# Patient Record
Sex: Male | Born: 1940 | Race: White | Hispanic: No | Marital: Married | Smoking: Former smoker
Health system: Southern US, Community
[De-identification: ages and names within clinical notes are randomized; demographics above are authoritative.]

## PROBLEM LIST (undated history)

## (undated) DIAGNOSIS — J449 Chronic obstructive pulmonary disease, unspecified: Secondary | ICD-10-CM

## (undated) DIAGNOSIS — G4700 Insomnia, unspecified: Secondary | ICD-10-CM

## (undated) DIAGNOSIS — Z720 Tobacco use: Secondary | ICD-10-CM

## (undated) DIAGNOSIS — L570 Actinic keratosis: Secondary | ICD-10-CM

## (undated) DIAGNOSIS — I10 Essential (primary) hypertension: Secondary | ICD-10-CM

## (undated) DIAGNOSIS — J45909 Unspecified asthma, uncomplicated: Secondary | ICD-10-CM

## (undated) DIAGNOSIS — J302 Other seasonal allergic rhinitis: Secondary | ICD-10-CM

## (undated) HISTORY — PX: SHOULDER SURGERY: SHX246

## (undated) HISTORY — PX: LUMBAR LAMINECTOMY: SHX95

## (undated) HISTORY — PX: INGUINAL HERNIA REPAIR: SUR1180

## (undated) HISTORY — DX: Insomnia, unspecified: G47.00

## (undated) HISTORY — DX: Tobacco use: Z72.0

## (undated) HISTORY — DX: Actinic keratosis: L57.0

## (undated) HISTORY — DX: Other seasonal allergic rhinitis: J30.2

## (undated) HISTORY — DX: Essential (primary) hypertension: I10

## (undated) HISTORY — DX: Chronic obstructive pulmonary disease, unspecified: J44.9

## (undated) HISTORY — PX: BREAST BIOPSY: SHX20

---

## 2008-04-30 ENCOUNTER — Inpatient Hospital Stay (HOSPITAL_COMMUNITY): Admission: RE | Admit: 2008-04-30 | Discharge: 2008-05-04 | Payer: Self-pay | Admitting: Neurosurgery

## 2010-07-31 LAB — CBC
MCHC: 33.4 g/dL (ref 30.0–36.0)
Platelets: 219 10*3/uL (ref 150–400)
RBC: 5.2 MIL/uL (ref 4.22–5.81)
RDW: 13 % (ref 11.5–15.5)

## 2010-07-31 LAB — BASIC METABOLIC PANEL
CO2: 28 mEq/L (ref 19–32)
Calcium: 10.2 mg/dL (ref 8.4–10.5)
Creatinine, Ser: 0.82 mg/dL (ref 0.4–1.5)
GFR calc Af Amer: >60 mL/min (ref >60)
Glucose, Bld: 92 mg/dL (ref 70–99)

## 2010-07-31 LAB — TYPE AND SCREEN
ABO/RH(D): B NEG
Antibody Screen: NEGATIVE

## 2010-08-29 NOTE — Discharge Summary (Signed)
[name], Dominic Willis              ACCOUNT NO.:  [id number]   MEDICAL RECORD NO.:  [id number]          PATIENT TYPE:  INP   LOCATION:  3007                         FACILITY:  [facility]   PHYSICIAN:  Hilda Lias, M.D.   DATE OF BIRTH:  04/10/1941   DATE OF ADMISSION:  04/30/2008  DATE OF DISCHARGE:  05/04/2008                               DISCHARGE SUMMARY   ADMISSION DIAGNOSIS:  Lumbar stenosis with neurogenic claudication.   FINAL DIAGNOSIS:  Lumbar stenosis with neurogenic claudication.   CLINICAL HISTORY:  The patient was admitted because of back pain with  radiation to both legs.  The patient get worse every time he walks.  He  gets better with he sits.  X-ray shows stenosis from L2 through L4-5.  Surgery was advised.   LABORATORY DATA:  Normal.   COURSE IN THE HOSPITAL:  The patient was taken to surgery and  decompressive laminectomy was done with posterolateral arthrodesis.  Today, he is doing much better, he is afebrile, he is having minimal  discomfort, and he is ready to go home.   CONDITION ON DISCHARGE:  Improvement.   MEDICATIONS:  Percocet, diazepam.   DIET:  Regular.   ACTIVITY:  Not to drive for a week.   Follow up to see me in 2 weeks or as needed.           ______________________________  Hilda Lias, M.D.     EB/MEDQ  D:  05/04/2008  T:  05/04/2008  Job:  [id number]

## 2010-08-29 NOTE — Op Note (Signed)
NAMESHERVIN, Willis              ACCOUNT NO.:  [id number]   MEDICAL RECORD NO.:  [id number]          PATIENT TYPE:  INP   LOCATION:  2899                         FACILITY:  [facility]   PHYSICIAN:  Hilda Lias, M.D.   DATE OF BIRTH:  08-May-1940   DATE OF PROCEDURE:  04/30/2008  DATE OF DISCHARGE:                               OPERATIVE REPORT   PREOPERATIVE DIAGNOSIS:  Lumbar stenosis with neurogenic claudication L2  through L4-5.   POSTOPERATIVE DIAGNOSIS:  Lumbar stenosis with neurogenic claudication  L2 through L4-5.   PROCEDURE:  Bilateral 2, 3, and 4 laminectomies, foraminotomies 2-3, 3-  4, 4-5.  Posterolateral arthrodesis with autograft.  Microscope.   SURGEON:  Hilda Lias, MD   CLINICAL HISTORY:  The patient is a 70 year old gentleman complaining of  back pain radiating to both legs.  X-rays showed degenerative disk  disease with stenosis at the level of 2-3, 3-4, 4-5.  Surgery was  advised.  The patient knew the risks of infection, CSF leak, worsening  pain, paralysis, need of further surgery.   PROCEDURE:  The patient was taken to the OR and after intubation, he was  positioned in prone manner.  The skin was cleaned with DuraPrep.  Midline incision from L2 to L4-5 was made.  A muscle was retracted all  the way laterally until we were able to see the takeoff of the  transverse process of 2, 3, 4 and 5.  Then with the Leksell, we removed  the spinous process of the 2, 3, and 4.  The patient had probably a  stenosis, and we had to use the drill to remove the rest of the lamina.  Bilateral laminectomy was performed.  The patient had quite a bit of  thickening of the yellow ligament, which was already calcified at this  level, the worst was at the level of L4-5.  Using the 2-3 mm Kerrison  punch as well as the drill, we were able to decompress the thecal sac at  those 3 levels.  Then using the 2 and 3 and 4 mm Kerrison punch, we did  a foraminotomy to decompress the  L2, L3, L4, and L5 nerve root.  There  was an area of adhesion mostly at the level of 4-5 to the left.  Although there was no evidence of any CSF leak, there was quite a bit of  filling of the dura matter in that area and two single stitches of  Prolene was done.  Valsalva maneuver was negative.  Having done this  with the drill, we removed the periosteum of the lateral transverse  process of 234 as well as the lateral facet.  Then the bone which was  safe from the decompression was used for arthrodesis.  Then, all of the  area was irrigated and the wound was closed with Vicryl and Steri-  Strips.           ______________________________  Hilda Lias, M.D.     EB/MEDQ  D:  04/30/2008  T:  05/01/2008  Job:  [id number]

## 2015-03-23 ENCOUNTER — Encounter: Payer: Self-pay | Admitting: *Deleted

## 2015-03-24 ENCOUNTER — Ambulatory Visit (INDEPENDENT_AMBULATORY_CARE_PROVIDER_SITE_OTHER): Payer: Medicare Other | Admitting: Cardiology

## 2015-03-24 ENCOUNTER — Encounter: Payer: Self-pay | Admitting: *Deleted

## 2015-03-24 ENCOUNTER — Encounter: Payer: Self-pay | Admitting: Cardiology

## 2015-03-24 VITALS — BP 138/74 | HR 83 | Ht 68.5 in | Wt 174.0 lb

## 2015-03-24 DIAGNOSIS — R0602 Shortness of breath: Secondary | ICD-10-CM

## 2015-03-24 DIAGNOSIS — R0789 Other chest pain: Secondary | ICD-10-CM | POA: Diagnosis not present

## 2015-03-24 DIAGNOSIS — Z136 Encounter for screening for cardiovascular disorders: Secondary | ICD-10-CM | POA: Diagnosis not present

## 2015-03-24 NOTE — Patient Instructions (Signed)
Your physician recommends that you schedule a follow-up appointment in: 6 WEEKS WITH DR BRANCH  Your physician recommends that you continue on your current medications as directed. Please refer to the Current Medication list given to you today.  Your physician has requested that you have an echocardiogram. Echocardiography is a painless test that uses sound waves to create images of your heart. It provides your doctor with information about the size and shape of your heart and how well your heart's chambers and valves are working. This procedure takes approximately one hour. There are no restrictions for this procedure.  Thank you for choosing [facility]!!   

## 2015-03-24 NOTE — Progress Notes (Signed)
Patient ID: Ponciano Lomboy, male   DOB: [birth year], 74 y.o.   MRN: [id number]     Clinical Summary Mr. Crisanto is a 74 y.o.male seen today as a new patient for the following medical problems  1. DOE/Chest pain - on inhalers, prednisone,and abx recently. Symptoms not much improved - DOE with walking up one flight of stairs. DOE with using his weedeater at 15 minutes. Limited walking at inclines as well, has to stop and rest.  - can get chest pain at times. Pressure midchest that is variable in severity, Resolves with rest. Symptoms ongoing for several years. Some increase in frequency.  - denies any LE edema, no orthopnea - reports remote history of cath 20 years ago in [place] CAD risk factors: age, HTN, tobacco  2. COPD - followed by Dr Woody Seller     Past Medical History  Diagnosis Date  . COPD (chronic obstructive pulmonary disease) ([place])   . Hypertension   . Seasonal allergies   . Actinic keratosis   . Insomnia   . Tobacco abuse      Allergies  Allergen Reactions  . Symbicort [Budesonide-Formoterol Fumarate] Other (See Comments)    headache     Current Outpatient Prescriptions  Medication Sig Dispense Refill  . albuterol (PROVENTIL) (2.5 MG/3ML) 0.083% nebulizer solution Take 2.5 mg by nebulization every 6 (six) hours as needed for wheezing or shortness of breath.    Marland Kitchen albuterol (VENTOLIN HFA) 108 (90 BASE) MCG/ACT inhaler Inhale 2 puffs into the lungs every 6 (six) hours as needed for wheezing or shortness of breath.    . ALPRAZolam (XANAX) 1 MG tablet Take 1 mg by mouth at bedtime.    . fluticasone-salmeterol (ADVAIR HFA) 115-21 MCG/ACT inhaler Inhale 2 puffs into the lungs 2 (two) times daily.    Marland Kitchen loratadine-pseudoephedrine (CLARITIN-D 12-HOUR) 5-120 MG tablet Take 1 tablet by mouth 2 (two) times daily.    Marland Kitchen losartan (COZAAR) 50 MG tablet Take 50 mg by mouth daily.    Marland Kitchen tiotropium (SPIRIVA) 18 MCG inhalation capsule Place 18 mcg into inhaler and inhale daily.    Marland Kitchen  zolpidem (AMBIEN) 5 MG tablet Take 5 mg by mouth at bedtime as needed for sleep.     No current facility-administered medications for this visit.     Past Surgical History  Procedure Laterality Date  . Shoulder surgery Left   . Breast biopsy Left   . Lumbar laminectomy      Dr. Joya Salm  . Inguinal hernia repair       Allergies  Allergen Reactions  . Symbicort [Budesonide-Formoterol Fumarate] Other (See Comments)    headache      Family History  Problem Relation Age of Onset  . Heart disease Father   . Heart disease Mother      Social History Mr. Castro reports that he has been smoking Cigarettes.  He has been smoking about 1.00 pack per day. He does not have any smokeless tobacco history on file. Mr. Merchant has no alcohol history on file.   Review of Systems CONSTITUTIONAL: No weight loss, fever, chills, weakness or fatigue.  HEENT: Eyes: No visual loss, blurred vision, double vision or yellow sclerae.No hearing loss, sneezing, congestion, runny nose or sore throat.  SKIN: No rash or itching.  CARDIOVASCULAR: per hpi RESPIRATORY: No  cough or sputum.  GASTROINTESTINAL: No anorexia, nausea, vomiting or diarrhea. No abdominal pain or blood.  GENITOURINARY: No burning on urination, no polyuria NEUROLOGICAL: No headache, dizziness, syncope, paralysis, ataxia, numbness  or tingling in the extremities. No change in bowel or bladder control.  MUSCULOSKELETAL: No muscle, back pain, joint pain or stiffness.  LYMPHATICS: No enlarged nodes. No history of splenectomy.  PSYCHIATRIC: No history of depression or anxiety.  ENDOCRINOLOGIC: No reports of sweating, cold or heat intolerance. No polyuria or polydipsia.  Marland Kitchen   Physical Examination Filed Vitals:   03/24/15 0918  BP: 138/74  Pulse: 83   Filed Vitals:   03/24/15 0918  Height: 5' 8.5" (1.74 m)  Weight: 174 lb (78.926 kg)    Gen: resting comfortably, no acute distress HEENT: no scleral icterus, pupils equal round  and reactive, no palptable cervical adenopathy,  CV: RRR, no m/r/g, no jvd Resp: Clear to auscultation bilaterally GI: abdomen is soft, non-tender, non-distended, normal bowel sounds, no hepatosplenomegaly MSK: extremities are warm, no edema.  Skin: warm, no rash Neuro:  no focal deficits Psych: appropriate affect     Assessment and Plan  1. DOE/Chest pain - will obtain echo, pending results likely obtain exercise cardiolite stress.    F/u pending test results   Arnoldo Lenis, M.D.

## 2015-04-07 ENCOUNTER — Other Ambulatory Visit: Payer: Self-pay

## 2015-04-07 ENCOUNTER — Ambulatory Visit (INDEPENDENT_AMBULATORY_CARE_PROVIDER_SITE_OTHER): Payer: Medicare Other

## 2015-04-07 DIAGNOSIS — R0602 Shortness of breath: Secondary | ICD-10-CM | POA: Diagnosis not present

## 2015-04-13 ENCOUNTER — Encounter: Payer: Self-pay | Admitting: *Deleted

## 2015-04-13 ENCOUNTER — Telehealth: Payer: Self-pay | Admitting: *Deleted

## 2015-04-13 DIAGNOSIS — R079 Chest pain, unspecified: Secondary | ICD-10-CM

## 2015-04-13 NOTE — Telephone Encounter (Signed)
Pt aware and agreeable to stress test. Orders placed and forwarded to schedulers.

## 2015-04-13 NOTE — Telephone Encounter (Signed)
-----   Message from Arnoldo Lenis, MD sent at 04/08/2015 11:29 AM EST ----- Echo overall looks good, nothing to explain his symptoms. Please order exercise nuclear stress, does not need to hold any meds  Zandra Abts MD

## 2015-04-19 DIAGNOSIS — J441 Chronic obstructive pulmonary disease with (acute) exacerbation: Secondary | ICD-10-CM | POA: Diagnosis not present

## 2015-04-19 DIAGNOSIS — R918 Other nonspecific abnormal finding of lung field: Secondary | ICD-10-CM | POA: Diagnosis not present

## 2015-04-19 DIAGNOSIS — J069 Acute upper respiratory infection, unspecified: Secondary | ICD-10-CM | POA: Diagnosis not present

## 2015-04-19 DIAGNOSIS — R05 Cough: Secondary | ICD-10-CM | POA: Diagnosis not present

## 2015-04-21 ENCOUNTER — Encounter (HOSPITAL_COMMUNITY): Payer: Medicare Other

## 2015-04-21 ENCOUNTER — Ambulatory Visit: Payer: Medicare Other | Admitting: Cardiology

## 2015-04-28 DIAGNOSIS — J449 Chronic obstructive pulmonary disease, unspecified: Secondary | ICD-10-CM | POA: Diagnosis not present

## 2015-05-12 DIAGNOSIS — R0602 Shortness of breath: Secondary | ICD-10-CM | POA: Diagnosis not present

## 2015-05-17 ENCOUNTER — Ambulatory Visit: Payer: Medicare Other | Admitting: Cardiology

## 2015-05-18 DIAGNOSIS — G4700 Insomnia, unspecified: Secondary | ICD-10-CM | POA: Diagnosis not present

## 2015-05-18 DIAGNOSIS — J449 Chronic obstructive pulmonary disease, unspecified: Secondary | ICD-10-CM | POA: Diagnosis not present

## 2015-06-10 ENCOUNTER — Encounter (HOSPITAL_COMMUNITY)
Admission: RE | Admit: 2015-06-10 | Discharge: 2015-06-10 | Disposition: A | Discharge status: Home / Self Care | Payer: Medicare Other | Source: Ambulatory Visit | Attending: Cardiology | Admitting: Cardiology

## 2015-06-10 ENCOUNTER — Encounter (HOSPITAL_COMMUNITY): Payer: Self-pay

## 2015-06-10 ENCOUNTER — Inpatient Hospital Stay (HOSPITAL_COMMUNITY): Admission: RE | Admit: 2015-06-10 | Payer: Medicare Other | Source: Ambulatory Visit

## 2015-06-10 DIAGNOSIS — R079 Chest pain, unspecified: Secondary | ICD-10-CM | POA: Diagnosis not present

## 2015-06-10 DIAGNOSIS — R931 Abnormal findings on diagnostic imaging of heart and coronary circulation: Secondary | ICD-10-CM | POA: Insufficient documentation

## 2015-06-10 HISTORY — DX: Unspecified asthma, uncomplicated: J45.909

## 2015-06-10 LAB — NM MYOCAR MULTI W/SPECT W/WALL MOTION / EF
CHL CUP NUCLEAR SDS: 0
CSEPPHR: 89 {beats}/min
LHR: 0.27
LV dias vol: 85 mL
LVSYSVOL: 38 mL
Rest HR: 77 {beats}/min
SRS: 0
SSS: 0
TID: 1.13

## 2015-06-10 MED ORDER — SODIUM CHLORIDE 0.9% FLUSH
INTRAVENOUS | Status: AC
  Administered 2015-06-10: 10 mL via INTRAVENOUS
  Filled 2015-06-10: qty 10

## 2015-06-10 MED ORDER — TECHNETIUM TC 99M SESTAMIBI - CARDIOLITE
10.0000 | Freq: Once | INTRAVENOUS | Status: AC | PRN
  Administered 2015-06-10: 9.4 via INTRAVENOUS

## 2015-06-10 MED ORDER — TECHNETIUM TC 99M SESTAMIBI GENERIC - CARDIOLITE
30.0000 | Freq: Once | INTRAVENOUS | Status: AC | PRN
  Administered 2015-06-10: 29 via INTRAVENOUS

## 2015-06-10 MED ORDER — REGADENOSON 0.4 MG/5ML IV SOLN
INTRAVENOUS | Status: AC
  Administered 2015-06-10: 0.4 mg via INTRAVENOUS
  Filled 2015-06-10: qty 5

## 2015-06-13 ENCOUNTER — Telehealth: Payer: Self-pay | Admitting: *Deleted

## 2015-06-13 NOTE — Telephone Encounter (Signed)
-----   Message from Arnoldo Lenis, MD sent at 06/13/2015  9:46 AM EST ----- Stress test looks good, no evidence of significant heart troubles as the cause of his symptoms. Would like to see him back in 1 month to discuss findings in detail and f/u symptoms  Zandra Abts MD

## 2015-06-13 NOTE — Telephone Encounter (Signed)
Pt aware, confirmed 3/3 appt, routed to pcp

## 2015-06-17 ENCOUNTER — Encounter: Payer: Self-pay | Admitting: Cardiology

## 2015-06-17 ENCOUNTER — Ambulatory Visit (INDEPENDENT_AMBULATORY_CARE_PROVIDER_SITE_OTHER): Payer: Medicare Other | Admitting: Cardiology

## 2015-06-17 VITALS — BP 163/91 | HR 79 | Ht 68.5 in | Wt 183.0 lb

## 2015-06-17 DIAGNOSIS — R0600 Dyspnea, unspecified: Secondary | ICD-10-CM | POA: Diagnosis not present

## 2015-06-17 NOTE — Progress Notes (Signed)
Patient ID: Dominic Willis, male   DOB: 09/11/1940, 75 y.o.   MRN: [id number]     Clinical Summary Dominic Willis is a 75 y.o.male seen today for follow up of the following medical problems.   1. DOE/Chest pain - on inhalers, prednisone,and abx recently. Symptoms not much improved - DOE with walking up one flight of stairs. DOE with using his weedeater at 15 minutes. Limited walking at inclines as well, has to stop and rest.  - can get chest pain at times. Pressure midchest that is variable in severity, Resolves with rest. Symptoms ongoing for several years. Some increase in frequency.    - since last visit completed a nuclear stress test that showed no clear ischemia, echo with LVEF [id number], grade I diastolic dysfunction.  - breathing somewhat better since he stopped smoking. Cough improved since last visit.   2. COPD - followed by Dominic Willis Past Medical History  Diagnosis Date  . COPD (chronic obstructive pulmonary disease) ([place])   . Hypertension   . Seasonal allergies   . Actinic keratosis   . Insomnia   . Tobacco abuse   . Asthma      Allergies  Allergen Reactions  . Symbicort [Budesonide-Formoterol Fumarate] Other (See Comments)    headache     Current Outpatient Prescriptions  Medication Sig Dispense Refill  . albuterol (PROVENTIL) (2.5 MG/3ML) 0.083% nebulizer solution Take 2.5 mg by nebulization every 6 (six) hours as needed for wheezing or shortness of breath.    Marland Kitchen albuterol (VENTOLIN HFA) 108 (90 BASE) MCG/ACT inhaler Inhale 2 puffs into the lungs every 6 (six) hours as needed for wheezing or shortness of breath.    . ALPRAZolam (XANAX) 1 MG tablet Take 1 mg by mouth at bedtime.    Marland Kitchen aspirin EC 81 MG tablet Take 81 mg by mouth daily.    . citalopram (CELEXA) 10 MG tablet Take 1 tablet by mouth daily.    Marland Kitchen doxycycline (VIBRA-TABS) 100 MG tablet Take 1 tablet by mouth daily.  5  . fluticasone-salmeterol (ADVAIR HFA) 115-21 MCG/ACT inhaler Inhale 2 puffs into the lungs  2 (two) times daily.    Marland Kitchen loratadine-pseudoephedrine (CLARITIN-D 12-HOUR) 5-120 MG tablet Take 1 tablet by mouth 2 (two) times daily as needed.     Marland Kitchen losartan (COZAAR) 50 MG tablet Take 50 mg by mouth daily.    Marland Kitchen tiotropium (SPIRIVA) 18 MCG inhalation capsule Place 18 mcg into inhaler and inhale daily.    Marland Kitchen zolpidem (AMBIEN) 5 MG tablet Take 5 mg by mouth at bedtime.      No current facility-administered medications for this visit.     Past Surgical History  Procedure Laterality Date  . Shoulder surgery Left   . Breast biopsy Left   . Lumbar laminectomy      Dominic Willis  . Inguinal hernia repair       Allergies  Allergen Reactions  . Symbicort [Budesonide-Formoterol Fumarate] Other (See Comments)    headache      Family History  Problem Relation Age of Onset  . Heart disease Father   . Heart disease Mother      Social History Dominic Willis reports that he has been smoking Cigarettes.  He started smoking about 57 years ago. He has been smoking about 1.00 pack per day. He has never used smokeless tobacco. Dominic Willis has no alcohol history on file.   Review of Systems CONSTITUTIONAL: No weight loss, fever, chills, weakness or fatigue.  HEENT: Eyes: No  visual loss, blurred vision, double vision or yellow sclerae.No hearing loss, sneezing, congestion, runny nose or sore throat.  SKIN: No rash or itching.  CARDIOVASCULAR: per HPI RESPIRATORY: per HPI GASTROINTESTINAL: No anorexia, nausea, vomiting or diarrhea. No abdominal pain or blood.  GENITOURINARY: No burning on urination, no polyuria NEUROLOGICAL: No headache, dizziness, syncope, paralysis, ataxia, numbness or tingling in the extremities. No change in bowel or bladder control.  MUSCULOSKELETAL: No muscle, back pain, joint pain or stiffness.  LYMPHATICS: No enlarged nodes. No history of splenectomy.  PSYCHIATRIC: No history of depression or anxiety.  ENDOCRINOLOGIC: No reports of sweating, cold or heat intolerance. No  polyuria or polydipsia.  Marland Kitchen   Physical Examination Filed Vitals:   06/17/15 1332  BP: 163/91  Pulse: 79   Filed Vitals:   06/17/15 1332  Height: 5' 8.5" (1.74 m)  Weight: 183 lb (83.008 kg)    Gen: resting comfortably, no acute distress HEENT: no scleral icterus, pupils equal round and reactive, no palptable cervical adenopathy,  CV: RRR, no m/r/g, no jvd Resp: Clear to auscultation bilaterally GI: abdomen is soft, non-tender, non-distended, normal bowel sounds, no hepatosplenomegaly MSK: extremities are warm, no edema.  Skin: warm, no rash Neuro:  no focal deficits Psych: appropriate affect   Diagnostic Studies 03/2015 echo Study Conclusions  - Procedure narrative: Transthoracic echocardiography for left ventricular function evaluation, for right ventricular function evaluation, and for assessment of valvular function. Image quality was adequate. The study was technically difficult, as a result of poor sound wave transmission. - Left ventricle: The cavity size was normal. There was mild concentric hypertrophy. Systolic function was normal. The estimated ejection fraction was in the range of 60% to 65%. Images were inadequate for LV wall motion assessment. However, no gross regional variation on available images including apical 4-chamber views. Doppler parameters are consistent with abnormal left ventricular relaxation (grade 1 diastolic dysfunction).   05/2015 MPI  Diffuse resting ST segment abnormalities.  Fixed inferior wall defect extending from apex to base with some inferoseptal involvement which is likely due to soft tissue attenuation artifact. No ischemic territories.  This is a low risk study.  Nuclear stress EF: 55%.   Assessment and Plan   1. DOE - appears to be primarily pulmonary related, cardiac testing including stress test and echo fairly unremarkable - contiue COPD management per pcp  F/u 1 year   Dominic Willis, M.D.

## 2015-06-17 NOTE — Patient Instructions (Signed)
Continue all current medications. Your physician wants you to follow up in:  1 year.  You will receive a reminder letter in the mail one-two months in advance.  If you don't receive a letter, please call our office to schedule the follow up appointment   

## 2015-06-20 DIAGNOSIS — J449 Chronic obstructive pulmonary disease, unspecified: Secondary | ICD-10-CM | POA: Diagnosis not present

## 2015-06-20 DIAGNOSIS — I10 Essential (primary) hypertension: Secondary | ICD-10-CM | POA: Diagnosis not present

## 2015-06-21 ENCOUNTER — Encounter: Payer: Self-pay | Admitting: *Deleted

## 2015-07-18 DIAGNOSIS — L570 Actinic keratosis: Secondary | ICD-10-CM | POA: Diagnosis not present

## 2015-07-18 DIAGNOSIS — L710 Perioral dermatitis: Secondary | ICD-10-CM | POA: Diagnosis not present

## 2015-07-27 DIAGNOSIS — I10 Essential (primary) hypertension: Secondary | ICD-10-CM | POA: Diagnosis not present

## 2015-07-27 DIAGNOSIS — J449 Chronic obstructive pulmonary disease, unspecified: Secondary | ICD-10-CM | POA: Diagnosis not present

## 2015-08-29 DIAGNOSIS — I10 Essential (primary) hypertension: Secondary | ICD-10-CM | POA: Diagnosis not present

## 2015-08-29 DIAGNOSIS — J449 Chronic obstructive pulmonary disease, unspecified: Secondary | ICD-10-CM | POA: Diagnosis not present

## 2015-09-01 DIAGNOSIS — J441 Chronic obstructive pulmonary disease with (acute) exacerbation: Secondary | ICD-10-CM | POA: Diagnosis not present

## 2015-09-01 DIAGNOSIS — Z87891 Personal history of nicotine dependence: Secondary | ICD-10-CM | POA: Diagnosis not present

## 2015-09-01 DIAGNOSIS — Z6828 Body mass index (BMI) 28.0-28.9, adult: Secondary | ICD-10-CM | POA: Diagnosis not present

## 2015-09-01 DIAGNOSIS — R600 Localized edema: Secondary | ICD-10-CM | POA: Diagnosis not present

## 2015-09-01 DIAGNOSIS — G4700 Insomnia, unspecified: Secondary | ICD-10-CM | POA: Diagnosis not present

## 2015-09-01 DIAGNOSIS — I10 Essential (primary) hypertension: Secondary | ICD-10-CM | POA: Diagnosis not present

## 2015-09-06 DIAGNOSIS — H52209 Unspecified astigmatism, unspecified eye: Secondary | ICD-10-CM | POA: Diagnosis not present

## 2015-09-06 DIAGNOSIS — H353131 Nonexudative age-related macular degeneration, bilateral, early dry stage: Secondary | ICD-10-CM | POA: Diagnosis not present

## 2015-09-06 DIAGNOSIS — H2513 Age-related nuclear cataract, bilateral: Secondary | ICD-10-CM | POA: Diagnosis not present

## 2015-09-06 DIAGNOSIS — H40033 Anatomical narrow angle, bilateral: Secondary | ICD-10-CM | POA: Diagnosis not present

## 2015-10-13 DIAGNOSIS — J449 Chronic obstructive pulmonary disease, unspecified: Secondary | ICD-10-CM | POA: Diagnosis not present

## 2015-10-13 DIAGNOSIS — I10 Essential (primary) hypertension: Secondary | ICD-10-CM | POA: Diagnosis not present

## 2015-10-19 DIAGNOSIS — Z299 Encounter for prophylactic measures, unspecified: Secondary | ICD-10-CM | POA: Diagnosis not present

## 2015-10-19 DIAGNOSIS — J069 Acute upper respiratory infection, unspecified: Secondary | ICD-10-CM | POA: Diagnosis not present

## 2015-10-19 DIAGNOSIS — J449 Chronic obstructive pulmonary disease, unspecified: Secondary | ICD-10-CM | POA: Diagnosis not present

## 2015-10-19 DIAGNOSIS — Z87891 Personal history of nicotine dependence: Secondary | ICD-10-CM | POA: Diagnosis not present

## 2015-10-28 DIAGNOSIS — J449 Chronic obstructive pulmonary disease, unspecified: Secondary | ICD-10-CM | POA: Diagnosis not present

## 2015-10-28 DIAGNOSIS — I10 Essential (primary) hypertension: Secondary | ICD-10-CM | POA: Diagnosis not present

## 2015-11-08 DIAGNOSIS — G4700 Insomnia, unspecified: Secondary | ICD-10-CM | POA: Diagnosis not present

## 2015-11-08 DIAGNOSIS — I10 Essential (primary) hypertension: Secondary | ICD-10-CM | POA: Diagnosis not present

## 2015-11-08 DIAGNOSIS — J441 Chronic obstructive pulmonary disease with (acute) exacerbation: Secondary | ICD-10-CM | POA: Diagnosis not present

## 2015-11-28 DIAGNOSIS — J440 Chronic obstructive pulmonary disease with acute lower respiratory infection: Secondary | ICD-10-CM | POA: Diagnosis not present

## 2015-11-28 DIAGNOSIS — I10 Essential (primary) hypertension: Secondary | ICD-10-CM | POA: Diagnosis not present

## 2015-11-28 DIAGNOSIS — J441 Chronic obstructive pulmonary disease with (acute) exacerbation: Secondary | ICD-10-CM | POA: Diagnosis not present

## 2015-11-28 DIAGNOSIS — R600 Localized edema: Secondary | ICD-10-CM | POA: Diagnosis not present

## 2015-11-28 DIAGNOSIS — R0602 Shortness of breath: Secondary | ICD-10-CM | POA: Diagnosis not present

## 2015-11-28 DIAGNOSIS — K219 Gastro-esophageal reflux disease without esophagitis: Secondary | ICD-10-CM | POA: Diagnosis not present

## 2015-11-28 DIAGNOSIS — J209 Acute bronchitis, unspecified: Secondary | ICD-10-CM | POA: Diagnosis not present

## 2015-11-28 DIAGNOSIS — Z79899 Other long term (current) drug therapy: Secondary | ICD-10-CM | POA: Diagnosis not present

## 2015-11-29 DIAGNOSIS — J441 Chronic obstructive pulmonary disease with (acute) exacerbation: Secondary | ICD-10-CM | POA: Diagnosis not present

## 2015-11-29 DIAGNOSIS — I10 Essential (primary) hypertension: Secondary | ICD-10-CM | POA: Diagnosis not present

## 2015-11-29 DIAGNOSIS — K219 Gastro-esophageal reflux disease without esophagitis: Secondary | ICD-10-CM | POA: Diagnosis not present

## 2015-11-29 DIAGNOSIS — J440 Chronic obstructive pulmonary disease with acute lower respiratory infection: Secondary | ICD-10-CM | POA: Diagnosis not present

## 2015-11-29 DIAGNOSIS — J209 Acute bronchitis, unspecified: Secondary | ICD-10-CM | POA: Diagnosis not present

## 2015-11-29 DIAGNOSIS — M7121 Synovial cyst of popliteal space [Baker], right knee: Secondary | ICD-10-CM | POA: Diagnosis not present

## 2015-11-29 DIAGNOSIS — R0602 Shortness of breath: Secondary | ICD-10-CM | POA: Diagnosis not present

## 2015-11-30 DIAGNOSIS — R0602 Shortness of breath: Secondary | ICD-10-CM | POA: Diagnosis not present

## 2015-11-30 DIAGNOSIS — J209 Acute bronchitis, unspecified: Secondary | ICD-10-CM | POA: Diagnosis not present

## 2015-11-30 DIAGNOSIS — J441 Chronic obstructive pulmonary disease with (acute) exacerbation: Secondary | ICD-10-CM | POA: Diagnosis not present

## 2015-11-30 DIAGNOSIS — I10 Essential (primary) hypertension: Secondary | ICD-10-CM | POA: Diagnosis not present

## 2015-11-30 DIAGNOSIS — K219 Gastro-esophageal reflux disease without esophagitis: Secondary | ICD-10-CM | POA: Diagnosis not present

## 2015-11-30 DIAGNOSIS — J440 Chronic obstructive pulmonary disease with acute lower respiratory infection: Secondary | ICD-10-CM | POA: Diagnosis not present

## 2015-12-08 DIAGNOSIS — J441 Chronic obstructive pulmonary disease with (acute) exacerbation: Secondary | ICD-10-CM | POA: Diagnosis not present

## 2015-12-08 DIAGNOSIS — Z09 Encounter for follow-up examination after completed treatment for conditions other than malignant neoplasm: Secondary | ICD-10-CM | POA: Diagnosis not present

## 2015-12-26 ENCOUNTER — Ambulatory Visit (INDEPENDENT_AMBULATORY_CARE_PROVIDER_SITE_OTHER): Payer: Medicare Other | Admitting: Pulmonary Disease

## 2015-12-26 ENCOUNTER — Encounter (INDEPENDENT_AMBULATORY_CARE_PROVIDER_SITE_OTHER): Payer: Self-pay

## 2015-12-26 ENCOUNTER — Encounter: Payer: Self-pay | Admitting: Pulmonary Disease

## 2015-12-26 VITALS — BP 118/70 | HR 91 | Ht 68.5 in | Wt 180.4 lb

## 2015-12-26 DIAGNOSIS — J441 Chronic obstructive pulmonary disease with (acute) exacerbation: Secondary | ICD-10-CM | POA: Insufficient documentation

## 2015-12-26 DIAGNOSIS — R0602 Shortness of breath: Secondary | ICD-10-CM | POA: Diagnosis not present

## 2015-12-26 DIAGNOSIS — F17200 Nicotine dependence, unspecified, uncomplicated: Secondary | ICD-10-CM

## 2015-12-26 DIAGNOSIS — Z87891 Personal history of nicotine dependence: Secondary | ICD-10-CM | POA: Insufficient documentation

## 2015-12-26 MED ORDER — BUDESONIDE 0.25 MG/2ML IN SUSP: 0.2500 mg | Freq: Two times a day (BID) | RESPIRATORY_TRACT | 2 refills | Status: DC

## 2015-12-26 MED ORDER — PREDNISONE 10 MG PO TABS: 20.0000 mg | ORAL_TABLET | Freq: Every day | ORAL | 0 refills | Status: DC

## 2015-12-26 MED ORDER — BUDESONIDE 0.25 MG/2ML IN SUSP: 0.2500 mg | Freq: Two times a day (BID) | RESPIRATORY_TRACT | 3 refills | Status: DC

## 2015-12-26 NOTE — Patient Instructions (Signed)
Stop smoking Take the prednisone 5 days Start taking Brovana and Pulmicort When you have Brovana and Pulmicort, take them twice a day no matter how you feel Keep taking Spiriva Use albuterol as needed for shortness of breath Stop taking Advair when you have received Brovana and Pulmicort We will see you back in one week to make sure things are headed in the right direction

## 2015-12-26 NOTE — Assessment & Plan Note (Addendum)
Today's lung function testing shows severe airflow obstruction consistent with COPD. He is having another exacerbation today.  I reviewed the images from his CT chest from last month which showed no evidence of a pulmonary embolism but moderate to severe centrilobular emphysema. I counseled him today that his dyspnea is due primarily to his advanced COPD as well his his deconditioning. We also talked about the significance of his ongoing tobacco use which is contributing to the severity of his disease.  Plan: Prednisone 5 days Stop Advair Start Brovana and Pulmicort Use Spiriva Use albuterol as needed Quit smoking F/U 7 days, will need referral

## 2015-12-26 NOTE — Progress Notes (Signed)
Subjective:    Patient ID: Dominic Willis, male    DOB: 04-Nov-1940, 75 y.o.   MRN: [id number]  HPI Chief Complaint  Patient presents with  . Advice Only    Referred by Dr. Woody Seller for copd.    Dominic Willis is here to see me for shortness of breath.  He was hospitalized last month for a COPD exacerbation and was sent home on prednisone, Advair, and Spiriva > see details below.  He was prescribed oxygen to use in the night after he left the hospital.  He has been using this one during the daytime often due to dyspnea.  He notes that he can only walk about 125 feet, and he has to stop twice while walking this distance.  Just talking is making him dyspneic.  He has been told that he has COPD and emphysema for years and has been on Spiriva and Advair for years in addition to albuterol prn.  He has been using albuterol 3-4 times a day for the last month, but in the year prior to that it was bout two times a day.   He notes that he will develop substernal chest pain and will then use albuterol which makes him cough up clear to yellow mucus which makes him breathe better.  This brief respit form dyspnea will only last for a few minutes, then he has problems with dyspnea again.  Albuterol definitely helps when he takes it.    In the last year he has not been able to exercise much due to severe dyspnea. His wife notes that his dyspnea has been worse since June 2017.  There were no changes in his living environment but they have dealt with the loss of their son a year ago and a dog recently.  In January he saw Dr. Harl Bowie with cardiology where his LVEF was noted to be 55% with no clear ischemic changes on a nuclear stress test and an echocardiogram was normal as well.    Past Medical History:  Diagnosis Date  . Actinic keratosis   . Asthma   . COPD (chronic obstructive pulmonary disease) ([place])   . Hypertension   . Insomnia   . Seasonal allergies   . Tobacco abuse      Family History  Problem  Relation Age of Onset  . Heart disease Father   . Heart disease Mother      Social History   Social History  . Marital status: Married    Spouse name: N/A  . Number of children: N/A  . Years of education: N/A   Occupational History  . Not on file.   Social History Main Topics  . Smoking status: Current Every Day Smoker    Packs/day: 1.00    Years: 57.00    Types: Cigarettes    Start date: 03/27/1958  . Smokeless tobacco: Never Used     Comment: currently smoking 0.5ppd  . Alcohol use Not on file  . Drug use: Unknown  . Sexual activity: Not on file   Other Topics Concern  . Not on file   Social History Narrative  . No narrative on file     Allergies  Allergen Reactions  . Symbicort [Budesonide-Formoterol Fumarate] Other (See Comments)    headache     Outpatient Medications Prior to Visit  Medication Sig Dispense Refill  . albuterol (PROVENTIL) (2.5 MG/3ML) 0.083% nebulizer solution Take 2.5 mg by nebulization every 6 (six) hours as needed for wheezing or shortness of  breath.    . ALPRAZolam (XANAX) 1 MG tablet Take 1 mg by mouth at bedtime.    Marland Kitchen aspirin EC 81 MG tablet Take 81 mg by mouth daily.    Marland Kitchen losartan (COZAAR) 50 MG tablet Take 50 mg by mouth daily.    Marland Kitchen tiotropium (SPIRIVA) 18 MCG inhalation capsule Place 18 mcg into inhaler and inhale daily.    . fluticasone-salmeterol (ADVAIR HFA) 115-21 MCG/ACT inhaler Inhale 2 puffs into the lungs 2 (two) times daily.    Marland Kitchen albuterol (VENTOLIN HFA) 108 (90 BASE) MCG/ACT inhaler Inhale 2 puffs into the lungs every 6 (six) hours as needed for wheezing or shortness of breath.    . citalopram (CELEXA) 10 MG tablet Take 1 tablet by mouth daily.    Marland Kitchen loratadine-pseudoephedrine (CLARITIN-D 12-HOUR) 5-120 MG tablet Take 1 tablet by mouth 2 (two) times daily as needed.      No facility-administered medications prior to visit.       Review of Systems  Constitutional: Negative for fever and unexpected weight change.  HENT:  Positive for congestion. Negative for dental problem, ear pain, nosebleeds, postnasal drip, rhinorrhea, sinus pressure, sneezing, sore throat and trouble swallowing.   Eyes: Negative for redness and itching.  Respiratory: Positive for cough, chest tightness, shortness of breath and wheezing.   Cardiovascular: Negative for palpitations and leg swelling.  Gastrointestinal: Negative for nausea and vomiting.  Genitourinary: Negative for dysuria.  Musculoskeletal: Negative for joint swelling.  Skin: Negative for rash.  Neurological: Negative for headaches.  Hematological: Does not bruise/bleed easily.  Psychiatric/Behavioral: Negative for dysphoric mood. The patient is not nervous/anxious.        Objective:   Physical Exam Vitals:   12/26/15 1356  BP: 118/70  Pulse: 91  SpO2: 99%  Weight: 180 lb 6.4 oz (81.8 kg)  Height: 5' 8.5" (1.74 m)  RA  Gen: chronically ill appearing, no acute distress HENT: NCAT, OP clear, neck supple without masses Eyes: PERRL, EOMi Lymph: no cervical lymphadenopathy PULM: Wheezing bilaterally, poor air movement CV: RRR, no mgr, no JVD GI: BS+, soft, nontender, no hsm Derm: no rash or skin breakdown MSK: normal bulk and tone Neuro: A&Ox4, CN II-XII intact, strength 5/5 in all 4 extremities Psyche: normal mood and affect    September 2017 simple spirometry clear airflow obstruction, FEV1 0.92 L (32% protected)  August 2017 CT chest images personally reviewed showing no pulmonary embolism but significant upper lobe centrilobular emphysema  Records sent for this consultation from [facility] show a proBNP level within normal limits in August 2017, d-dimer slightly elevated August 2017, H&P for a hospital admission in August 2017 for COPD exacerbation which note continued tobacco use and an allergy to Symbicort This record also indicates that a pulmonary function test was performed in August 2013 showing an FEV1 of 55% predicted, these records also  indicate that a chest x-ray was performed on August 2017 which showed COPD and chronic bronchitis, Advair and Spiriva prescribed at discharge along with pantoprazole and a prednisone taper    Assessment & Plan:  COPD exacerbation ([place]) Today's lung function testing shows severe airflow obstruction consistent with COPD. He is having another exacerbation today.  I reviewed the images from his CT chest from last month which showed no evidence of a pulmonary embolism but moderate to severe centrilobular emphysema. I counseled him today that his dyspnea is due primarily to his advanced COPD as well his his deconditioning. We also talked about the significance of his ongoing  tobacco use which is contributing to the severity of his disease.  Plan: Prednisone 5 days Stop Advair Start Brovana and Pulmicort Use Spiriva Use albuterol as needed Quit smoking F/U 7 days, will need referral  Tobacco use disorder Counseled at length to quit today, would qualify for lung cancer screening, discuss next week    Current Outpatient Prescriptions:  .  albuterol (PROVENTIL) (2.5 MG/3ML) 0.083% nebulizer solution, Take 2.5 mg by nebulization every 6 (six) hours as needed for wheezing or shortness of breath., Disp: , Rfl:  .  ALPRAZolam (XANAX) 1 MG tablet, Take 1 mg by mouth at bedtime., Disp: , Rfl:  .  aspirin EC 81 MG tablet, Take 81 mg by mouth daily., Disp: , Rfl:  .  doxycycline (VIBRAMYCIN) 100 MG capsule, Take 100 mg by mouth daily., Disp: , Rfl:  .  loratadine-pseudoephedrine (CLARITIN-D 12-HOUR) 5-120 MG tablet, Take 1 tablet by mouth 2 (two) times daily., Disp: , Rfl:  .  losartan (COZAAR) 50 MG tablet, Take 50 mg by mouth daily., Disp: , Rfl:  .  pantoprazole (PROTONIX) 40 MG tablet, Take 40 mg by mouth daily., Disp: , Rfl:  .  tiotropium (SPIRIVA) 18 MCG inhalation capsule, Place 18 mcg into inhaler and inhale daily., Disp: , Rfl:  .  predniSONE (DELTASONE) 10 MG tablet, Take 2 tablets (20 mg  total) by mouth daily with breakfast., Disp: 10 tablet, Rfl: 0

## 2015-12-26 NOTE — Assessment & Plan Note (Signed)
Counseled at length to quit today, would qualify for lung cancer screening, discuss next week

## 2015-12-26 NOTE — Addendum Note (Signed)
Addended by: Osa Craver on: 12/26/2015 03:02 PM   Modules accepted: Orders

## 2016-01-02 ENCOUNTER — Ambulatory Visit (INDEPENDENT_AMBULATORY_CARE_PROVIDER_SITE_OTHER)
Admission: RE | Admit: 2016-01-02 | Discharge: 2016-01-02 | Disposition: A | Discharge status: Home / Self Care | Payer: Medicare Other | Source: Ambulatory Visit | Attending: Adult Health | Admitting: Adult Health

## 2016-01-02 ENCOUNTER — Telehealth: Payer: Self-pay | Admitting: Adult Health

## 2016-01-02 ENCOUNTER — Encounter: Payer: Self-pay | Admitting: Adult Health

## 2016-01-02 ENCOUNTER — Other Ambulatory Visit (INDEPENDENT_AMBULATORY_CARE_PROVIDER_SITE_OTHER): Payer: Medicare Other

## 2016-01-02 ENCOUNTER — Ambulatory Visit (INDEPENDENT_AMBULATORY_CARE_PROVIDER_SITE_OTHER): Payer: Medicare Other | Admitting: Adult Health

## 2016-01-02 VITALS — BP 132/82 | HR 83 | Temp 97.7°F | Ht 68.0 in | Wt 181.0 lb

## 2016-01-02 DIAGNOSIS — J449 Chronic obstructive pulmonary disease, unspecified: Secondary | ICD-10-CM

## 2016-01-02 DIAGNOSIS — J441 Chronic obstructive pulmonary disease with (acute) exacerbation: Secondary | ICD-10-CM

## 2016-01-02 DIAGNOSIS — R0600 Dyspnea, unspecified: Secondary | ICD-10-CM | POA: Diagnosis not present

## 2016-01-02 DIAGNOSIS — R05 Cough: Secondary | ICD-10-CM | POA: Diagnosis not present

## 2016-01-02 DIAGNOSIS — R0602 Shortness of breath: Secondary | ICD-10-CM | POA: Diagnosis not present

## 2016-01-02 LAB — CBC WITH DIFFERENTIAL/PLATELET
BASOS ABS: 0 10*3/uL (ref 0.0–0.1)
Basophils Relative: 0.4 % (ref 0.0–3.0)
EOS ABS: 0.4 10*3/uL (ref 0.0–0.7)
Eosinophils Relative: 3.6 % (ref 0.0–5.0)
HEMATOCRIT: 41.2 % (ref 39.0–52.0)
HEMOGLOBIN: 14.3 g/dL (ref 13.0–17.0)
LYMPHS PCT: 20.8 % (ref 12.0–46.0)
Lymphs Abs: 2 10*3/uL (ref 0.7–4.0)
MCHC: 34.6 g/dL (ref 30.0–36.0)
MCV: 92.1 fl (ref 78.0–100.0)
MONOS PCT: 7.5 % (ref 3.0–12.0)
Monocytes Absolute: 0.7 10*3/uL (ref 0.1–1.0)
NEUTROS PCT: 67.7 % (ref 43.0–77.0)
Neutro Abs: 6.7 10*3/uL (ref 1.4–7.7)
PLATELETS: 309 10*3/uL (ref 150.0–400.0)
RBC: 4.48 Mil/uL (ref 4.22–5.81)
RDW: 14.5 % (ref 11.5–15.5)
WBC: 9.8 10*3/uL (ref 4.0–10.5)

## 2016-01-02 LAB — BASIC METABOLIC PANEL
BUN: 15 mg/dL (ref 6–23)
CALCIUM: 9 mg/dL (ref 8.4–10.5)
CO2: 31 mEq/L (ref 19–32)
Chloride: 98 mEq/L (ref 96–112)
Creatinine, Ser: 1 mg/dL (ref 0.40–1.50)
GFR: 77.26 mL/min (ref >60.00)
GLUCOSE: 107 mg/dL — AB (ref 70–99)
POTASSIUM: 3.8 meq/L (ref 3.5–5.1)
SODIUM: 136 meq/L (ref 135–145)

## 2016-01-02 LAB — BRAIN NATRIURETIC PEPTIDE: PRO B NATRI PEPTIDE: 50 pg/mL (ref 0.0–100.0)

## 2016-01-02 MED ORDER — ARFORMOTEROL TARTRATE 15 MCG/2ML IN NEBU: 15.0000 ug | INHALATION_SOLUTION | Freq: Two times a day (BID) | RESPIRATORY_TRACT | 5 refills | Status: DC

## 2016-01-02 MED ORDER — PREDNISONE 10 MG PO TABS: ORAL_TABLET | ORAL | 0 refills | Status: DC

## 2016-01-02 NOTE — Progress Notes (Signed)
Subjective:    Patient ID: Dominic Willis, male    DOB: [birth year], 75 y.o.   MRN: [id number]  HPI 75 year old smoker seen for pulmonary consult 12/26/15 for COPD   TEST  September 2017 simple spirometry severe airflow obstruction, FEV1 0.92 L (32% predicted)  August 2017 CT chest images personally reviewed showing no pulmonary embolism but significant upper lobe centrilobular emphysema Venous doppler neg for DVT   Records sent for this consultation from [facility] show a proBNP level within normal limits in August 2017, d-dimer slightly elevated August 2017, H&P for a hospital admission in August 2017 for COPD exacerbation which note continued tobacco use and an allergy to Symbicort This record also indicates that a pulmonary function test was performed in August 2013 showing an FEV1 of 55% predicted  01/02/2016 follow-up COPD Patient was returns for a follow-up visit. Patient was seen last week for COPD exacerbation. He was changed to Brovana and Pulmicort from [place]. Continued on Spiriva. And given a 5 day course of prednisone. Patient returns today reporting that he has only had minimal improvement in symptoms. Did feel better when he was on prednisone. Unfortunately, did only get Pulmicort nebulizer. And did not get a start on Brovana. Unfortunately continues to smoke. We discussed smoking cessation. Patient denies any chest pain, orthopnea, PND, or increased leg swelling   Past Medical History:  Diagnosis Date  . Actinic keratosis   . Asthma   . COPD (chronic obstructive pulmonary disease) ([place])   . Hypertension   . Insomnia   . Seasonal allergies   . Tobacco abuse    Current Outpatient Prescriptions on File Prior to Visit  Medication Sig Dispense Refill  . albuterol (PROVENTIL) (2.5 MG/3ML) 0.083% nebulizer solution Take 2.5 mg by nebulization every 6 (six) hours as needed for wheezing or shortness of breath.    . ALPRAZolam (XANAX) 1 MG tablet Take 1 mg by mouth  at bedtime.    Marland Kitchen aspirin EC 81 MG tablet Take 81 mg by mouth daily.    . budesonide (PULMICORT) 0.25 MG/2ML nebulizer solution Take 2 mLs (0.25 mg total) by nebulization 2 (two) times daily. 60 mL 2  . doxycycline (VIBRAMYCIN) 100 MG capsule Take 100 mg by mouth daily.    Marland Kitchen loratadine-pseudoephedrine (CLARITIN-D 12-HOUR) 5-120 MG tablet Take 1 tablet by mouth 2 (two) times daily.    Marland Kitchen losartan (COZAAR) 50 MG tablet Take 50 mg by mouth daily.    . pantoprazole (PROTONIX) 40 MG tablet Take 40 mg by mouth daily.    Marland Kitchen tiotropium (SPIRIVA) 18 MCG inhalation capsule Place 18 mcg into inhaler and inhale daily.     No current facility-administered medications on file prior to visit.      Review of Systems Constitutional:   No  weight loss, night sweats,  Fevers, chills,  +fatigue, or  lassitude.  HEENT:   No headaches,  Difficulty swallowing,  Tooth/dental problems, or  Sore throat,                No sneezing, itching, ear ache, nasal congestion, post nasal drip,   CV:  No chest pain,  Orthopnea, PND, swelling in lower extremities, anasarca, dizziness, palpitations, syncope.   GI  No heartburn, indigestion, abdominal pain, nausea, vomiting, diarrhea, change in bowel habits, loss of appetite, bloody stools.   Resp:   No chest wall deformity  Skin: no rash or lesions.  GU: no dysuria, change in color of urine, no urgency or frequency.  No  flank pain, no hematuria   MS:  No joint pain or swelling.  No decreased range of motion.  No back pain.  Psych:  No change in mood or affect. No depression or anxiety.  No memory loss.         Objective:   Physical Exam Vitals:   01/02/16 1224  BP: 132/82  Pulse: 83  Temp: 97.7 F (36.5 C)  TempSrc: Oral  SpO2: 97%  Weight: 181 lb (82.1 kg)  Height: 5\' 8"  (1.727 m)   GEN: A/Ox3; pleasant , NAD, elderly    HEENT:  [place]/AT,  EACs-clear, TMs-wnl, NOSE-clear, THROAT-clear, no lesions, no postnasal drip or exudate noted.   NECK:  Supple w/  fair ROM; no JVD; normal carotid impulses w/o bruits; no thyromegaly or nodules palpated; no lymphadenopathy.    RESP  decreased breath sounds in the bases  no accessory muscle use, no dullness to percussion  CARD:  RRR, no m/r/g  , no peripheral edema, pulses intact, no cyanosis or clubbing.  GI:   Soft & nt; nml bowel sounds; no organomegaly or masses detected.   Musco: Warm bil, no deformities or joint swelling noted.   Neuro: alert, no focal deficits noted.    Skin: Warm, no lesions or rashes  Marquasha Brutus NP-C  [facility]  01/02/2016        Assessment & Plan:

## 2016-01-02 NOTE — Assessment & Plan Note (Addendum)
Slow to resolve flare  Check cxr today  Check labs with bnp,cbc and bmet   Plan  Patient Instructions  Begin Brovana Neb Twice daily   Continue on Budesonide Neb Twice daily   Prednisone taper over next week.  USe spiriva daily  Chest xray and labs today  Please contact office for sooner follow up if symptoms do not improve or worsen or seek emergency care  follow up Dr. Lake Bells in 6 weeks and .As needed

## 2016-01-02 NOTE — Telephone Encounter (Signed)
Prednisone taper was not sent to pharmacy after visit today. I apologized for the inconvenience and this has been called into [facility] in [place]. Per TP call in Pred taper 8-day (4x2, 3x2, 2x2, 1x2 stop #20) Nothing further needed.  Instructions   Begin Brovana Neb Twice daily   Continue on Budesonide Neb Twice daily   Prednisone taper over next week.  USe spiriva daily  Chest xray and labs today  Please contact office for sooner follow up if symptoms do not improve or worsen or seek emergency care  follow up Dr. Lake Bells in 6 weeks and .As needed

## 2016-01-02 NOTE — Patient Instructions (Signed)
Begin Brovana Neb Twice daily   Continue on Budesonide Neb Twice daily   Prednisone taper over next week.  USe spiriva daily  Chest xray and labs today  Please contact office for sooner follow up if symptoms do not improve or worsen or seek emergency care  follow up Dr. Lake Bells in 6 weeks and .As needed

## 2016-01-02 NOTE — Progress Notes (Signed)
Reviewed, agree 

## 2016-01-03 NOTE — Progress Notes (Signed)
Called spoke with pt. Reviewed results and recs. Pt voiced understanding and had no further questions.

## 2016-01-05 ENCOUNTER — Telehealth: Payer: Self-pay

## 2016-01-05 MED ORDER — FLUTICASONE-SALMETEROL 230-21 MCG/ACT IN AERO: 2.0000 | INHALATION_SPRAY | Freq: Two times a day (BID) | RESPIRATORY_TRACT | 5 refills | Status: DC

## 2016-01-05 NOTE — Progress Notes (Signed)
Called spoke with pt. Reviewed results and recs. Pt voiced understanding and had no further questions.

## 2016-01-05 NOTE — Telephone Encounter (Signed)
Per verbal order from TP The two medications are the same  Would like for him to try Advair HFA 230 If Advair is too expensive we can try AirDuo   Called spoke with pt. Reviewed results and recs. Rx sent to [facility] in [place]. Pt voiced understanding and had no further questions. Nothing further needed.

## 2016-01-05 NOTE — Telephone Encounter (Signed)
Stop budesonide and brovana. -unable to tolerate, makes him too nervous and shakey.  Go back to Advair 2 puffs Twice daily  (HFA 230 ) -please fix MAR , please send to pharmacy  Cont on Spiriva daily .  May use Albuterol Neb Twice daily  .  Finish prednisone taper .  Please contact office for sooner follow up if symptoms do not improve or worsen or seek emergency care  Follow up with Dr. Lake Bells in 6 weeks and As needed   He denies chest pain, orthopnea, edema or fever.

## 2016-01-05 NOTE — Telephone Encounter (Signed)
Pt states that he has been taking a generic Advair 250 that he has been getting from [place] (Seroflo 250) and is wanting to know if this is what needs to be called in rather than Advair 230. Pt is concerned with dropping the dose down from 250 to 230 if there will much difference noticed. Please advise TP if there is much difference between the 230 and 250 dose -- between the Seroflo 250 and the Advair 230. Please advise Tammy Parrett, thanks.

## 2016-01-05 NOTE — Telephone Encounter (Signed)
Pt is calling about checking on the status of the message.

## 2016-01-05 NOTE — Telephone Encounter (Signed)
Last ov with TP on 01/02/16 Instructions      Return in about 6 weeks (around 02/13/2016) for Follow up Dr. Lake Bells.  Begin Brovana Neb Twice daily   Continue on Budesonide Neb Twice daily   Prednisone taper over next week.  USe spiriva daily  Chest xray and labs today  Please contact office for sooner follow up if symptoms do not improve or worsen or seek emergency care  follow up Dr. Lake Bells in 6 weeks and .As needed     Called to review cxr results and pt c/o SOB. He states that the brovana and pulmicort does not help. He states he took both medications separately on 01/04/16. He c/o SOB, shaking, and hot flashes. He states he used his albuterol inhaler and it seemed to help. He is requesting a message be sent to TP for further recs. I offered ov with TP today at 4pm. Pt refused stating it was to late in the day.  I explained to him that I would send the message and return his call with her recs. He voiced understanding and had no further questions.   TP please advise

## 2016-01-05 NOTE — Telephone Encounter (Signed)
Spoke with pt, advised that we were awaiting TP's recs and that we would be calling him once we head from her.    TP please advise on below med recs.  Thanks.

## 2016-01-06 DIAGNOSIS — J449 Chronic obstructive pulmonary disease, unspecified: Secondary | ICD-10-CM | POA: Diagnosis not present

## 2016-01-06 DIAGNOSIS — I10 Essential (primary) hypertension: Secondary | ICD-10-CM | POA: Diagnosis not present

## 2016-02-03 DIAGNOSIS — I10 Essential (primary) hypertension: Secondary | ICD-10-CM | POA: Diagnosis not present

## 2016-02-03 DIAGNOSIS — J449 Chronic obstructive pulmonary disease, unspecified: Secondary | ICD-10-CM | POA: Diagnosis not present

## 2016-02-14 ENCOUNTER — Encounter: Payer: Self-pay | Admitting: Pulmonary Disease

## 2016-02-14 ENCOUNTER — Ambulatory Visit (INDEPENDENT_AMBULATORY_CARE_PROVIDER_SITE_OTHER): Payer: Medicare Other | Admitting: Pulmonary Disease

## 2016-02-14 ENCOUNTER — Ambulatory Visit: Payer: Medicare Other

## 2016-02-14 VITALS — BP 138/76 | HR 83 | Ht 68.0 in | Wt 182.0 lb

## 2016-02-14 DIAGNOSIS — J441 Chronic obstructive pulmonary disease with (acute) exacerbation: Secondary | ICD-10-CM

## 2016-02-14 DIAGNOSIS — J432 Centrilobular emphysema: Secondary | ICD-10-CM

## 2016-02-14 DIAGNOSIS — F17200 Nicotine dependence, unspecified, uncomplicated: Secondary | ICD-10-CM | POA: Diagnosis not present

## 2016-02-14 NOTE — Patient Instructions (Signed)
We will refer you for pulmonary rehabilitation Use albuterol 4 times a day Keep taking Advair and Spiriva We will see you back in 4 weeks with a nurse practitioner to see if your shortness of breath is improving

## 2016-02-14 NOTE — Assessment & Plan Note (Addendum)
I don't think that Dominic Willis has come to grips with the fact that he has very severe COPD. He continues to struggle with severe dyspnea and this is due largely in part to the severity of his COPD but also his severe deconditioning.  He needs to participate in pulmonary rehabilitation.  I explained to him today at length that he is on maximal medical therapy for his COPD.  Plan: 6 minute walk test today Flu shot today Continue Advair Continue Spiriva Use albuterol scheduled throughout the course of the day (four times per day) Pulmonary rehabilitation referral  > 50% of today's 28 minute visit spent face to face

## 2016-02-14 NOTE — Assessment & Plan Note (Signed)
Congratulated on quitting smoking. He quit in September 2017 per

## 2016-02-14 NOTE — Progress Notes (Signed)
Subjective:    Patient ID: Dominic Willis, male    DOB: 1940/09/28, 75 y.o.   MRN: [id number]  Synopsis: Referred in 2017 for COPD. September 2017 simple spirometry clear airflow obstruction, FEV1 0.92 L (32% predicted) February 2017 nuclear stress test> "low risk study".  He quit smoking 12/2015.  He had smoked   HPI Chief Complaint  Patient presents with  . Follow-up    SOB with exertion is unchanged- also notes sinus congestion, PND, runny nose, midline chest pain with heavy exertion.     Mr. Ponzi still struggles with dyspnea.  Just walking from his house to his mailbox will make him quite short of breath.  He says that he is gasping for breath.  He feels like he is about to pass out when he is short of breath.  This problem has not improved since the last visit.    Brovana and pulmicort made him feel worse.  He felt very nervous and he felt more short of breath.  He is currently taking Advair and Spiriva.  He uses a rescue inhaler often which helps with his dyspnea.    He has been noting increasing sinus congestion and postnasal drip lately. He says this is significantly worse in the mornings despite taking Claritin-D.  Past Medical History:  Diagnosis Date  . Actinic keratosis   . Asthma   . COPD (chronic obstructive pulmonary disease) ([place])   . Hypertension   . Insomnia   . Seasonal allergies   . Tobacco abuse       Review of Systems  Constitutional: Positive for fatigue. Negative for chills and fever.  HENT: Positive for postnasal drip, rhinorrhea and sinus pressure.   Respiratory: Positive for cough, shortness of breath and wheezing.   Cardiovascular: Negative for chest pain, palpitations and leg swelling.       Objective:   Physical Exam Vitals:   02/14/16 1440  BP: 138/76  Pulse: 83  SpO2: 98%  Weight: 182 lb (82.6 kg)  Height: 5\' 8"  (1.727 m)   RA  Gen: well appearing HENT: OP clear, neck supple PULM: Poor air movement, normal percussion CV:  RRR, no mgr, trace edema GI: BS+, soft, nontender Derm: no cyanosis or rash Psyche: normal mood and affect        Assessment & Plan:  COPD exacerbation ([place]) I don't think that Mr. Tremel has come to grips with the fact that he has very severe COPD. He continues to struggle with severe dyspnea and this is due largely in part to the severity of his COPD but also his severe deconditioning.  He needs to participate in pulmonary rehabilitation.  I explained to him today at length that he is on maximal medical therapy for his COPD.  Plan: 6 minute walk test today Flu shot today Continue Advair Continue Spiriva Use albuterol scheduled throughout the course of the day (four times per day) Pulmonary rehabilitation referral  > 50% of today's 28 minute visit spent face to face  Tobacco use disorder Congratulated on quitting smoking. He quit in September 2017 per    Current Outpatient Prescriptions:  .  albuterol (PROVENTIL) (2.5 MG/3ML) 0.083% nebulizer solution, Take 2.5 mg by nebulization every 6 (six) hours as needed for wheezing or shortness of breath., Disp: , Rfl:  .  ALPRAZolam (XANAX) 1 MG tablet, Take 1 mg by mouth at bedtime., Disp: , Rfl:  .  aspirin EC 81 MG tablet, Take 81 mg by mouth daily., Disp: , Rfl:  .  fluticasone-salmeterol (ADVAIR HFA) 230-21 MCG/ACT inhaler, Inhale 2 puffs into the lungs 2 (two) times daily., Disp: 1 Inhaler, Rfl: 5 .  loratadine-pseudoephedrine (CLARITIN-D 12-HOUR) 5-120 MG tablet, Take 1 tablet by mouth 2 (two) times daily., Disp: , Rfl:  .  losartan (COZAAR) 50 MG tablet, Take 50 mg by mouth daily., Disp: , Rfl:  .  pantoprazole (PROTONIX) 40 MG tablet, Take 40 mg by mouth daily., Disp: , Rfl:  .  tiotropium (SPIRIVA) 18 MCG inhalation capsule, Place 18 mcg into inhaler and inhale daily., Disp: , Rfl:

## 2016-02-28 DIAGNOSIS — J449 Chronic obstructive pulmonary disease, unspecified: Secondary | ICD-10-CM | POA: Diagnosis not present

## 2016-02-28 DIAGNOSIS — I10 Essential (primary) hypertension: Secondary | ICD-10-CM | POA: Diagnosis not present

## 2016-03-01 ENCOUNTER — Encounter (HOSPITAL_COMMUNITY)
Admission: RE | Admit: 2016-03-01 | Discharge: 2016-03-01 | Disposition: A | Discharge status: Home / Self Care | Payer: Medicare Other | Source: Ambulatory Visit | Attending: Pulmonary Disease | Admitting: Pulmonary Disease

## 2016-03-01 VITALS — BP 170/80 | Ht 68.0 in | Wt 186.3 lb

## 2016-03-01 DIAGNOSIS — J432 Centrilobular emphysema: Secondary | ICD-10-CM | POA: Diagnosis not present

## 2016-03-01 NOTE — Progress Notes (Addendum)
Cardiac/Pulmonary Rehab Medication Review by a Pharmacist  Does the patient  feel that his/her medications are working for him/her?  Yes, but still gets out of breath.  Has the patient been experiencing any side effects to the medications prescribed?  no  Does the patient measure his/her own blood pressure or blood glucose at home?  no   Does the patient have any problems obtaining medications due to transportation or finances?   Yes.  Sometimes trouble getting refills called in.  Patient has started utilizing [place] to help decrease expenses.  Understanding of regimen: good Understanding of indications: good Potential of compliance: excellent.  Symptoms help remind him to take medications.  Pharmacist comments: Good understanding of medications.  Utilizing [employer] as described above. Goal is to improve lung function.  Pricilla Larsson 03/01/2016 2:30 PM

## 2016-03-01 NOTE — Progress Notes (Signed)
Pulmonary Individual Treatment Plan  Patient Details  Name: Dominic Willis MRN: [id number] Date of Birth: 1941/03/15 Referring Provider:    Initial Encounter Date:   Visit Diagnosis: Centrilobular emphysema ([place])  Patient's Home Medications on Admission:   Current Outpatient Prescriptions:  .  albuterol (PROVENTIL HFA;VENTOLIN HFA) 108 (90 Base) MCG/ACT inhaler, Inhale 2-4 puffs into the lungs every 6 (six) hours as needed for wheezing or shortness of breath., Disp: , Rfl:  .  albuterol (PROVENTIL) (2.5 MG/3ML) 0.083% nebulizer solution, Take 2.5 mg by nebulization every 6 (six) hours as needed for wheezing or shortness of breath., Disp: , Rfl:  .  ALPRAZolam (XANAX) 1 MG tablet, Take 1 mg by mouth at bedtime., Disp: , Rfl:  .  aspirin EC 81 MG tablet, Take 81 mg by mouth daily., Disp: , Rfl:  .  Cyanocobalamin (B-12 SL), Place 1 tablet under the tongue daily., Disp: , Rfl:  .  doxycycline (VIBRA-TABS) 100 MG tablet, Take 100 mg by mouth daily., Disp: , Rfl:  .  fluticasone-salmeterol (ADVAIR HFA) 230-21 MCG/ACT inhaler, Inhale 2 puffs into the lungs 2 (two) times daily., Disp: 1 Inhaler, Rfl: 5 .  loratadine-pseudoephedrine (CLARITIN-D 12-HOUR) 5-120 MG tablet, Take 1 tablet by mouth 2 (two) times daily., Disp: , Rfl:  .  losartan (COZAAR) 50 MG tablet, Take 50 mg by mouth daily., Disp: , Rfl:  .  Multiple Vitamins-Minerals (VISION VITAMINS PO), Take 1 tablet by mouth 2 (two) times daily., Disp: , Rfl:  .  Naproxen Sodium (ALEVE PO), Take 1 tablet by mouth 2 (two) times daily as needed (pain)., Disp: , Rfl:  .  pantoprazole (PROTONIX) 40 MG tablet, Take 40 mg by mouth daily., Disp: , Rfl:  .  tiotropium (SPIRIVA) 18 MCG inhalation capsule, Place 18 mcg into inhaler and inhale daily., Disp: , Rfl:  .  vitamin E 400 UNIT capsule, Take 400 Units by mouth daily., Disp: , Rfl:   Past Medical History: Past Medical History:  Diagnosis Date  . Actinic keratosis   . Asthma   . COPD  (chronic obstructive pulmonary disease) ([place])   . Hypertension   . Insomnia   . Seasonal allergies   . Tobacco abuse     Tobacco Use: History  Smoking Status  . Current Every Day Smoker  . Packs/day: 1.00  . Years: 57.00  . Types: Cigarettes  . Start date: 03/27/1958  Smokeless Tobacco  . Never Used    Comment: currently smoking 0.5ppd    Labs: Recent Review Flowsheet Data    There is no flowsheet data to display.      Capillary Blood Glucose: No results found for: GLUCAP   ADL UCSD:     Pulmonary Assessment Scores    Row Name 03/01/16 1601         ADL UCSD   ADL Phase Entry     SOB Score total 90     Rest 3     Walk 12     Stairs 4     Bath 4     Dress 4     Shop 4       CAT Score   CAT Score 31       mMRC Score   mMRC Score 4        Pulmonary Function Assessment:   Exercise Target Goals:    Exercise Program Goal: Individual exercise prescription set with THRR, safety & activity barriers. Participant demonstrates ability to understand and report RPE using  BORG scale, to self-measure pulse accurately, and to acknowledge the importance of the exercise prescription.  Exercise Prescription Goal: Starting with aerobic activity 30 plus minutes a day, 3 days per week for initial exercise prescription. Provide home exercise prescription and guidelines that participant acknowledges understanding prior to discharge.  Activity Barriers & Risk Stratification:     Activity Barriers & Cardiac Risk Stratification - 03/01/16 1559      Activity Barriers & Cardiac Risk Stratification   Activity Barriers Shortness of Breath   Cardiac Risk Stratification High      6 Minute Walk:     6 Minute Walk    Row Name 03/01/16 1635         6 Minute Walk   Phase Initial     Distance 1000 feet     Walk Time 6 minutes     # of Rest Breaks 0     MPH 1.89     METS 2.45     RPE 17     Perceived Dyspnea  16     VO2 Peak 7.43     Symptoms Yes (comment)      Comments Extreme SOB. Practiced pursed lip breathing until breathing was back to WNL.      Resting HR 81 bpm     Resting BP 170/80     Max Ex. HR 106 bpm     Max Ex. BP 188/84     2 Minute Post BP 140/82        Initial Exercise Prescription:   Perform Capillary Blood Glucose checks as needed.  Exercise Prescription Changes:   Exercise Comments:   Discharge Exercise Prescription (Final Exercise Prescription Changes):   Nutrition:  Target Goals: Understanding of nutrition guidelines, daily intake of sodium 1500mg , cholesterol 200mg , calories 30% from fat and 7% or less from saturated fats, daily to have 5 or more servings of fruits and vegetables.  Biometrics:     Pre Biometrics - 03/01/16 1640      Pre Biometrics   Height 5\' 8"  (1.727 m)   Weight 186 lb 4.8 oz (84.5 kg)   Waist Circumference 38.5 inches   Hip Circumference 40.5 inches   Waist to Hip Ratio 0.95 %   BMI (Calculated) 28.4   Triceps Skinfold 18 mm   % Body Fat 28 %   Grip Strength 66 kg   Flexibility 0 in   Single Leg Stand 2 seconds       Nutrition Therapy Plan and Nutrition Goals:   Nutrition Discharge: Rate Your Plate Scores:     Nutrition Assessments - 03/01/16 1603      Rate Your Plate Scores   Pre Score 47   Pre Score % 47 %      Psychosocial: Target Goals: Acknowledge presence or absence of depression, maximize coping skills, provide positive support system. Participant is able to verbalize types and ability to use techniques and skills needed for reducing stress and depression.  Initial Review & Psychosocial Screening:     Initial Psych Review & Screening - 03/01/16 [place]? Yes   Concerns Recent loss of child   Comments Patient says he is not depressed and does not need counseling. He does have down days and is still grieving the loss of his son 1 year ago.      Screening Interventions   Interventions Encouraged to exercise       Quality of Life Scores:  Quality of Life - 03/01/16 1641      Quality of Life Scores   Health/Function Pre 20.81 %   Socioeconomic Pre 20.83 %   Psych/Spiritual Pre 21 %   Family Pre 21 %   GLOBAL Pre 20.88 %      PHQ-9: Recent Review Flowsheet Data    Depression screen [facility] 2/9 03/01/2016   Decreased Interest 0   Down, Depressed, Hopeless 0   PHQ - 2 Score 0   Altered sleeping 1   Tired, decreased energy 3   Change in appetite 0   Feeling bad or failure about yourself  2   Trouble concentrating 1   Moving slowly or fidgety/restless 1   Suicidal thoughts 0   PHQ-9 Score 8   Difficult doing work/chores Somewhat difficult      Psychosocial Evaluation and Intervention:     Psychosocial Evaluation - 03/01/16 1609      Psychosocial Evaluation & Interventions   Interventions Encouraged to exercise with the program and follow exercise prescription   Comments Patient is grieving the loss of his son but says he is not depressed and does not need counseling.       Psychosocial Re-Evaluation:   Education: Education Goals: Education classes will be provided on a weekly basis, covering required topics. Participant will state understanding/return demonstration of topics presented.  Learning Barriers/Preferences:     Learning Barriers/Preferences - 03/01/16 1559      Learning Barriers/Preferences   Learning Barriers None   Learning Preferences Skilled Demonstration      Education Topics: How Lungs Work and Diseases: - Discuss the anatomy of the lungs and diseases that can affect the lungs, such as COPD.   Exercise: -Discuss the importance of exercise, FITT principles of exercise, normal and abnormal responses to exercise, and how to exercise safely.   Environmental Irritants: -Discuss types of environmental irritants and how to limit exposure to environmental irritants.   Meds/Inhalers and oxygen: - Discuss respiratory medications, definition of an  inhaler and oxygen, and the proper way to use an inhaler and oxygen.   Energy Saving Techniques: - Discuss methods to conserve energy and decrease shortness of breath when performing activities of daily living.    Bronchial Hygiene / Breathing Techniques: - Discuss breathing mechanics, pursed-lip breathing technique,  proper posture, effective ways to clear airways, and other functional breathing techniques   Cleaning Equipment: - Provides group verbal and written instruction about the health risks of elevated stress, cause of high stress, and healthy ways to reduce stress.   Nutrition I: Fats: - Discuss the types of cholesterol, what cholesterol does to the body, and how cholesterol levels can be controlled.   Nutrition II: Labels: -Discuss the different components of food labels and how to read food labels.   Respiratory Infections: - Discuss the signs and symptoms of respiratory infections, ways to prevent respiratory infections, and the importance of seeking medical treatment when having a respiratory infection.   Stress I: Signs and Symptoms: - Discuss the causes of stress, how stress may lead to anxiety and depression, and ways to limit stress.   Stress II: Relaxation: -Discuss relaxation techniques to limit stress.   Oxygen for Home/Travel: - Discuss how to prepare for travel when on oxygen and proper ways to transport and store oxygen to ensure safety.   Knowledge Questionnaire Score:     Knowledge Questionnaire Score - 03/01/16 1600      Knowledge Questionnaire Score   Pre Score 9/14  Core Components/Risk Factors/Patient Goals at Admission:     Personal Goals and Risk Factors at Admission - 03/01/16 1604      Core Components/Risk Factors/Patient Goals on Admission    Weight Management Obesity   Intervention Weight Management: Develop a combined nutrition and exercise program designed to reach desired caloric intake, while maintaining appropriate  intake of nutrient and fiber, sodium and fats, and appropriate energy expenditure required for the weight goal.   Sedentary Yes   Intervention Provide advice, education, support and counseling about physical activity/exercise needs.;Develop an individualized exercise prescription for aerobic and resistive training based on initial evaluation findings, risk stratification, comorbidities and participant's personal goals.   Expected Outcomes Achievement of increased cardiorespiratory fitness and enhanced flexibility, muscular endurance and strength shown through measurements of functional capacity and personal statement of participant.   Increase Strength and Stamina Yes   Intervention Provide advice, education, support and counseling about physical activity/exercise needs.;Develop an individualized exercise prescription for aerobic and resistive training based on initial evaluation findings, risk stratification, comorbidities and participant's personal goals.   Expected Outcomes Achievement of increased cardiorespiratory fitness and enhanced flexibility, muscular endurance and strength shown through measurements of functional capacity and personal statement of participant.   Improve shortness of breath with ADL's Yes   Intervention Provide education, individualized exercise plan and daily activity instruction to help decrease symptoms of SOB with activities of daily living.   Expected Outcomes Short Term: Achieves a reduction of symptoms when performing activities of daily living.   Develop more efficient breathing techniques such as purse lipped breathing and diaphragmatic breathing; and practicing self-pacing with activity Yes   Intervention Provide education, demonstration and support about specific breathing techniuqes utilized for more efficient breathing. Include techniques such as pursed lipped breathing, diaphragmatic breathing and self-pacing activity.   Expected Outcomes Short Term: Participant  will be able to demonstrate and use breathing techniques as needed throughout daily activities.   Stress Yes   Intervention Offer individual and/or small group education and counseling on adjustment to heart disease, stress management and health-related lifestyle change. Teach and support self-help strategies.   Expected Outcomes Long Term: Emotional wellbeing is indicated by absence of clinically significant psychosocial distress or social isolation.   Personal Goal Be able to do more ADL's without SOB, Breathe better overall. Be able to do the things he used to do-yard work; fishing; hunting.    Intervention Patient will attend pulmonary rehab 2 days/week and supplement with exercise at home 3 days/week.    Expected Outcomes Patient will meet his personal goals.       Core Components/Risk Factors/Patient Goals Review:      Goals and Risk Factor Review    Row Name 03/01/16 1607             Core Components/Risk Factors/Patient Goals Review   Personal Goals Review Weight Management/Obesity;Sedentary;Increase Strength and Stamina;Improve shortness of breath with ADL's;Develop more efficient breathing techniques such as purse lipped breathing and diaphragmatic breathing and practicing self-pacing with activity.;Stress          Core Components/Risk Factors/Patient Goals at Discharge (Final Review):      Goals and Risk Factor Review - 03/01/16 1607      Core Components/Risk Factors/Patient Goals Review   Personal Goals Review Weight Management/Obesity;Sedentary;Increase Strength and Stamina;Improve shortness of breath with ADL's;Develop more efficient breathing techniques such as purse lipped breathing and diaphragmatic breathing and practicing self-pacing with activity.;Stress      ITP Comments:   Comments: .Patient arrived  for 1st visit/orientation/education at 1230 . Patient was referred to PR by Dr. Lake Bells due to Centrilobular Emphysema (J43.2). During orientation advised  patient on arrival and appointment times what to wear, what to do before, during and after exercise. Reviewed attendance and class policy. Talked about inclement weather and class consultation policy. Pt is scheduled to return Pulmonary Rehab on 03/06/16 at 1530. Pt was advised to come to class 15 minutes before class starts. Patient was also given instructions on meeting with the dietician and attending the Family Structure classes. Pt is eager to get started. Patient participated in warm-up stretches including weights and resistance training.Patient was able to complete 6 minute walk test. He was very SOB at end of test. Instructed to do pursed lip breathing. He felt better, breathing was back WNL. No other s/s. Patient was measured for the equipment. Discussed equipment safety with patient. Took patient pre-anthropometric measurements. Patient finished visit at 1530.

## 2016-03-06 ENCOUNTER — Encounter (HOSPITAL_COMMUNITY)
Admission: RE | Admit: 2016-03-06 | Discharge: 2016-03-06 | Disposition: A | Discharge status: Home / Self Care | Payer: Medicare Other | Source: Ambulatory Visit | Attending: Pulmonary Disease | Admitting: Pulmonary Disease

## 2016-03-06 DIAGNOSIS — J432 Centrilobular emphysema: Secondary | ICD-10-CM

## 2016-03-06 NOTE — Progress Notes (Signed)
Daily Session Note  Patient Details  Name: Dominic Willis MRN: [id number] Date of Birth: 03-Nov-1940 Referring Provider:   Flowsheet Row PULMONARY REHAB OTHER RESPIRATORY from 03/06/2016 in [place]  Referring Provider  Dr. Lake Bells      Encounter Date: 03/06/2016  Check In:     Session Check In - 03/06/16 1330      Check-In   Location [facility]   Staff Present Suzanne Boron, BS, EP, Exercise Physiologist   Supervising physician immediately available to respond to emergencies See telemetry face sheet for immediately available MD   Medication changes reported     No   Fall or balance concerns reported    No   Warm-up and Cool-down Performed as group-led instruction   Resistance Training Performed Yes   VAD Patient? No     Pain Assessment   Currently in Pain? No/denies   Pain Score 0-No pain   Multiple Pain Sites No      Capillary Blood Glucose: No results found for this or any previous visit (from the past 24 hour(s)).   Goals Met:  Independence with exercise equipment Improved SOB with ADL's Using PLB without cueing & demonstrates good technique Exercise tolerated well No report of cardiac concerns or symptoms Strength training completed today  Goals Unmet:  Not Applicable  Comments: Check out 230   Dr. Sinda Du is Medical Director for [facility] Pulmonary Rehab.

## 2016-03-08 ENCOUNTER — Encounter (HOSPITAL_COMMUNITY): Payer: Medicare Other

## 2016-03-13 ENCOUNTER — Ambulatory Visit: Payer: Medicare Other | Admitting: Pulmonary Disease

## 2016-03-13 ENCOUNTER — Encounter (HOSPITAL_COMMUNITY)
Admission: RE | Admit: 2016-03-13 | Discharge: 2016-03-13 | Disposition: A | Discharge status: Home / Self Care | Payer: Medicare Other | Source: Ambulatory Visit | Attending: Pulmonary Disease | Admitting: Pulmonary Disease

## 2016-03-13 DIAGNOSIS — Z0000 Encounter for general adult medical examination without abnormal findings: Secondary | ICD-10-CM | POA: Diagnosis not present

## 2016-03-13 DIAGNOSIS — J432 Centrilobular emphysema: Secondary | ICD-10-CM | POA: Diagnosis not present

## 2016-03-13 DIAGNOSIS — R5383 Other fatigue: Secondary | ICD-10-CM | POA: Diagnosis not present

## 2016-03-13 DIAGNOSIS — Z299 Encounter for prophylactic measures, unspecified: Secondary | ICD-10-CM | POA: Diagnosis not present

## 2016-03-13 DIAGNOSIS — Z7189 Other specified counseling: Secondary | ICD-10-CM | POA: Diagnosis not present

## 2016-03-13 DIAGNOSIS — Z125 Encounter for screening for malignant neoplasm of prostate: Secondary | ICD-10-CM | POA: Diagnosis not present

## 2016-03-13 DIAGNOSIS — Z79899 Other long term (current) drug therapy: Secondary | ICD-10-CM | POA: Diagnosis not present

## 2016-03-13 DIAGNOSIS — Z1211 Encounter for screening for malignant neoplasm of colon: Secondary | ICD-10-CM | POA: Diagnosis not present

## 2016-03-13 DIAGNOSIS — Z1389 Encounter for screening for other disorder: Secondary | ICD-10-CM | POA: Diagnosis not present

## 2016-03-13 NOTE — Progress Notes (Signed)
Daily Session Note  Patient Details  Name: Dominic Willis MRN: [id number] Date of Birth: [birth year] Referring Provider:   Flowsheet Row PULMONARY REHAB OTHER RESPIRATORY from 03/06/2016 in [place]  Referring Provider  Dr. Lake Bells      Encounter Date: 03/13/2016  Check In:     Session Check In - 03/13/16 1330      Check-In   Location [facility]   Staff Present Suzanne Boron, BS, EP, Exercise Physiologist;Diane Coad, MS, EP, [facility], Exercise Physiologist   Supervising physician immediately available to respond to emergencies See telemetry face sheet for immediately available MD   Medication changes reported     No   Fall or balance concerns reported    No   Warm-up and Cool-down Performed as group-led instruction   Resistance Training Performed Yes   VAD Patient? No     Pain Assessment   Currently in Pain? No/denies   Pain Score 0-No pain   Multiple Pain Sites No      Capillary Blood Glucose: No results found for this or any previous visit (from the past 24 hour(s)).   Goals Met:  Independence with exercise equipment Improved SOB with ADL's Using PLB without cueing & demonstrates good technique Exercise tolerated well No report of cardiac concerns or symptoms Strength training completed today  Goals Unmet:  Not Applicable  Comments: Check out 230   Dr. Sinda Du is Medical Director for [facility] Pulmonary Rehab.

## 2016-03-14 ENCOUNTER — Ambulatory Visit (INDEPENDENT_AMBULATORY_CARE_PROVIDER_SITE_OTHER): Payer: Medicare Other | Admitting: Pulmonary Disease

## 2016-03-14 ENCOUNTER — Encounter: Payer: Self-pay | Admitting: Pulmonary Disease

## 2016-03-14 DIAGNOSIS — J432 Centrilobular emphysema: Secondary | ICD-10-CM | POA: Diagnosis not present

## 2016-03-14 NOTE — Assessment & Plan Note (Signed)
Dominic Willis continues to struggle with dyspnea. However, there is been very little water under the bridge since the last visit. He is only gone to pulmonary rehabilitation twice. At this point he's had an extensive cardiac evaluation which was normal, his recent hemoglobin value was normal, and he has no evidence of a neuromuscular disease. Saw believe that his dyspnea is due primarily to severe COPD as well as profound deconditioning. He is on maximal medical therapy. He needs to complete pulmonary rehabilitation to see if we taking get any improvement.  Plan: Trial of Trelegy combination inhaler If no improvement with that inhaler then resume Spiriva and Advair Complete pulmonary rehabilitation Follow-up 3 months  Greater than 50% of this 27 minute visit spent face-to-face

## 2016-03-14 NOTE — Patient Instructions (Signed)
Try taking Trelegy one puff daily instead of your Spiriva and Advair for two weeks, call us if this makes a big difference with your breathing.  If it does not, then go back to Spiriva and Advair. Complete pulmonary rehabilitation We will see you back in 3 months or sooner if needed

## 2016-03-14 NOTE — Progress Notes (Signed)
Subjective:    Patient ID: Dominic Willis, male    DOB: 03-29-1941, 75 y.o.   MRN: [id number]  Synopsis: Referred in 2017 for COPD. September 2017 simple spirometry clear airflow obstruction, FEV1 0.92 L (32% predicted) February 2017 nuclear stress test> "low risk study".  December 2016 echocardiogram LV EF 123456, grade 1 diastolic dysfunction He quit smoking 12/2015.   6 minute walk in October 2017 walk 288 m over the course of 4 minutes and 27 seconds. O2 saturation dropped no lower than 94%  HPI Chief Complaint  Patient presents with  . Follow-up    from previous visit. Pt states breathing is not better. SOB and chest pain.    Dominic Willis still struggles with dyspnea.  He says that the dyspnea has not any improvement.  He continues to cough regularly.  Some cough, but just a little mucus.  He has been doing pulmonary rehab, twice to [facility].  He says that he can't tell a different.   He is focused on the oxygen again today.  He says that he uses his nocturnal oxygen sometimes after he has been outside and use it for 5-10 minutes.   He continues to take Spiriva daily and Advair twice a day.  In general he has not seen much improvement in his symptoms. No bronchitis or pneumonia since the last visit.  Past Medical History:  Diagnosis Date  . Actinic keratosis   . Asthma   . COPD (chronic obstructive pulmonary disease) ([place])   . Hypertension   . Insomnia   . Seasonal allergies   . Tobacco abuse       Review of Systems  Constitutional: Positive for fatigue. Negative for chills and fever.  HENT: Negative for postnasal drip, rhinorrhea and sinus pressure.   Respiratory: Positive for cough, shortness of breath and wheezing.   Cardiovascular: Negative for chest pain, palpitations and leg swelling.       Objective:   Physical Exam Vitals:   03/14/16 1223 03/14/16 1224  BP:  128/80  Pulse:  78  SpO2:  97%  Weight: 186 lb 9.6 oz (84.6 kg)   Height: 5\' 8"  (1.727  m)    RA  Gen: well appearing HENT: OP clear, neck supple PULM: Poor air movement, slight wheeze L base, normal percussion CV: RRR, no mgr, trace edema GI: BS+, soft, nontender Derm: no cyanosis or rash Psyche: normal mood and affect        Assessment & Plan:  Centrilobular emphysema ([place]) Dominic Willis continues to struggle with dyspnea. However, there is been very little water under the bridge since the last visit. He is only gone to pulmonary rehabilitation twice. At this point he's had an extensive cardiac evaluation which was normal, his recent hemoglobin value was normal, and he has no evidence of a neuromuscular disease. Saw believe that his dyspnea is due primarily to severe COPD as well as profound deconditioning. He is on maximal medical therapy. He needs to complete pulmonary rehabilitation to see if we taking get any improvement.  Plan: Trial of Trelegy combination inhaler If no improvement with that inhaler then resume Spiriva and Advair Complete pulmonary rehabilitation Follow-up 3 months  Greater than 50% of this 27 minute visit spent face-to-face     Current Outpatient Prescriptions:  .  albuterol (PROVENTIL HFA;VENTOLIN HFA) 108 (90 Base) MCG/ACT inhaler, Inhale 2-4 puffs into the lungs every 6 (six) hours as needed for wheezing or shortness of breath., Disp: , Rfl:  .  albuterol (PROVENTIL) (2.5 MG/3ML) 0.083% nebulizer solution, Take 2.5 mg by nebulization every 6 (six) hours as needed for wheezing or shortness of breath., Disp: , Rfl:  .  ALPRAZolam (XANAX) 1 MG tablet, Take 1 mg by mouth at bedtime., Disp: , Rfl:  .  aspirin EC 81 MG tablet, Take 81 mg by mouth daily., Disp: , Rfl:  .  Cyanocobalamin (B-12 SL), Place 1 tablet under the tongue daily., Disp: , Rfl:  .  doxycycline (VIBRA-TABS) 100 MG tablet, Take 100 mg by mouth daily., Disp: , Rfl:  .  fluticasone-salmeterol (ADVAIR HFA) 230-21 MCG/ACT inhaler, Inhale 2 puffs into the lungs 2 (two) times daily.,  Disp: 1 Inhaler, Rfl: 5 .  loratadine-pseudoephedrine (CLARITIN-D 12-HOUR) 5-120 MG tablet, Take 1 tablet by mouth 2 (two) times daily., Disp: , Rfl:  .  losartan (COZAAR) 50 MG tablet, Take 50 mg by mouth daily., Disp: , Rfl:  .  Multiple Vitamins-Minerals (VISION VITAMINS PO), Take 1 tablet by mouth 2 (two) times daily., Disp: , Rfl:  .  Naproxen Sodium (ALEVE PO), Take 1 tablet by mouth 2 (two) times daily as needed (pain)., Disp: , Rfl:  .  pantoprazole (PROTONIX) 40 MG tablet, Take 40 mg by mouth daily., Disp: , Rfl:  .  tiotropium (SPIRIVA) 18 MCG inhalation capsule, Place 18 mcg into inhaler and inhale daily., Disp: , Rfl:  .  vitamin E 400 UNIT capsule, Take 400 Units by mouth daily., Disp: , Rfl:

## 2016-03-15 ENCOUNTER — Ambulatory Visit: Payer: Medicare Other | Admitting: Pulmonary Disease

## 2016-03-15 ENCOUNTER — Encounter (HOSPITAL_COMMUNITY)
Admission: RE | Admit: 2016-03-15 | Discharge: 2016-03-15 | Disposition: A | Discharge status: Home / Self Care | Payer: Medicare Other | Source: Ambulatory Visit | Attending: Pulmonary Disease | Admitting: Pulmonary Disease

## 2016-03-15 DIAGNOSIS — J432 Centrilobular emphysema: Secondary | ICD-10-CM | POA: Diagnosis not present

## 2016-03-15 NOTE — Progress Notes (Signed)
Daily Session Note  Patient Details  Name: Dominic Willis MRN: [id number] Date of Birth: May 08, 1940 Referring Provider:   Flowsheet Row PULMONARY REHAB OTHER RESPIRATORY from 03/06/2016 in [place]  Referring Provider  Dr. Lake Bells      Encounter Date: 03/15/2016  Check In:     Session Check In - 03/15/16 1330      Check-In   Location [facility]   Staff Present Aundra Dubin, RN, BSN;Jaqueline Uber Luther Parody, BS, EP, Exercise Physiologist   Supervising physician immediately available to respond to emergencies See telemetry face sheet for immediately available MD   Medication changes reported     No   Fall or balance concerns reported    No   Warm-up and Cool-down Performed as group-led instruction   Resistance Training Performed Yes   VAD Patient? No     Pain Assessment   Currently in Pain? No/denies   Pain Score 0-No pain   Multiple Pain Sites No      Capillary Blood Glucose: No results found for this or any previous visit (from the past 24 hour(s)).   Goals Met:  Independence with exercise equipment Improved SOB with ADL's Using PLB without cueing & demonstrates good technique Exercise tolerated well No report of cardiac concerns or symptoms Strength training completed today  Goals Unmet:  Not Applicable  Comments: Check out 230   Dr. Sinda Du is Medical Director for [facility] Pulmonary Rehab.

## 2016-03-20 ENCOUNTER — Encounter (HOSPITAL_COMMUNITY)
Admission: RE | Admit: 2016-03-20 | Discharge: 2016-03-20 | Disposition: A | Discharge status: Home / Self Care | Payer: Medicare Other | Source: Ambulatory Visit | Attending: Pulmonary Disease | Admitting: Pulmonary Disease

## 2016-03-20 DIAGNOSIS — J432 Centrilobular emphysema: Secondary | ICD-10-CM | POA: Insufficient documentation

## 2016-03-20 NOTE — Progress Notes (Signed)
Daily Session Note  Patient Details  Name: Dominic Willis MRN: [id number] Date of Birth: Sep 23, 1940 Referring Provider:   Flowsheet Row PULMONARY REHAB OTHER RESPIRATORY from 03/06/2016 in [place]  Referring Provider  Dr. Lake Bells      Encounter Date: 03/20/2016  Check In:     Session Check In - 03/20/16 1330      Check-In   Location [facility]   Staff Present Suzanne Boron, BS, EP, Exercise Physiologist;Diane Coad, MS, EP, [facility], Exercise Physiologist   Supervising physician immediately available to respond to emergencies See telemetry face sheet for immediately available MD   Medication changes reported     No   Fall or balance concerns reported    No   Warm-up and Cool-down Performed as group-led instruction   Resistance Training Performed Yes   VAD Patient? No     Pain Assessment   Currently in Pain? No/denies   Pain Score 0-No pain   Multiple Pain Sites No      Capillary Blood Glucose: No results found for this or any previous visit (from the past 24 hour(s)).   Goals Met:  Independence with exercise equipment Improved SOB with ADL's Using PLB without cueing & demonstrates good technique Exercise tolerated well No report of cardiac concerns or symptoms Strength training completed today  Goals Unmet:  Not Applicable  Comments: Check out 230   Dr. Sinda Du is Medical Director for [facility].

## 2016-03-22 ENCOUNTER — Encounter (HOSPITAL_COMMUNITY)
Admission: RE | Admit: 2016-03-22 | Discharge: 2016-03-22 | Disposition: A | Discharge status: Home / Self Care | Payer: Medicare Other | Source: Ambulatory Visit | Attending: Pulmonary Disease | Admitting: Pulmonary Disease

## 2016-03-22 DIAGNOSIS — J432 Centrilobular emphysema: Secondary | ICD-10-CM | POA: Diagnosis not present

## 2016-03-22 NOTE — Progress Notes (Signed)
Daily Session Note  Patient Details  Name: Dominic Willis MRN: [id number] Date of Birth: 09-Apr-1941 Referring Provider:   Flowsheet Row PULMONARY REHAB OTHER RESPIRATORY from 03/06/2016 in [place]  Referring Provider  Dr. Lake Bells      Encounter Date: 03/22/2016  Check In:     Session Check In - 03/22/16 1045      Check-In   Location [facility]   Staff Present Aundra Dubin, RN, BSN;Dillon [place] Luther Parody, BS, EP, Exercise Physiologist   Supervising physician immediately available to respond to emergencies See telemetry face sheet for immediately available MD   Medication changes reported     No   Fall or balance concerns reported    No   Warm-up and Cool-down Performed as group-led instruction   Resistance Training Performed Yes   VAD Patient? No     Pain Assessment   Currently in Pain? No/denies   Pain Score 0-No pain   Multiple Pain Sites No      Capillary Blood Glucose: No results found for this or any previous visit (from the past 24 hour(s)).   Goals Met:  Independence with exercise equipment Improved SOB with ADL's Using PLB without cueing & demonstrates good technique Exercise tolerated well No report of cardiac concerns or symptoms Strength training completed today  Goals Unmet:  Not Applicable  Comments: Check out 1145   Dr. Sinda Du is Medical Director for [facility] Pulmonary Rehab.

## 2016-03-27 ENCOUNTER — Encounter (HOSPITAL_COMMUNITY): Payer: Medicare Other

## 2016-03-28 DIAGNOSIS — I10 Essential (primary) hypertension: Secondary | ICD-10-CM | POA: Diagnosis not present

## 2016-03-28 DIAGNOSIS — J449 Chronic obstructive pulmonary disease, unspecified: Secondary | ICD-10-CM | POA: Diagnosis not present

## 2016-03-29 ENCOUNTER — Encounter (HOSPITAL_COMMUNITY)
Admission: RE | Admit: 2016-03-29 | Discharge: 2016-03-29 | Disposition: A | Discharge status: Home / Self Care | Payer: Medicare Other | Source: Ambulatory Visit | Attending: Pulmonary Disease | Admitting: Pulmonary Disease

## 2016-03-29 DIAGNOSIS — J432 Centrilobular emphysema: Secondary | ICD-10-CM

## 2016-03-29 NOTE — Progress Notes (Signed)
Pulmonary Individual Treatment Plan  Patient Details  Name: Dominic Willis MRN: [id number] Date of Birth: 20-May-1940 Referring Provider:   Flowsheet Row PULMONARY REHAB OTHER RESPIRATORY from 03/06/2016 in [place]  Referring Provider  Dr. Lake Bells      Initial Encounter Date:  Flowsheet Row PULMONARY REHAB OTHER RESPIRATORY from 03/06/2016 in [place]  Date  03/01/16  Referring Provider  Dr. Lake Bells      Visit Diagnosis: Centrilobular emphysema ([place])  Patient's Home Medications on Admission:   Current Outpatient Prescriptions:  .  albuterol (PROVENTIL HFA;VENTOLIN HFA) 108 (90 Base) MCG/ACT inhaler, Inhale 2-4 puffs into the lungs every 6 (six) hours as needed for wheezing or shortness of breath., Disp: , Rfl:  .  albuterol (PROVENTIL) (2.5 MG/3ML) 0.083% nebulizer solution, Take 2.5 mg by nebulization every 6 (six) hours as needed for wheezing or shortness of breath., Disp: , Rfl:  .  ALPRAZolam (XANAX) 1 MG tablet, Take 1 mg by mouth at bedtime., Disp: , Rfl:  .  aspirin EC 81 MG tablet, Take 81 mg by mouth daily., Disp: , Rfl:  .  Cyanocobalamin (B-12 SL), Place 1 tablet under the tongue daily., Disp: , Rfl:  .  doxycycline (VIBRA-TABS) 100 MG tablet, Take 100 mg by mouth daily., Disp: , Rfl:  .  fluticasone-salmeterol (ADVAIR HFA) 230-21 MCG/ACT inhaler, Inhale 2 puffs into the lungs 2 (two) times daily., Disp: 1 Inhaler, Rfl: 5 .  loratadine-pseudoephedrine (CLARITIN-D 12-HOUR) 5-120 MG tablet, Take 1 tablet by mouth 2 (two) times daily., Disp: , Rfl:  .  losartan (COZAAR) 50 MG tablet, Take 50 mg by mouth daily., Disp: , Rfl:  .  Multiple Vitamins-Minerals (VISION VITAMINS PO), Take 1 tablet by mouth 2 (two) times daily., Disp: , Rfl:  .  Naproxen Sodium (ALEVE PO), Take 1 tablet by mouth 2 (two) times daily as needed (pain)., Disp: , Rfl:  .  pantoprazole (PROTONIX) 40 MG tablet, Take 40 mg by mouth daily., Disp: , Rfl:  .   tiotropium (SPIRIVA) 18 MCG inhalation capsule, Place 18 mcg into inhaler and inhale daily., Disp: , Rfl:  .  vitamin E 400 UNIT capsule, Take 400 Units by mouth daily., Disp: , Rfl:   Past Medical History: Past Medical History:  Diagnosis Date  . Actinic keratosis   . Asthma   . COPD (chronic obstructive pulmonary disease) ([place])   . Hypertension   . Insomnia   . Seasonal allergies   . Tobacco abuse     Tobacco Use: History  Smoking Status  . Former Smoker  . Packs/day: 1.00  . Years: 57.00  . Types: Cigarettes  . Start date: 03/27/1958  . Quit date: 12/16/2015  Smokeless Tobacco  . Never Used    Comment: currently smoking 0.5ppd    Labs: Recent Review Flowsheet Data    There is no flowsheet data to display.      Capillary Blood Glucose: No results found for: GLUCAP   ADL UCSD:     Pulmonary Assessment Scores    Row Name 03/01/16 1601         ADL UCSD   ADL Phase Entry     SOB Score total 90     Rest 3     Walk 12     Stairs 4     Bath 4     Dress 4     Shop 4       CAT Score   CAT Score 31  mMRC Score   mMRC Score 4        Pulmonary Function Assessment:   Exercise Target Goals:    Exercise Program Goal: Individual exercise prescription set with THRR, safety & activity barriers. Participant demonstrates ability to understand and report RPE using BORG scale, to self-measure pulse accurately, and to acknowledge the importance of the exercise prescription.  Exercise Prescription Goal: Starting with aerobic activity 30 plus minutes a day, 3 days per week for initial exercise prescription. Provide home exercise prescription and guidelines that participant acknowledges understanding prior to discharge.  Activity Barriers & Risk Stratification:     Activity Barriers & Cardiac Risk Stratification - 03/01/16 1559      Activity Barriers & Cardiac Risk Stratification   Activity Barriers Shortness of Breath   Cardiac Risk Stratification High       6 Minute Walk:     6 Minute Walk    Row Name 03/01/16 1635         6 Minute Walk   Phase Initial     Distance 1000 feet     Walk Time 6 minutes     # of Rest Breaks 0     MPH 1.89     METS 2.45     RPE 17     Perceived Dyspnea  16     VO2 Peak 7.43     Symptoms Yes (comment)     Comments Extreme SOB. Practiced pursed lip breathing until breathing was back to WNL.      Resting HR 81 bpm     Resting BP 170/80     Max Ex. HR 106 bpm     Max Ex. BP 188/84     2 Minute Post BP 140/82        Initial Exercise Prescription:     Initial Exercise Prescription - 03/06/16 1100      Date of Initial Exercise RX and Referring Provider   Date 03/01/16   Referring Provider Dr. Lake Bells     Treadmill   MPH 1   Grade 0   Minutes 15   METs 1.7     NuStep   Level 2   Watts 15   Minutes 20   METs 1.9     Prescription Details   Frequency (times per week) 2   Duration Progress to 30 minutes of continuous aerobic without signs/symptoms of physical distress     Intensity   THRR REST +  20   THRR 40-80% of Max Heartrate 107-120-132   Ratings of Perceived Exertion 11-13   Perceived Dyspnea 0-4     Progression   Progression Continue progressive overload as per policy without signs/symptoms or physical distress.     Resistance Training   Training Prescription Yes   Weight 1   Reps 10-12      Perform Capillary Blood Glucose checks as needed.  Exercise Prescription Changes:      Exercise Prescription Changes    Row Name 03/22/16 1500             Exercise Review   Progression Yes         Response to Exercise   Blood Pressure (Admit) 160/80       Blood Pressure (Exercise) 180/84       Blood Pressure (Exit) 156/74       Heart Rate (Admit) 91 bpm       Heart Rate (Exercise) 91 bpm       Heart Rate (Exit)  95 bpm       Oxygen Saturation (Admit) 94 %       Oxygen Saturation (Exercise) 97 %       Oxygen Saturation (Exit) 95 %       Rating of Perceived  Exertion (Exercise) 10       Perceived Dyspnea (Exercise) 13       Duration Progress to 30 minutes of continuous aerobic without signs/symptoms of physical distress       Intensity Rest + 20         Progression   Progression Continue progressive overload as per policy without signs/symptoms or physical distress.         Resistance Training   Training Prescription Yes       Weight 2       Reps 10-12         Treadmill   MPH 1.3       Grade 0       Minutes 20       METs 1.9         NuStep   Level 2       Watts 33       Minutes 20       METs 3.57         Home Exercise Plan   Plans to continue exercise at Home       Frequency Add 2 additional days to program exercise sessions.          Exercise Comments:      Exercise Comments    Row Name 03/22/16 1539           Exercise Comments Patient is progressing appropriately           Discharge Exercise Prescription (Final Exercise Prescription Changes):     Exercise Prescription Changes - 03/22/16 1500      Exercise Review   Progression Yes     Response to Exercise   Blood Pressure (Admit) 160/80   Blood Pressure (Exercise) 180/84   Blood Pressure (Exit) 156/74   Heart Rate (Admit) 91 bpm   Heart Rate (Exercise) 91 bpm   Heart Rate (Exit) 95 bpm   Oxygen Saturation (Admit) 94 %   Oxygen Saturation (Exercise) 97 %   Oxygen Saturation (Exit) 95 %   Rating of Perceived Exertion (Exercise) 10   Perceived Dyspnea (Exercise) 13   Duration Progress to 30 minutes of continuous aerobic without signs/symptoms of physical distress   Intensity Rest + 20     Progression   Progression Continue progressive overload as per policy without signs/symptoms or physical distress.     Resistance Training   Training Prescription Yes   Weight 2   Reps 10-12     Treadmill   MPH 1.3   Grade 0   Minutes 20   METs 1.9     NuStep   Level 2   Watts 33   Minutes 20   METs 3.57     Home Exercise Plan   Plans to continue  exercise at Home   Frequency Add 2 additional days to program exercise sessions.      Nutrition:  Target Goals: Understanding of nutrition guidelines, daily intake of sodium <1566m, cholesterol <2024m calories 30% from fat and 7% or less from saturated fats, daily to have 5 or more servings of fruits and vegetables.  Biometrics:     Pre Biometrics - 03/01/16 1640      Pre Biometrics   Height  _0  (1.727 m)   Weight 186 lb 4.8 oz (84.5 kg)   Waist Circumference 38.5 inches   Hip Circumference 40.5 inches   Waist to Hip Ratio 0.95 %   BMI (Calculated) 28.4   Triceps Skinfold 18 mm   % Body Fat 28 %   Grip Strength 66 kg   Flexibility 0 in   Single Leg Stand 2 seconds       Nutrition Therapy Plan and Nutrition Goals:   Nutrition Discharge: Rate Your Plate Scores:     Nutrition Assessments - 03/01/16 1603      Rate Your Plate Scores   Pre Score 47   Pre Score % 47 %      Psychosocial: Target Goals: Acknowledge presence or absence of depression, maximize coping skills, provide positive support system. Participant is able to verbalize types and ability to use techniques and skills needed for reducing stress and depression.  Initial Review & Psychosocial Screening:     Initial Psych Review & Screening - 03/01/16 Advance? Yes   Concerns Recent loss of child   Comments Patient says he is not depressed and does not need counseling. He does have down days and is still grieving the loss of his son 1 year ago.      Screening Interventions   Interventions Encouraged to exercise      Quality of Life Scores:     Quality of Life - 03/01/16 1641      Quality of Life Scores   Health/Function Pre 20.81 %   Socioeconomic Pre 20.83 %   Psych/Spiritual Pre 21 %   Family Pre 21 %   GLOBAL Pre 20.88 %      PHQ-9: Recent Review Flowsheet Data    Depression screen [facility] 2/9 03/01/2016   Decreased Interest 0   Down,  Depressed, Hopeless 0   PHQ - 2 Score 0   Altered sleeping 1   Tired, decreased energy 3   Change in appetite 0   Feeling bad or failure about yourself  2   Trouble concentrating 1   Moving slowly or fidgety/restless 1   Suicidal thoughts 0   PHQ-9 Score 8   Difficult doing work/chores Somewhat difficult      Psychosocial Evaluation and Intervention:     Psychosocial Evaluation - 03/01/16 1609      Psychosocial Evaluation & Interventions   Interventions Encouraged to exercise with the program and follow exercise prescription   Comments Patient is grieving the loss of his son but says he is not depressed and does not need counseling.       Psychosocial Re-Evaluation:     Psychosocial Re-Evaluation    [place] Name 03/29/16 1531             Psychosocial Re-Evaluation   Interventions Encouraged to attend Pulmonary Rehabilitation for the exercise       Comments Patient's QOL score was 20.88 and his PHQ-9 score was 8 indicating some depression. He says he has some days he feels depression but feels he does not need counseling. Continues to grieve the loss of a child.       Continued Psychosocial Services Needed No          Education: Education Goals: Education classes will be provided on a weekly basis, covering required topics. Participant will state understanding/return demonstration of topics presented.  Learning Barriers/Preferences:     Learning Barriers/Preferences - 03/01/16 1559  Learning Barriers/Preferences   Learning Barriers None   Learning Preferences Skilled Demonstration      Education Topics: How Lungs Work and Diseases: - Discuss the anatomy of the lungs and diseases that can affect the lungs, such as COPD.   Exercise: -Discuss the importance of exercise, FITT principles of exercise, normal and abnormal responses to exercise, and how to exercise safely.   Environmental Irritants: -Discuss types of environmental irritants and how to limit  exposure to environmental irritants.   Meds/Inhalers and oxygen: - Discuss respiratory medications, definition of an inhaler and oxygen, and the proper way to use an inhaler and oxygen.   Energy Saving Techniques: - Discuss methods to conserve energy and decrease shortness of breath when performing activities of daily living.  Flowsheet Row PULMONARY REHAB OTHER RESPIRATORY from 03/22/2016 in [place]  Date  03/15/16  Educator  Nils Flack  Instruction Review Code  2- meets goals/outcomes      Bronchial Hygiene / Breathing Techniques: - Discuss breathing mechanics, pursed-lip breathing technique,  proper posture, effective ways to clear airways, and other functional breathing techniques Flowsheet Row PULMONARY REHAB OTHER RESPIRATORY from 03/22/2016 in [place]  Date  03/22/16  Educator  Nils Flack  Instruction Review Code  2- meets goals/outcomes      Cleaning Equipment: - Provides group verbal and written instruction about the health risks of elevated stress, cause of high stress, and healthy ways to reduce stress.   Nutrition I: Fats: - Discuss the types of cholesterol, what cholesterol does to the body, and how cholesterol levels can be controlled.   Nutrition II: Labels: -Discuss the different components of food labels and how to read food labels.   Respiratory Infections: - Discuss the signs and symptoms of respiratory infections, ways to prevent respiratory infections, and the importance of seeking medical treatment when having a respiratory infection.   Stress I: Signs and Symptoms: - Discuss the causes of stress, how stress may lead to anxiety and depression, and ways to limit stress.   Stress II: Relaxation: -Discuss relaxation techniques to limit stress.   Oxygen for Home/Travel: - Discuss how to prepare for travel when on oxygen and proper ways to transport and store oxygen to ensure safety.   Knowledge  Questionnaire Score:     Knowledge Questionnaire Score - 03/01/16 1600      Knowledge Questionnaire Score   Pre Score 9/14      Core Components/Risk Factors/Patient Goals at Admission:     Personal Goals and Risk Factors at Admission - 03/01/16 1604      Core Components/Risk Factors/Patient Goals on Admission    Weight Management Obesity   Intervention Weight Management: Develop a combined nutrition and exercise program designed to reach desired caloric intake, while maintaining appropriate intake of nutrient and fiber, sodium and fats, and appropriate energy expenditure required for the weight goal.   Sedentary Yes   Intervention Provide advice, education, support and counseling about physical activity/exercise needs.;Develop an individualized exercise prescription for aerobic and resistive training based on initial evaluation findings, risk stratification, comorbidities and participant's personal goals.   Expected Outcomes Achievement of increased cardiorespiratory fitness and enhanced flexibility, muscular endurance and strength shown through measurements of functional capacity and personal statement of participant.   Increase Strength and Stamina Yes   Intervention Provide advice, education, support and counseling about physical activity/exercise needs.;Develop an individualized exercise prescription for aerobic and resistive training based on initial evaluation findings, risk stratification, comorbidities and participant's  personal goals.   Expected Outcomes Achievement of increased cardiorespiratory fitness and enhanced flexibility, muscular endurance and strength shown through measurements of functional capacity and personal statement of participant.   Improve shortness of breath with ADL's Yes   Intervention Provide education, individualized exercise plan and daily activity instruction to help decrease symptoms of SOB with activities of daily living.   Expected Outcomes Short Term:  Achieves a reduction of symptoms when performing activities of daily living.   Develop more efficient breathing techniques such as purse lipped breathing and diaphragmatic breathing; and practicing self-pacing with activity Yes   Intervention Provide education, demonstration and support about specific breathing techniuqes utilized for more efficient breathing. Include techniques such as pursed lipped breathing, diaphragmatic breathing and self-pacing activity.   Expected Outcomes Short Term: Participant will be able to demonstrate and use breathing techniques as needed throughout daily activities.   Stress Yes   Intervention Offer individual and/or small group education and counseling on adjustment to heart disease, stress management and health-related lifestyle change. Teach and support self-help strategies.   Expected Outcomes Long Term: Emotional wellbeing is indicated by absence of clinically significant psychosocial distress or social isolation.   Personal Goal Be able to do more ADL's without SOB, Breathe better overall. Be able to do the things he used to do-yard work; fishing; hunting.    Intervention Patient will attend pulmonary rehab 2 days/week and supplement with exercise at home 3 days/week.    Expected Outcomes Patient will meet his personal goals.       Core Components/Risk Factors/Patient Goals Review:      Goals and Risk Factor Review    Row Name 03/01/16 1607 03/29/16 1529           Core Components/Risk Factors/Patient Goals Review   Personal Goals Review Weight Management/Obesity;Sedentary;Increase Strength and Stamina;Improve shortness of breath with ADL's;Develop more efficient breathing techniques such as purse lipped breathing and diaphragmatic breathing and practicing self-pacing with activity.;Stress Weight Management/Obesity;Increase Strength and Stamina;Develop more efficient breathing techniques such as purse lipped breathing and diaphragmatic breathing and  practicing self-pacing with activity.;Improve shortness of breath with ADL's;Increase knowledge of respiratory medications and ability to use respiratory devices properly.;Stress  Be able to do more ADL's without SOB, Breathe better overall. Be able to do the things he used to do-yard work; fishing; hunting.       Review  - Patient has attended 7 sessions. He is progressing with some increase in his strength and stamina. He has maintained his weight. Will continue to monitor for progress.      Expected Outcomes  - Patient will continue to attend sessions meeting her personal goals.          Core Components/Risk Factors/Patient Goals at Discharge (Final Review):      Goals and Risk Factor Review - 03/29/16 1529      Core Components/Risk Factors/Patient Goals Review   Personal Goals Review Weight Management/Obesity;Increase Strength and Stamina;Develop more efficient breathing techniques such as purse lipped breathing and diaphragmatic breathing and practicing self-pacing with activity.;Improve shortness of breath with ADL's;Increase knowledge of respiratory medications and ability to use respiratory devices properly.;Stress  Be able to do more ADL's without SOB, Breathe better overall. Be able to do the things he used to do-yard work; fishing; hunting.    Review Patient has attended 7 sessions. He is progressing with some increase in his strength and stamina. He has maintained his weight. Will continue to monitor for progress.   Expected Outcomes Patient will  continue to attend sessions meeting her personal goals.       ITP Comments:     ITP Comments    Row Name 03/22/16 1254           ITP Comments Patient met with Registered Dietitian to discuss nutrition topics including: Heart healthty eating, heart health cooking and make smart choices when shopping; Portion control; weight management; and hydration. Patient attended a group session with the hospital chaplian called [facility] to  discuss and share how his recent pulmomary diagnosis has effected his life.          Comments: ITP 30 Day REVIEW Pt is making expected progress toward pulmonary rehab goals after completing 7 sessions. Recommend continued exercise, life style modification, education, and utilization of breathing techniques to increase stamina and strength and decrease shortness of breath with exertion.

## 2016-03-29 NOTE — Progress Notes (Signed)
Daily Session Note  Patient Details  Name: Dominic Willis MRN: [id number] Date of Birth: [birth year] Referring Provider:   Flowsheet Row PULMONARY REHAB OTHER RESPIRATORY from 03/06/2016 in [place]  Referring Provider  Dr. Lake Bells      Encounter Date: 03/29/2016  Check In:     Session Check In - 03/29/16 1330      Check-In   Location [facility]   Staff Present Aundra Dubin, RN, BSN;Gurjot Brisco Luther Parody, BS, EP, Exercise Physiologist   Supervising physician immediately available to respond to emergencies See telemetry face sheet for immediately available MD   Medication changes reported     No   Fall or balance concerns reported    No   Warm-up and Cool-down Performed as group-led instruction   Resistance Training Performed Yes   VAD Patient? No     Pain Assessment   Currently in Pain? No/denies   Pain Score 0-No pain   Multiple Pain Sites No      Capillary Blood Glucose: No results found for this or any previous visit (from the past 24 hour(s)).   Goals Met:  Independence with exercise equipment Improved SOB with ADL's Using PLB without cueing & demonstrates good technique Exercise tolerated well No report of cardiac concerns or symptoms Strength training completed today  Goals Unmet:  Not Applicable  Comments: Check out 230   Dr. Sinda Du is Medical Director for [facility] Pulmonary Rehab.

## 2016-04-02 DIAGNOSIS — L710 Perioral dermatitis: Secondary | ICD-10-CM | POA: Diagnosis not present

## 2016-04-02 DIAGNOSIS — L570 Actinic keratosis: Secondary | ICD-10-CM | POA: Diagnosis not present

## 2016-04-03 ENCOUNTER — Encounter (HOSPITAL_COMMUNITY)
Admission: RE | Admit: 2016-04-03 | Discharge: 2016-04-03 | Disposition: A | Discharge status: Home / Self Care | Payer: Medicare Other | Source: Ambulatory Visit | Attending: Pulmonary Disease | Admitting: Pulmonary Disease

## 2016-04-03 DIAGNOSIS — J432 Centrilobular emphysema: Secondary | ICD-10-CM | POA: Diagnosis not present

## 2016-04-03 NOTE — Progress Notes (Signed)
Daily Session Note  Patient Details  Name: ISIAH SCHEEL MRN: [id number] Date of Birth: 1940-05-23 Referring Provider:   Flowsheet Row PULMONARY REHAB OTHER RESPIRATORY from 03/06/2016 in [place]  Referring Provider  Dr. Lake Bells      Encounter Date: 04/03/2016  Check In:     Session Check In - 04/03/16 1330      Check-In   Location [facility]   Staff Present Suzanne Boron, BS, EP, Exercise Physiologist   Supervising physician immediately available to respond to emergencies See telemetry face sheet for immediately available MD   Medication changes reported     No   Fall or balance concerns reported    No   Warm-up and Cool-down Performed as group-led instruction   Resistance Training Performed Yes   VAD Patient? No     Pain Assessment   Currently in Pain? No/denies   Pain Score 0-No pain   Multiple Pain Sites No      Capillary Blood Glucose: No results found for this or any previous visit (from the past 24 hour(s)).   Goals Met:  Independence with exercise equipment Improved SOB with ADL's Using PLB without cueing & demonstrates good technique Exercise tolerated well No report of cardiac concerns or symptoms Strength training completed today  Goals Unmet:  Not Applicable  Comments: Check out 230   Dr. Sinda Du is Medical Director for [facility] Pulmonary Rehab.

## 2016-04-05 ENCOUNTER — Encounter (HOSPITAL_COMMUNITY)
Admission: RE | Admit: 2016-04-05 | Discharge: 2016-04-05 | Disposition: A | Discharge status: Home / Self Care | Payer: Medicare Other | Source: Ambulatory Visit | Attending: Pulmonary Disease | Admitting: Pulmonary Disease

## 2016-04-05 DIAGNOSIS — J432 Centrilobular emphysema: Secondary | ICD-10-CM

## 2016-04-05 NOTE — Progress Notes (Signed)
Daily Session Note  Patient Details  Name: Dominic Willis MRN: [id number] Date of Birth: 12-27-1940 Referring Provider:   Flowsheet Row PULMONARY REHAB OTHER RESPIRATORY from 03/06/2016 in [place]  Referring Provider  Dr. Lake Bells      Encounter Date: 04/05/2016  Check In:     Session Check In - 04/05/16 1409      Check-In   Location [facility]   Staff Present Aundra Dubin, RN, BSN;Estelene Carmack Luther Parody, BS, EP, Exercise Physiologist   Supervising physician immediately available to respond to emergencies See telemetry face sheet for immediately available MD   Medication changes reported     No   Fall or balance concerns reported    No   Warm-up and Cool-down Performed as group-led instruction   Resistance Training Performed Yes   VAD Patient? No     Pain Assessment   Currently in Pain? No/denies   Pain Score 0-No pain   Multiple Pain Sites No      Capillary Blood Glucose: No results found for this or any previous visit (from the past 24 hour(s)).   Goals Met:  Independence with exercise equipment Improved SOB with ADL's Using PLB without cueing & demonstrates good technique Exercise tolerated well No report of cardiac concerns or symptoms Strength training completed today  Goals Unmet:  Not Applicable  Comments: Check out 230   Dr. Sinda Du is Medical Director for [facility] Pulmonary Rehab.

## 2016-04-10 ENCOUNTER — Encounter (HOSPITAL_COMMUNITY)
Admission: RE | Admit: 2016-04-10 | Discharge: 2016-04-10 | Disposition: A | Discharge status: Home / Self Care | Payer: Medicare Other | Source: Ambulatory Visit | Attending: Pulmonary Disease | Admitting: Pulmonary Disease

## 2016-04-10 DIAGNOSIS — J432 Centrilobular emphysema: Secondary | ICD-10-CM | POA: Diagnosis not present

## 2016-04-10 NOTE — Progress Notes (Signed)
Daily Session Note  Patient Details  Name: Dominic Willis MRN: [id number] Date of Birth: [birth year] Referring Provider:   Flowsheet Row PULMONARY REHAB OTHER RESPIRATORY from 03/06/2016 in [place]  Referring Provider  Dr. Lake Bells      Encounter Date: 04/10/2016  Check In:     Session Check In - 04/10/16 1330      Check-In   Location [facility]   Staff Present Aundra Dubin, RN, BSN;Klayton Monie Luther Parody, BS, EP, Exercise Physiologist   Supervising physician immediately available to respond to emergencies See telemetry face sheet for immediately available MD   Medication changes reported     No   Fall or balance concerns reported    No   Warm-up and Cool-down Performed as group-led instruction   Resistance Training Performed Yes   VAD Patient? No     Pain Assessment   Currently in Pain? No/denies   Pain Score 0-No pain   Multiple Pain Sites No      Capillary Blood Glucose: No results found for this or any previous visit (from the past 24 hour(s)).   Goals Met:  Independence with exercise equipment Improved SOB with ADL's Using PLB without cueing & demonstrates good technique Exercise tolerated well No report of cardiac concerns or symptoms Strength training completed today  Goals Unmet:  Not Applicable  Comments: Check out 230   Dr. Sinda Du is Medical Director for [facility] Pulmonary Rehab.

## 2016-04-12 ENCOUNTER — Encounter (HOSPITAL_COMMUNITY)
Admission: RE | Admit: 2016-04-12 | Discharge: 2016-04-12 | Disposition: A | Discharge status: Home / Self Care | Payer: Medicare Other | Source: Ambulatory Visit | Attending: Pulmonary Disease | Admitting: Pulmonary Disease

## 2016-04-12 DIAGNOSIS — J432 Centrilobular emphysema: Secondary | ICD-10-CM

## 2016-04-12 NOTE — Progress Notes (Signed)
Daily Session Note  Patient Details  Name: Dominic Willis MRN: [id number] Date of Birth: 03/28/1941 Referring Provider:   Flowsheet Row PULMONARY REHAB OTHER RESPIRATORY from 03/06/2016 in [place]  Referring Provider  Dr. Lake Bells      Encounter Date: 04/12/2016  Check In:     Session Check In - 04/12/16 1330      Check-In   Location [facility]   Staff Present Diane Angelina Pih, MS, EP, [facility], Exercise Physiologist;Sanyah Molnar Luther Parody, BS, EP, Exercise Physiologist   Supervising physician immediately available to respond to emergencies See telemetry face sheet for immediately available MD   Medication changes reported     No   Fall or balance concerns reported    No   Warm-up and Cool-down Performed as group-led instruction   Resistance Training Performed Yes   VAD Patient? No     Pain Assessment   Currently in Pain? No/denies   Pain Score 0-No pain   Multiple Pain Sites No      Capillary Blood Glucose: No results found for this or any previous visit (from the past 24 hour(s)).   Goals Met:  Independence with exercise equipment Improved SOB with ADL's Using PLB without cueing & demonstrates good technique Exercise tolerated well No report of cardiac concerns or symptoms Strength training completed today  Goals Unmet:  Not Applicable  Comments: Check out 230   Dr. Sinda Du is Medical Director for [facility] Pulmonary Rehab.

## 2016-04-17 ENCOUNTER — Encounter (HOSPITAL_COMMUNITY)
Admission: RE | Admit: 2016-04-17 | Discharge: 2016-04-17 | Disposition: A | Discharge status: Home / Self Care | Payer: Medicare Other | Source: Ambulatory Visit | Attending: Pulmonary Disease | Admitting: Pulmonary Disease

## 2016-04-17 DIAGNOSIS — J432 Centrilobular emphysema: Secondary | ICD-10-CM | POA: Insufficient documentation

## 2016-04-17 NOTE — Progress Notes (Signed)
Daily Session Note  Patient Details  Name: Dominic Willis MRN: [id number] Date of Birth: [birth year] Referring Provider:   Flowsheet Row PULMONARY REHAB OTHER RESPIRATORY from 03/06/2016 in [place]  Referring Provider  Dr. Lake Bells      Encounter Date: 04/17/2016  Check In:     Session Check In - 04/17/16 1330      Check-In   Location [facility]   Staff Present Diane Angelina Pih, MS, EP, [facility], Exercise Physiologist;Reniyah Gootee Luther Parody, BS, EP, Exercise Physiologist   Supervising physician immediately available to respond to emergencies See telemetry face sheet for immediately available MD   Medication changes reported     No   Fall or balance concerns reported    No   Warm-up and Cool-down Performed as group-led instruction   Resistance Training Performed Yes   VAD Patient? No     Pain Assessment   Currently in Pain? No/denies   Pain Score 0-No pain   Multiple Pain Sites No      Capillary Blood Glucose: No results found for this or any previous visit (from the past 24 hour(s)).   Goals Met:  Independence with exercise equipment Improved SOB with ADL's Using PLB without cueing & demonstrates good technique Exercise tolerated well No report of cardiac concerns or symptoms Strength training completed today  Goals Unmet:  Not Applicable  Comments: Check out 230   Dr. Sinda Du is Medical Director for [facility] Pulmonary Rehab.

## 2016-04-19 ENCOUNTER — Encounter (HOSPITAL_COMMUNITY)
Admission: RE | Admit: 2016-04-19 | Discharge: 2016-04-19 | Disposition: A | Discharge status: Home / Self Care | Payer: Medicare Other | Source: Ambulatory Visit | Attending: Pulmonary Disease | Admitting: Pulmonary Disease

## 2016-04-19 DIAGNOSIS — J432 Centrilobular emphysema: Secondary | ICD-10-CM

## 2016-04-19 NOTE — Progress Notes (Signed)
Daily Session Note  Patient Details  Name: Dominic Willis MRN: [id number] Date of Birth: 10-Aug-1940 Referring Provider:   Flowsheet Row PULMONARY REHAB OTHER RESPIRATORY from 03/06/2016 in [place]  Referring Provider  Dr. Lake Bells      Encounter Date: 04/19/2016  Check In:     Session Check In - 04/19/16 1330      Check-In   Location [facility]   Staff Present Diane Angelina Pih, MS, EP, [facility], Exercise Physiologist;Brysun Eschmann Wynetta Emery, RN, BSN   Supervising physician immediately available to respond to emergencies See telemetry face sheet for immediately available MD   Medication changes reported     No   Fall or balance concerns reported    No   Warm-up and Cool-down Performed as group-led instruction   Resistance Training Performed Yes   VAD Patient? No     Pain Assessment   Currently in Pain? No/denies   Pain Score 0-No pain   Multiple Pain Sites No      Capillary Blood Glucose: No results found for this or any previous visit (from the past 24 hour(s)).   Goals Met:  Independence with exercise equipment Changing diet to healthy choices, watching portion sizes No report of cardiac concerns or symptoms Strength training completed today  Goals Unmet:  Not Applicable  Comments: Check out 1430.   Dr. Sinda Du is Medical Director for [facility] Pulmonary Rehab.

## 2016-04-20 DIAGNOSIS — J441 Chronic obstructive pulmonary disease with (acute) exacerbation: Secondary | ICD-10-CM | POA: Diagnosis not present

## 2016-04-20 DIAGNOSIS — Z87891 Personal history of nicotine dependence: Secondary | ICD-10-CM | POA: Diagnosis not present

## 2016-04-20 DIAGNOSIS — Z299 Encounter for prophylactic measures, unspecified: Secondary | ICD-10-CM | POA: Diagnosis not present

## 2016-04-24 ENCOUNTER — Encounter (HOSPITAL_COMMUNITY)
Admission: RE | Admit: 2016-04-24 | Discharge: 2016-04-24 | Disposition: A | Discharge status: Home / Self Care | Payer: Medicare Other | Source: Ambulatory Visit | Attending: Pulmonary Disease | Admitting: Pulmonary Disease

## 2016-04-24 DIAGNOSIS — J432 Centrilobular emphysema: Secondary | ICD-10-CM | POA: Diagnosis not present

## 2016-04-24 NOTE — Progress Notes (Signed)
Daily Session Note  Patient Details  Name: Dominic Willis MRN: [id number] Date of Birth: 1940/05/20 Referring Provider:   Flowsheet Row PULMONARY REHAB OTHER RESPIRATORY from 03/06/2016 in [place]  Referring Provider  Dr. Lake Bells      Encounter Date: 04/24/2016  Check In:     Session Check In - 04/24/16 1330      Check-In   Location [facility]   Staff Present Russella Dar, MS, EP, [facility], Exercise Physiologist;Neytiri Asche Luther Parody, BS, EP, Exercise Physiologist   Supervising physician immediately available to respond to emergencies See telemetry face sheet for immediately available MD   Medication changes reported     No   Fall or balance concerns reported    No   Warm-up and Cool-down Performed as group-led instruction   Resistance Training Performed Yes   VAD Patient? No     Pain Assessment   Currently in Pain? No/denies   Pain Score 0-No pain   Multiple Pain Sites No      Capillary Blood Glucose: No results found for this or any previous visit (from the past 24 hour(s)).      Exercise Prescription Changes - 04/23/16 1400      Exercise Review   Progression Yes     Response to Exercise   Blood Pressure (Admit) 150/70   Blood Pressure (Exercise) 180/80   Blood Pressure (Exit) 160/70   Heart Rate (Admit) 90 bpm   Heart Rate (Exercise) 96 bpm   Heart Rate (Exit) 94 bpm   Oxygen Saturation (Admit) 94 %   Oxygen Saturation (Exercise) 97 %   Oxygen Saturation (Exit) 96 %   Rating of Perceived Exertion (Exercise) 11   Perceived Dyspnea (Exercise) 12   Duration Progress to 30 minutes of continuous aerobic without signs/symptoms of physical distress   Intensity Rest + 20     Progression   Progression Continue progressive overload as per policy without signs/symptoms or physical distress.     Resistance Training   Training Prescription Yes   Weight 2   Reps 10-12     Treadmill   MPH 2   Grade 0   Minutes 20   METs 2.53      NuStep   Level 2   Watts 27   Minutes 20   METs 3.58     Home Exercise Plan   Plans to continue exercise at Home   Frequency Add 2 additional days to program exercise sessions.     Goals Met:  Independence with exercise equipment Improved SOB with ADL's Using PLB without cueing & demonstrates good technique Exercise tolerated well No report of cardiac concerns or symptoms Strength training completed today  Goals Unmet:  Not Applicable  Comments: Check out 230   Dr. Sinda Du is Medical Director for [facility] Pulmonary Rehab.

## 2016-04-25 NOTE — Progress Notes (Signed)
Pulmonary Individual Treatment Plan  Patient Details  Name: Dominic Willis MRN: [id number] Date of Birth: 20-May-1940 Referring Provider:   Flowsheet Row PULMONARY REHAB OTHER RESPIRATORY from 03/06/2016 in [place]  Referring Provider  Dr. Lake Bells      Initial Encounter Date:  Flowsheet Row PULMONARY REHAB OTHER RESPIRATORY from 03/06/2016 in [place]  Date  03/01/16  Referring Provider  Dr. Lake Bells      Visit Diagnosis: Centrilobular emphysema ([place])  Patient's Home Medications on Admission:   Current Outpatient Prescriptions:  .  albuterol (PROVENTIL HFA;VENTOLIN HFA) 108 (90 Base) MCG/ACT inhaler, Inhale 2-4 puffs into the lungs every 6 (six) hours as needed for wheezing or shortness of breath., Disp: , Rfl:  .  albuterol (PROVENTIL) (2.5 MG/3ML) 0.083% nebulizer solution, Take 2.5 mg by nebulization every 6 (six) hours as needed for wheezing or shortness of breath., Disp: , Rfl:  .  ALPRAZolam (XANAX) 1 MG tablet, Take 1 mg by mouth at bedtime., Disp: , Rfl:  .  aspirin EC 81 MG tablet, Take 81 mg by mouth daily., Disp: , Rfl:  .  Cyanocobalamin (B-12 SL), Place 1 tablet under the tongue daily., Disp: , Rfl:  .  doxycycline (VIBRA-TABS) 100 MG tablet, Take 100 mg by mouth daily., Disp: , Rfl:  .  fluticasone-salmeterol (ADVAIR HFA) 230-21 MCG/ACT inhaler, Inhale 2 puffs into the lungs 2 (two) times daily., Disp: 1 Inhaler, Rfl: 5 .  loratadine-pseudoephedrine (CLARITIN-D 12-HOUR) 5-120 MG tablet, Take 1 tablet by mouth 2 (two) times daily., Disp: , Rfl:  .  losartan (COZAAR) 50 MG tablet, Take 50 mg by mouth daily., Disp: , Rfl:  .  Multiple Vitamins-Minerals (VISION VITAMINS PO), Take 1 tablet by mouth 2 (two) times daily., Disp: , Rfl:  .  Naproxen Sodium (ALEVE PO), Take 1 tablet by mouth 2 (two) times daily as needed (pain)., Disp: , Rfl:  .  pantoprazole (PROTONIX) 40 MG tablet, Take 40 mg by mouth daily., Disp: , Rfl:  .   tiotropium (SPIRIVA) 18 MCG inhalation capsule, Place 18 mcg into inhaler and inhale daily., Disp: , Rfl:  .  vitamin E 400 UNIT capsule, Take 400 Units by mouth daily., Disp: , Rfl:   Past Medical History: Past Medical History:  Diagnosis Date  . Actinic keratosis   . Asthma   . COPD (chronic obstructive pulmonary disease) ([place])   . Hypertension   . Insomnia   . Seasonal allergies   . Tobacco abuse     Tobacco Use: History  Smoking Status  . Former Smoker  . Packs/day: 1.00  . Years: 57.00  . Types: Cigarettes  . Start date: 03/27/1958  . Quit date: 12/16/2015  Smokeless Tobacco  . Never Used    Comment: currently smoking 0.5ppd    Labs: Recent Review Flowsheet Data    There is no flowsheet data to display.      Capillary Blood Glucose: No results found for: GLUCAP   ADL UCSD:     Pulmonary Assessment Scores    Row Name 03/01/16 1601         ADL UCSD   ADL Phase Entry     SOB Score total 90     Rest 3     Walk 12     Stairs 4     Bath 4     Dress 4     Shop 4       CAT Score   CAT Score 31  mMRC Score   mMRC Score 4        Pulmonary Function Assessment:   Exercise Target Goals:    Exercise Program Goal: Individual exercise prescription set with THRR, safety & activity barriers. Participant demonstrates ability to understand and report RPE using BORG scale, to self-measure pulse accurately, and to acknowledge the importance of the exercise prescription.  Exercise Prescription Goal: Starting with aerobic activity 30 plus minutes a day, 3 days per week for initial exercise prescription. Provide home exercise prescription and guidelines that participant acknowledges understanding prior to discharge.  Activity Barriers & Risk Stratification:     Activity Barriers & Cardiac Risk Stratification - 03/01/16 1559      Activity Barriers & Cardiac Risk Stratification   Activity Barriers Shortness of Breath   Cardiac Risk Stratification High       6 Minute Walk:     6 Minute Walk    Row Name 03/01/16 1635         6 Minute Walk   Phase Initial     Distance 1000 feet     Walk Time 6 minutes     # of Rest Breaks 0     MPH 1.89     METS 2.45     RPE 17     Perceived Dyspnea  16     VO2 Peak 7.43     Symptoms Yes (comment)     Comments Extreme SOB. Practiced pursed lip breathing until breathing was back to WNL.      Resting HR 81 bpm     Resting BP 170/80     Max Ex. HR 106 bpm     Max Ex. BP 188/84     2 Minute Post BP 140/82        Initial Exercise Prescription:     Initial Exercise Prescription - 03/06/16 1100      Date of Initial Exercise RX and Referring Provider   Date 03/01/16   Referring Provider Dr. Lake Bells     Treadmill   MPH 1   Grade 0   Minutes 15   METs 1.7     NuStep   Level 2   Watts 15   Minutes 20   METs 1.9     Prescription Details   Frequency (times per week) 2   Duration Progress to 30 minutes of continuous aerobic without signs/symptoms of physical distress     Intensity   THRR REST +  20   THRR 40-80% of Max Heartrate 107-120-132   Ratings of Perceived Exertion 11-13   Perceived Dyspnea 0-4     Progression   Progression Continue progressive overload as per policy without signs/symptoms or physical distress.     Resistance Training   Training Prescription Yes   Weight 1   Reps 10-12      Perform Capillary Blood Glucose checks as needed.  Exercise Prescription Changes:      Exercise Prescription Changes    Row Name 03/22/16 1500 04/23/16 1400           Exercise Review   Progression Yes Yes        Response to Exercise   Blood Pressure (Admit) 160/80 150/70      Blood Pressure (Exercise) 180/84 180/80      Blood Pressure (Exit) 156/74 160/70      Heart Rate (Admit) 91 bpm 90 bpm      Heart Rate (Exercise) 91 bpm 96 bpm      Heart  Rate (Exit) 95 bpm 94 bpm      Oxygen Saturation (Admit) 94 % 94 %      Oxygen Saturation (Exercise) 97 % 97 %       Oxygen Saturation (Exit) 95 % 96 %      Rating of Perceived Exertion (Exercise) 10 11      Perceived Dyspnea (Exercise) 13 12      Duration Progress to 30 minutes of continuous aerobic without signs/symptoms of physical distress Progress to 30 minutes of continuous aerobic without signs/symptoms of physical distress      Intensity Rest + 20 Rest + 20        Progression   Progression Continue progressive overload as per policy without signs/symptoms or physical distress. Continue progressive overload as per policy without signs/symptoms or physical distress.        Resistance Training   Training Prescription Yes Yes      Weight 2 2      Reps 10-12 10-12        Treadmill   MPH 1.3 2      Grade 0 0      Minutes 20 20      METs 1.9 2.53        NuStep   Level 2 2      Watts 33 27      Minutes 20 20      METs 3.57 3.58        Home Exercise Plan   Plans to continue exercise at [facility] 2 additional days to program exercise sessions. Add 2 additional days to program exercise sessions.         Exercise Comments:      Exercise Comments    Row Name 03/22/16 1539 04/23/16 1406         Exercise Comments Patient is progressing appropriately  Patient is progressing well.         Discharge Exercise Prescription (Final Exercise Prescription Changes):     Exercise Prescription Changes - 04/23/16 1400      Exercise Review   Progression Yes     Response to Exercise   Blood Pressure (Admit) 150/70   Blood Pressure (Exercise) 180/80   Blood Pressure (Exit) 160/70   Heart Rate (Admit) 90 bpm   Heart Rate (Exercise) 96 bpm   Heart Rate (Exit) 94 bpm   Oxygen Saturation (Admit) 94 %   Oxygen Saturation (Exercise) 97 %   Oxygen Saturation (Exit) 96 %   Rating of Perceived Exertion (Exercise) 11   Perceived Dyspnea (Exercise) 12   Duration Progress to 30 minutes of continuous aerobic without signs/symptoms of physical distress   Intensity Rest + 20      Progression   Progression Continue progressive overload as per policy without signs/symptoms or physical distress.     Resistance Training   Training Prescription Yes   Weight 2   Reps 10-12     Treadmill   MPH 2   Grade 0   Minutes 20   METs 2.53     NuStep   Level 2   Watts 27   Minutes 20   METs 3.58     Home Exercise Plan   Plans to continue exercise at Home   Frequency Add 2 additional days to program exercise sessions.      Nutrition:  Target Goals: Understanding of nutrition guidelines, daily intake of sodium <1563m, cholesterol <2061m calories 30% from fat  and 7% or less from saturated fats, daily to have 5 or more servings of fruits and vegetables.  Biometrics:     Pre Biometrics - 03/01/16 1640      Pre Biometrics   Height _0  (1.727 m)   Weight 186 lb 4.8 oz (84.5 kg)   Waist Circumference 38.5 inches   Hip Circumference 40.5 inches   Waist to Hip Ratio 0.95 %   BMI (Calculated) 28.4   Triceps Skinfold 18 mm   % Body Fat 28 %   Grip Strength 66 kg   Flexibility 0 in   Single Leg Stand 2 seconds       Nutrition Therapy Plan and Nutrition Goals:   Nutrition Discharge: Rate Your Plate Scores:     Nutrition Assessments - 03/01/16 1603      Rate Your Plate Scores   Pre Score 47   Pre Score % 47 %      Psychosocial: Target Goals: Acknowledge presence or absence of depression, maximize coping skills, provide positive support system. Participant is able to verbalize types and ability to use techniques and skills needed for reducing stress and depression.  Initial Review & Psychosocial Screening:     Initial Psych Review & Screening - 03/01/16 [place]? Yes   Concerns Recent loss of child   Comments Patient says he is not depressed and does not need counseling. He does have down days and is still grieving the loss of his son 1 year ago.      Screening Interventions   Interventions Encouraged to  exercise      Quality of Life Scores:     Quality of Life - 03/01/16 1641      Quality of Life Scores   Health/Function Pre 20.81 %   Socioeconomic Pre 20.83 %   Psych/Spiritual Pre 21 %   Family Pre 21 %   GLOBAL Pre 20.88 %      PHQ-9: Recent Review Flowsheet Data    Depression screen [facility] 2/9 03/01/2016   Decreased Interest 0   Down, Depressed, Hopeless 0   PHQ - 2 Score 0   Altered sleeping 1   Tired, decreased energy 3   Change in appetite 0   Feeling bad or failure about yourself  2   Trouble concentrating 1   Moving slowly or fidgety/restless 1   Suicidal thoughts 0   PHQ-9 Score 8   Difficult doing work/chores Somewhat difficult      Psychosocial Evaluation and Intervention:     Psychosocial Evaluation - 03/01/16 1609      Psychosocial Evaluation & Interventions   Interventions Encouraged to exercise with the program and follow exercise prescription   Comments Patient is grieving the loss of his son but says he is not depressed and does not need counseling.       Psychosocial Re-Evaluation:     Psychosocial Re-Evaluation    [place] Name 03/29/16 1531 04/25/16 1421           Psychosocial Re-Evaluation   Interventions Encouraged to attend Pulmonary Rehabilitation for the exercise Encouraged to attend Pulmonary Rehabilitation for the exercise      Comments Patient's QOL score was 20.88 and his PHQ-9 score was 8 indicating some depression. He says he has some days he feels depression but feels he does not need counseling. Continues to grieve the loss of a child. Patient continues to have no psychosocial issues.  Continued Psychosocial Services Needed No No         Education: Education Goals: Education classes will be provided on a weekly basis, covering required topics. Participant will state understanding/return demonstration of topics presented.  Learning Barriers/Preferences:     Learning Barriers/Preferences - 03/01/16 1559      Learning  Barriers/Preferences   Learning Barriers None   Learning Preferences Skilled Demonstration      Education Topics: How Lungs Work and Diseases: - Discuss the anatomy of the lungs and diseases that can affect the lungs, such as COPD.   Exercise: -Discuss the importance of exercise, FITT principles of exercise, normal and abnormal responses to exercise, and how to exercise safely.   Environmental Irritants: -Discuss types of environmental irritants and how to limit exposure to environmental irritants.   Meds/Inhalers and oxygen: - Discuss respiratory medications, definition of an inhaler and oxygen, and the proper way to use an inhaler and oxygen.   Energy Saving Techniques: - Discuss methods to conserve energy and decrease shortness of breath when performing activities of daily living.  Flowsheet Row PULMONARY REHAB OTHER RESPIRATORY from 04/19/2016 in [place]  Date  03/15/16  Educator  Nils Flack  Instruction Review Code  2- meets goals/outcomes      Bronchial Hygiene / Breathing Techniques: - Discuss breathing mechanics, pursed-lip breathing technique,  proper posture, effective ways to clear airways, and other functional breathing techniques Flowsheet Row PULMONARY REHAB OTHER RESPIRATORY from 04/19/2016 in [place]  Date  03/22/16  Educator  Nils Flack  Instruction Review Code  2- meets goals/outcomes      Cleaning Equipment: - Provides group verbal and written instruction about the health risks of elevated stress, cause of high stress, and healthy ways to reduce stress. Flowsheet Row PULMONARY REHAB OTHER RESPIRATORY from 04/19/2016 in [facility]  Date  03/22/16  Educator  Russella Dar  Instruction Review Code  2- meets goals/outcomes      Nutrition I: Fats: - Discuss the types of cholesterol, what cholesterol does to the body, and how cholesterol levels can be controlled. Flowsheet Row PULMONARY  REHAB OTHER RESPIRATORY from 04/19/2016 in [place]  Date  04/05/16  Educator  Suzanne Boron  Instruction Review Code  2- meets goals/outcomes      Nutrition II: Labels: -Discuss the different components of food labels and how to read food labels. Flowsheet Row PULMONARY REHAB OTHER RESPIRATORY from 04/19/2016 in [facility]  Date  04/12/16  Educator  Nils Flack  Instruction Review Code  2- meets goals/outcomes      Respiratory Infections: - Discuss the signs and symptoms of respiratory infections, ways to prevent respiratory infections, and the importance of seeking medical treatment when having a respiratory infection. Flowsheet Row PULMONARY REHAB OTHER RESPIRATORY from 04/19/2016 in [facility]  Date  04/19/16  Educator  Lisabeth Register  Instruction Review Code  2- meets goals/outcomes      Stress I: Signs and Symptoms: - Discuss the causes of stress, how stress may lead to anxiety and depression, and ways to limit stress.   Stress II: Relaxation: -Discuss relaxation techniques to limit stress.   Oxygen for Home/Travel: - Discuss how to prepare for travel when on oxygen and proper ways to transport and store oxygen to ensure safety.   Knowledge Questionnaire Score:     Knowledge Questionnaire Score - 03/01/16 1600      Knowledge Questionnaire Score   Pre  Score 9/14      Core Components/Risk Factors/Patient Goals at Admission:     Personal Goals and Risk Factors at Admission - 03/01/16 1604      Core Components/Risk Factors/Patient Goals on Admission    Weight Management Obesity   Intervention Weight Management: Develop a combined nutrition and exercise program designed to reach desired caloric intake, while maintaining appropriate intake of nutrient and fiber, sodium and fats, and appropriate energy expenditure required for the weight goal.   Sedentary Yes   Intervention Provide advice,  education, support and counseling about physical activity/exercise needs.;Develop an individualized exercise prescription for aerobic and resistive training based on initial evaluation findings, risk stratification, comorbidities and participant's personal goals.   Expected Outcomes Achievement of increased cardiorespiratory fitness and enhanced flexibility, muscular endurance and strength shown through measurements of functional capacity and personal statement of participant.   Increase Strength and Stamina Yes   Intervention Provide advice, education, support and counseling about physical activity/exercise needs.;Develop an individualized exercise prescription for aerobic and resistive training based on initial evaluation findings, risk stratification, comorbidities and participant's personal goals.   Expected Outcomes Achievement of increased cardiorespiratory fitness and enhanced flexibility, muscular endurance and strength shown through measurements of functional capacity and personal statement of participant.   Improve shortness of breath with ADL's Yes   Intervention Provide education, individualized exercise plan and daily activity instruction to help decrease symptoms of SOB with activities of daily living.   Expected Outcomes Short Term: Achieves a reduction of symptoms when performing activities of daily living.   Develop more efficient breathing techniques such as purse lipped breathing and diaphragmatic breathing; and practicing self-pacing with activity Yes   Intervention Provide education, demonstration and support about specific breathing techniuqes utilized for more efficient breathing. Include techniques such as pursed lipped breathing, diaphragmatic breathing and self-pacing activity.   Expected Outcomes Short Term: Participant will be able to demonstrate and use breathing techniques as needed throughout daily activities.   Stress Yes   Intervention Offer individual and/or small group  education and counseling on adjustment to heart disease, stress management and health-related lifestyle change. Teach and support self-help strategies.   Expected Outcomes Long Term: Emotional wellbeing is indicated by absence of clinically significant psychosocial distress or social isolation.   Personal Goal Be able to do more ADL's without SOB, Breathe better overall. Be able to do the things he used to do-yard work; fishing; hunting.    Intervention Patient will attend pulmonary rehab 2 days/week and supplement with exercise at home 3 days/week.    Expected Outcomes Patient will meet his personal goals.       Core Components/Risk Factors/Patient Goals Review:      Goals and Risk Factor Review    Row Name 03/01/16 1607 03/29/16 1529 04/25/16 1417         Core Components/Risk Factors/Patient Goals Review   Personal Goals Review Weight Management/Obesity;Sedentary;Increase Strength and Stamina;Improve shortness of breath with ADL's;Develop more efficient breathing techniques such as purse lipped breathing and diaphragmatic breathing and practicing self-pacing with activity.;Stress Weight Management/Obesity;Increase Strength and Stamina;Develop more efficient breathing techniques such as purse lipped breathing and diaphragmatic breathing and practicing self-pacing with activity.;Improve shortness of breath with ADL's;Increase knowledge of respiratory medications and ability to use respiratory devices properly.;Stress  Be able to do more ADL's without SOB, Breathe better overall. Be able to do the things he used to do-yard work; fishing; hunting.  Sedentary;Increase Strength and Stamina;Develop more efficient breathing techniques such as purse lipped breathing  and diaphragmatic breathing and practicing self-pacing with activity.;Improve shortness of breath with ADL's;Weight Management/Obesity;Stress  Be able to do more ADL's without SOB, Breathe better overall. Be able to do the things he used to  do-yard work; fishing; hunting.      Review  - Patient has attended 7 sessions. He is progressing with some increase in his strength and stamina. He has maintained his weight. Will continue to monitor for progress. Patient has attended 13 sessions gaining 1.7 lbs since starting the program. Patient is progressing well in the program with increased strength and stamina and improvement in his SOB.      Expected Outcomes  - Patient will continue to attend sessions meeting her personal goals.  Patient will continue to attend sessions completing the program meeting his personal goals.         Core Components/Risk Factors/Patient Goals at Discharge (Final Review):      Goals and Risk Factor Review - 04/25/16 1417      Core Components/Risk Factors/Patient Goals Review   Personal Goals Review Sedentary;Increase Strength and Stamina;Develop more efficient breathing techniques such as purse lipped breathing and diaphragmatic breathing and practicing self-pacing with activity.;Improve shortness of breath with ADL's;Weight Management/Obesity;Stress  Be able to do more ADL's without SOB, Breathe better overall. Be able to do the things he used to do-yard work; fishing; hunting.    Review Patient has attended 13 sessions gaining 1.7 lbs since starting the program. Patient is progressing well in the program with increased strength and stamina and improvement in his SOB.    Expected Outcomes Patient will continue to attend sessions completing the program meeting his personal goals.       ITP Comments:     ITP Comments    Row Name 03/22/16 1254           ITP Comments Patient met with Registered Dietitian to discuss nutrition topics including: Heart healthty eating, heart health cooking and make smart choices when shopping; Portion control; weight management; and hydration. Patient attended a group session with the hospital chaplian called [facility] to discuss and share how his recent pulmomary  diagnosis has effected his life.          Comments: ITP 30 Day REVIEW Pt is making expected progress toward pulmonary rehab goals after completing 13 sessions. Recommend continued exercise, life style modification, education, and utilization of breathing techniques to increase stamina and strength and decrease shortness of breath with exertion.

## 2016-04-26 ENCOUNTER — Encounter (HOSPITAL_COMMUNITY)
Admission: RE | Admit: 2016-04-26 | Discharge: 2016-04-26 | Disposition: A | Discharge status: Home / Self Care | Payer: Medicare Other | Source: Ambulatory Visit | Attending: Pulmonary Disease | Admitting: Pulmonary Disease

## 2016-04-26 DIAGNOSIS — J432 Centrilobular emphysema: Secondary | ICD-10-CM

## 2016-04-26 NOTE — Progress Notes (Signed)
Daily Session Note  Patient Details  Name: [name] MRN: [id number] Date of Birth: 02/10/1941 Referring Provider:   Flowsheet Row PULMONARY REHAB OTHER RESPIRATORY from 03/06/2016 in [facility]  Referring Provider  Dr. Mcquaid      Encounter Date: 04/26/2016  Check In:     Session Check In - 04/26/16 1330      Check-In   Location [facility]   Staff Present Debra Johnson, RN, BSN;Jermery Caratachea, BS, EP, Exercise Physiologist   Supervising physician immediately available to respond to emergencies See telemetry face sheet for immediately available MD   Medication changes reported     No   Fall or balance concerns reported    No   Warm-up and Cool-down Performed as group-led instruction   Resistance Training Performed Yes   VAD Patient? No     Pain Assessment   Currently in Pain? No/denies   Pain Score 0-No pain   Multiple Pain Sites No      Capillary Blood Glucose: No results found for this or any previous visit (from the past 24 hour(s)).   Goals Met:  Independence with exercise equipment Improved SOB with ADL's Using PLB without cueing & demonstrates good technique Exercise tolerated well No report of cardiac concerns or symptoms Strength training completed today  Goals Unmet:  Not Applicable  Comments: Check out 230   Dr. Edward Hawkins is Medical Director for [facility]. 

## 2016-05-01 ENCOUNTER — Encounter (HOSPITAL_COMMUNITY)
Admission: RE | Admit: 2016-05-01 | Discharge: 2016-05-01 | Disposition: A | Discharge status: Home / Self Care | Payer: Medicare Other | Source: Ambulatory Visit | Attending: Pulmonary Disease | Admitting: Pulmonary Disease

## 2016-05-01 DIAGNOSIS — J449 Chronic obstructive pulmonary disease, unspecified: Secondary | ICD-10-CM | POA: Diagnosis not present

## 2016-05-01 DIAGNOSIS — J432 Centrilobular emphysema: Secondary | ICD-10-CM | POA: Diagnosis not present

## 2016-05-01 DIAGNOSIS — I10 Essential (primary) hypertension: Secondary | ICD-10-CM | POA: Diagnosis not present

## 2016-05-01 DIAGNOSIS — Z299 Encounter for prophylactic measures, unspecified: Secondary | ICD-10-CM | POA: Diagnosis not present

## 2016-05-01 DIAGNOSIS — Z6829 Body mass index (BMI) 29.0-29.9, adult: Secondary | ICD-10-CM | POA: Diagnosis not present

## 2016-05-01 DIAGNOSIS — Z87891 Personal history of nicotine dependence: Secondary | ICD-10-CM | POA: Diagnosis not present

## 2016-05-01 DIAGNOSIS — Z713 Dietary counseling and surveillance: Secondary | ICD-10-CM | POA: Diagnosis not present

## 2016-05-01 DIAGNOSIS — G4700 Insomnia, unspecified: Secondary | ICD-10-CM | POA: Diagnosis not present

## 2016-05-01 NOTE — Progress Notes (Signed)
Daily Session Note  Patient Details  Name: Dominic Willis MRN: [id number] Date of Birth: 1940-05-24 Referring Provider:   Flowsheet Row PULMONARY REHAB OTHER RESPIRATORY from 03/06/2016 in [place]  Referring Provider  Dr. Lake Bells      Encounter Date: 05/01/2016  Check In:     Session Check In - 05/01/16 1330      Check-In   Location [facility]   Staff Present Russella Dar, MS, EP, [facility], Exercise Physiologist;Venezia Sargeant Luther Parody, BS, EP, Exercise Physiologist   Supervising physician immediately available to respond to emergencies See telemetry face sheet for immediately available MD   Medication changes reported     No   Fall or balance concerns reported    No   Warm-up and Cool-down Performed as group-led instruction   Resistance Training Performed Yes   VAD Patient? No     Pain Assessment   Currently in Pain? No/denies   Pain Score 0-No pain   Multiple Pain Sites No      Capillary Blood Glucose: No results found for this or any previous visit (from the past 24 hour(s)).   Goals Met:  Independence with exercise equipment Improved SOB with ADL's Using PLB without cueing & demonstrates good technique Exercise tolerated well No report of cardiac concerns or symptoms Strength training completed today  Goals Unmet:  Not Applicable  Comments: Check out 230   Dr. Sinda Du is Medical Director for [facility].

## 2016-05-03 ENCOUNTER — Encounter (HOSPITAL_COMMUNITY): Payer: Medicare Other

## 2016-05-08 ENCOUNTER — Encounter (HOSPITAL_COMMUNITY)
Admission: RE | Admit: 2016-05-08 | Discharge: 2016-05-08 | Disposition: A | Discharge status: Home / Self Care | Payer: Medicare Other | Source: Ambulatory Visit | Attending: Pulmonary Disease | Admitting: Pulmonary Disease

## 2016-05-08 DIAGNOSIS — J432 Centrilobular emphysema: Secondary | ICD-10-CM

## 2016-05-08 NOTE — Progress Notes (Signed)
Daily Session Note  Patient Details  Name: Dominic Willis MRN: [id number] Date of Birth: Aug 23, 1940 Referring Provider:   Flowsheet Row PULMONARY REHAB OTHER RESPIRATORY from 03/06/2016 in [place]  Referring Provider  Dr. Lake Bells      Encounter Date: 05/08/2016  Check In:     Session Check In - 05/08/16 1330      Check-In   Location [facility]   Staff Present Diane Angelina Pih, MS, EP, [facility], Exercise Physiologist;Kelan Pritt Luther Parody, BS, EP, Exercise Physiologist   Supervising physician immediately available to respond to emergencies See telemetry face sheet for immediately available MD   Medication changes reported     No   Fall or balance concerns reported    No   Warm-up and Cool-down Performed as group-led instruction   Resistance Training Performed Yes   VAD Patient? No     Pain Assessment   Currently in Pain? No/denies   Pain Score 0-No pain   Multiple Pain Sites No      Capillary Blood Glucose: No results found for this or any previous visit (from the past 24 hour(s)).   Goals Met:  Independence with exercise equipment Improved SOB with ADL's Using PLB without cueing & demonstrates good technique Exercise tolerated well No report of cardiac concerns or symptoms Strength training completed today  Goals Unmet:  Not Applicable  Comments: Check out 230   Dr. Sinda Du is Medical Director for [facility] Pulmonary Rehab.

## 2016-05-10 ENCOUNTER — Encounter (HOSPITAL_COMMUNITY)
Admission: RE | Admit: 2016-05-10 | Discharge: 2016-05-10 | Disposition: A | Discharge status: Home / Self Care | Payer: Medicare Other | Source: Ambulatory Visit | Attending: Pulmonary Disease | Admitting: Pulmonary Disease

## 2016-05-10 DIAGNOSIS — J432 Centrilobular emphysema: Secondary | ICD-10-CM | POA: Diagnosis not present

## 2016-05-10 NOTE — Progress Notes (Signed)
Daily Session Note  Patient Details  Name: [name] MRN: [id number] Date of Birth: [birth year] Referring Provider:   Flowsheet Row PULMONARY REHAB OTHER RESPIRATORY from 03/06/2016 in [place]  Referring Provider  Dr. Lake Bells      Encounter Date: 05/10/2016  Check In:     Session Check In - 05/10/16 1330      Check-In   Location [facility]   Staff Present Suzanne Boron, BS, EP, Exercise Physiologist;Debra Wynetta Emery, RN, BSN   Supervising physician immediately available to respond to emergencies See telemetry face sheet for immediately available MD   Medication changes reported     No   Fall or balance concerns reported    No   Warm-up and Cool-down Performed as group-led instruction   Resistance Training Performed Yes   VAD Patient? No     Pain Assessment   Currently in Pain? No/denies   Pain Score 0-No pain   Multiple Pain Sites No      Capillary Blood Glucose: No results found for this or any previous visit (from the past 24 hour(s)).   Goals Met:  Independence with exercise equipment Improved SOB with ADL's Using PLB without cueing & demonstrates good technique Exercise tolerated well No report of cardiac concerns or symptoms Strength training completed today  Goals Unmet:  Not Applicable  Comments: Check out 203   Dr. Sinda Du is Medical Director for [facility] Pulmonary Rehab.

## 2016-05-15 ENCOUNTER — Encounter (HOSPITAL_COMMUNITY)
Admission: RE | Admit: 2016-05-15 | Discharge: 2016-05-15 | Disposition: A | Discharge status: Home / Self Care | Payer: Medicare Other | Source: Ambulatory Visit | Attending: Pulmonary Disease | Admitting: Pulmonary Disease

## 2016-05-15 DIAGNOSIS — J432 Centrilobular emphysema: Secondary | ICD-10-CM | POA: Diagnosis not present

## 2016-05-15 NOTE — Progress Notes (Signed)
Daily Session Note  Patient Details  Name: Dominic Willis MRN: [id number] Date of Birth: [birth year] Referring Provider:   Flowsheet Row PULMONARY REHAB OTHER RESPIRATORY from 03/06/2016 in [place]  Referring Provider  Dr. Lake Bells      Encounter Date: 05/15/2016  Check In:     Session Check In - 05/15/16 1330      Check-In   Location [facility]   Staff Present Suzanne Boron, BS, EP, Exercise Physiologist   Supervising physician immediately available to respond to emergencies See telemetry face sheet for immediately available MD   Medication changes reported     No   Fall or balance concerns reported    No   Warm-up and Cool-down Performed as group-led instruction   Resistance Training Performed Yes   VAD Patient? No     Pain Assessment   Currently in Pain? No/denies   Pain Score 0-No pain   Multiple Pain Sites No      Capillary Blood Glucose: No results found for this or any previous visit (from the past 24 hour(s)).   Goals Met:  Independence with exercise equipment Improved SOB with ADL's Using PLB without cueing & demonstrates good technique Exercise tolerated well No report of cardiac concerns or symptoms Strength training completed today  Goals Unmet:  Not Applicable  Comments: Check out 230   Dr. Sinda Du is Medical Director for [facility] Pulmonary Rehab.

## 2016-05-17 ENCOUNTER — Encounter (HOSPITAL_COMMUNITY)
Admission: RE | Admit: 2016-05-17 | Discharge: 2016-05-17 | Disposition: A | Discharge status: Home / Self Care | Payer: Medicare Other | Source: Ambulatory Visit | Attending: Pulmonary Disease | Admitting: Pulmonary Disease

## 2016-05-17 DIAGNOSIS — J432 Centrilobular emphysema: Secondary | ICD-10-CM | POA: Diagnosis not present

## 2016-05-17 NOTE — Progress Notes (Signed)
Daily Session Note  Patient Details  Name: Dominic Willis MRN: [id number] Date of Birth: [birth year] Referring Provider:   Flowsheet Row PULMONARY REHAB OTHER RESPIRATORY from 03/06/2016 in [place]  Referring Provider  Dr. Lake Bells      Encounter Date: 05/17/2016  Check In:     Session Check In - 05/17/16 1330      Check-In   Location [facility]   Staff Present Diane Angelina Pih, MS, EP, [facility], Exercise Physiologist;Timika Muench Luther Parody, BS, EP, Exercise Physiologist   Supervising physician immediately available to respond to emergencies See telemetry face sheet for immediately available MD   Medication changes reported     No   Fall or balance concerns reported    No   Warm-up and Cool-down Performed as group-led instruction   Resistance Training Performed Yes   VAD Patient? No     Pain Assessment   Currently in Pain? No/denies   Pain Score 0-No pain   Multiple Pain Sites No      Capillary Blood Glucose: No results found for this or any previous visit (from the past 24 hour(s)).   Goals Met:  Independence with exercise equipment Improved SOB with ADL's Using PLB without cueing & demonstrates good technique Exercise tolerated well No report of cardiac concerns or symptoms Strength training completed today  Goals Unmet:  Not Applicable  Comments: Check out 230   Dr. Sinda Du is Medical Director for [facility] Pulmonary Rehab.

## 2016-05-22 ENCOUNTER — Encounter (HOSPITAL_COMMUNITY)
Admission: RE | Admit: 2016-05-22 | Discharge: 2016-05-22 | Disposition: A | Discharge status: Home / Self Care | Payer: Medicare Other | Source: Ambulatory Visit | Attending: Pulmonary Disease | Admitting: Pulmonary Disease

## 2016-05-22 DIAGNOSIS — J432 Centrilobular emphysema: Secondary | ICD-10-CM | POA: Diagnosis not present

## 2016-05-22 NOTE — Progress Notes (Signed)
Daily Session Note  Patient Details  Name: Dominic Willis MRN: [id number] Date of Birth: [birth year] Referring Provider:   Flowsheet Row PULMONARY REHAB OTHER RESPIRATORY from 03/06/2016 in [place]  Referring Provider  Dr. Lake Bells      Encounter Date: 05/22/2016  Check In:     Session Check In - 05/22/16 1300      Check-In   Location [facility]   Staff Present Diane Angelina Pih, MS, EP, [facility], Exercise Physiologist;[name], BS, EP, Exercise Physiologist   Supervising physician immediately available to respond to emergencies See telemetry face sheet for immediately available MD   Medication changes reported     No   Fall or balance concerns reported    No   Warm-up and Cool-down Performed as group-led instruction   Resistance Training Performed Yes   VAD Patient? No     Pain Assessment   Currently in Pain? No/denies   Pain Score 0-No pain   Multiple Pain Sites No      Capillary Blood Glucose: No results found for this or any previous visit (from the past 24 hour(s)).   Goals Met:  Independence with exercise equipment Improved SOB with ADL's Using PLB without cueing & demonstrates good technique Exercise tolerated well No report of cardiac concerns or symptoms Strength training completed today  Goals Unmet:  Not Applicable  Comments: Check out 230   Dr. Sinda Du is Medical Director for [facility] Pulmonary Rehab.

## 2016-05-24 ENCOUNTER — Encounter (HOSPITAL_COMMUNITY)
Admission: RE | Admit: 2016-05-24 | Discharge: 2016-05-24 | Disposition: A | Discharge status: Home / Self Care | Payer: Medicare Other | Source: Ambulatory Visit | Attending: Pulmonary Disease | Admitting: Pulmonary Disease

## 2016-05-24 DIAGNOSIS — J432 Centrilobular emphysema: Secondary | ICD-10-CM

## 2016-05-24 NOTE — Progress Notes (Signed)
Pulmonary Individual Treatment Plan  Patient Details  Name: Dominic Willis MRN: [id number] Date of Birth: 20-May-1940 Referring Provider:   Flowsheet Row PULMONARY REHAB OTHER RESPIRATORY from 03/06/2016 in [place]  Referring Provider  Dr. Lake Bells      Initial Encounter Date:  Flowsheet Row PULMONARY REHAB OTHER RESPIRATORY from 03/06/2016 in [place]  Date  03/01/16  Referring Provider  Dr. Lake Bells      Visit Diagnosis: Centrilobular emphysema ([place])  Patient's Home Medications on Admission:   Current Outpatient Prescriptions:  .  albuterol (PROVENTIL HFA;VENTOLIN HFA) 108 (90 Base) MCG/ACT inhaler, Inhale 2-4 puffs into the lungs every 6 (six) hours as needed for wheezing or shortness of breath., Disp: , Rfl:  .  albuterol (PROVENTIL) (2.5 MG/3ML) 0.083% nebulizer solution, Take 2.5 mg by nebulization every 6 (six) hours as needed for wheezing or shortness of breath., Disp: , Rfl:  .  ALPRAZolam (XANAX) 1 MG tablet, Take 1 mg by mouth at bedtime., Disp: , Rfl:  .  aspirin EC 81 MG tablet, Take 81 mg by mouth daily., Disp: , Rfl:  .  Cyanocobalamin (B-12 SL), Place 1 tablet under the tongue daily., Disp: , Rfl:  .  doxycycline (VIBRA-TABS) 100 MG tablet, Take 100 mg by mouth daily., Disp: , Rfl:  .  fluticasone-salmeterol (ADVAIR HFA) 230-21 MCG/ACT inhaler, Inhale 2 puffs into the lungs 2 (two) times daily., Disp: 1 Inhaler, Rfl: 5 .  loratadine-pseudoephedrine (CLARITIN-D 12-HOUR) 5-120 MG tablet, Take 1 tablet by mouth 2 (two) times daily., Disp: , Rfl:  .  losartan (COZAAR) 50 MG tablet, Take 50 mg by mouth daily., Disp: , Rfl:  .  Multiple Vitamins-Minerals (VISION VITAMINS PO), Take 1 tablet by mouth 2 (two) times daily., Disp: , Rfl:  .  Naproxen Sodium (ALEVE PO), Take 1 tablet by mouth 2 (two) times daily as needed (pain)., Disp: , Rfl:  .  pantoprazole (PROTONIX) 40 MG tablet, Take 40 mg by mouth daily., Disp: , Rfl:  .   tiotropium (SPIRIVA) 18 MCG inhalation capsule, Place 18 mcg into inhaler and inhale daily., Disp: , Rfl:  .  vitamin E 400 UNIT capsule, Take 400 Units by mouth daily., Disp: , Rfl:   Past Medical History: Past Medical History:  Diagnosis Date  . Actinic keratosis   . Asthma   . COPD (chronic obstructive pulmonary disease) ([place])   . Hypertension   . Insomnia   . Seasonal allergies   . Tobacco abuse     Tobacco Use: History  Smoking Status  . Former Smoker  . Packs/day: 1.00  . Years: 57.00  . Types: Cigarettes  . Start date: 03/27/1958  . Quit date: 12/16/2015  Smokeless Tobacco  . Never Used    Comment: currently smoking 0.5ppd    Labs: Recent Review Flowsheet Data    There is no flowsheet data to display.      Capillary Blood Glucose: No results found for: GLUCAP   ADL UCSD:     Pulmonary Assessment Scores    Row Name 03/01/16 1601         ADL UCSD   ADL Phase Entry     SOB Score total 90     Rest 3     Walk 12     Stairs 4     Bath 4     Dress 4     Shop 4       CAT Score   CAT Score 31  mMRC Score   mMRC Score 4        Pulmonary Function Assessment:   Exercise Target Goals:    Exercise Program Goal: Individual exercise prescription set with THRR, safety & activity barriers. Participant demonstrates ability to understand and report RPE using BORG scale, to self-measure pulse accurately, and to acknowledge the importance of the exercise prescription.  Exercise Prescription Goal: Starting with aerobic activity 30 plus minutes a day, 3 days per week for initial exercise prescription. Provide home exercise prescription and guidelines that participant acknowledges understanding prior to discharge.  Activity Barriers & Risk Stratification:     Activity Barriers & Cardiac Risk Stratification - 03/01/16 1559      Activity Barriers & Cardiac Risk Stratification   Activity Barriers Shortness of Breath   Cardiac Risk Stratification High       6 Minute Walk:     6 Minute Walk    Row Name 03/01/16 1635         6 Minute Walk   Phase Initial     Distance 1000 feet     Walk Time 6 minutes     # of Rest Breaks 0     MPH 1.89     METS 2.45     RPE 17     Perceived Dyspnea  16     VO2 Peak 7.43     Symptoms Yes (comment)     Comments Extreme SOB. Practiced pursed lip breathing until breathing was back to WNL.      Resting HR 81 bpm     Resting BP 170/80     Max Ex. HR 106 bpm     Max Ex. BP 188/84     2 Minute Post BP 140/82        Initial Exercise Prescription:     Initial Exercise Prescription - 03/06/16 1100      Date of Initial Exercise RX and Referring Provider   Date 03/01/16   Referring Provider Dr. Lake Bells     Treadmill   MPH 1   Grade 0   Minutes 15   METs 1.7     NuStep   Level 2   Watts 15   Minutes 20   METs 1.9     Prescription Details   Frequency (times per week) 2   Duration Progress to 30 minutes of continuous aerobic without signs/symptoms of physical distress     Intensity   THRR REST +  20   THRR 40-80% of Max Heartrate 107-120-132   Ratings of Perceived Exertion 11-13   Perceived Dyspnea 0-4     Progression   Progression Continue progressive overload as per policy without signs/symptoms or physical distress.     Resistance Training   Training Prescription Yes   Weight 1   Reps 10-12      Perform Capillary Blood Glucose checks as needed.  Exercise Prescription Changes:      Exercise Prescription Changes    Row Name 03/22/16 1500 04/23/16 1400 05/22/16 1500         Exercise Review   Progression Yes Yes Yes       Response to Exercise   Blood Pressure (Admit) 160/80 150/70 164/80     Blood Pressure (Exercise) 180/84 180/80 182/86     Blood Pressure (Exit) 156/74 160/70 164/80     Heart Rate (Admit) 91 bpm 90 bpm 80 bpm     Heart Rate (Exercise) 91 bpm 96 bpm 98 bpm  Heart Rate (Exit) 95 bpm 94 bpm 97 bpm     Oxygen Saturation (Admit) 94 % 94 % 92  %     Oxygen Saturation (Exercise) 97 % 97 % 95 %     Oxygen Saturation (Exit) 95 % 96 % 97 %     Rating of Perceived Exertion (Exercise) '10 11 12     '$ Perceived Dyspnea (Exercise) '13 12 12     '$ Duration Progress to 30 minutes of continuous aerobic without signs/symptoms of physical distress Progress to 30 minutes of continuous aerobic without signs/symptoms of physical distress Progress to 30 minutes of continuous aerobic without signs/symptoms of physical distress     Intensity Rest + 20 Rest + 20 Rest + 20       Progression   Progression Continue progressive overload as per policy without signs/symptoms or physical distress. Continue progressive overload as per policy without signs/symptoms or physical distress. Continue progressive overload as per policy without signs/symptoms or physical distress.       Resistance Training   Training Prescription Yes Yes Yes     Weight '2 2 2     '$ Reps 10-12 10-12 10-12       Treadmill   MPH 1.3 2 2.3     Grade 0 0 0     Minutes '20 20 20     '$ METs 1.9 2.53 2.7       NuStep   Level '2 2 3     '$ Watts 33 27 33     Minutes '20 20 20     '$ METs 3.57 3.58 3.59       Home Exercise Plan   Plans to continue exercise at [facility]     Frequency Add 2 additional days to program exercise sessions. Add 2 additional days to program exercise sessions. Add 2 additional days to program exercise sessions.        Exercise Comments:      Exercise Comments    Row Name 03/22/16 1539 04/23/16 1406 05/22/16 1504       Exercise Comments Patient is progressing appropriately  Patient is progressing well. Patient is progressing well        Discharge Exercise Prescription (Final Exercise Prescription Changes):     Exercise Prescription Changes - 05/22/16 1500      Exercise Review   Progression Yes     Response to Exercise   Blood Pressure (Admit) 164/80   Blood Pressure (Exercise) 182/86   Blood Pressure (Exit) 164/80   Heart Rate (Admit) 80 bpm   Heart  Rate (Exercise) 98 bpm   Heart Rate (Exit) 97 bpm   Oxygen Saturation (Admit) 92 %   Oxygen Saturation (Exercise) 95 %   Oxygen Saturation (Exit) 97 %   Rating of Perceived Exertion (Exercise) 12   Perceived Dyspnea (Exercise) 12   Duration Progress to 30 minutes of continuous aerobic without signs/symptoms of physical distress   Intensity Rest + 20     Progression   Progression Continue progressive overload as per policy without signs/symptoms or physical distress.     Resistance Training   Training Prescription Yes   Weight 2   Reps 10-12     Treadmill   MPH 2.3   Grade 0   Minutes 20   METs 2.7     NuStep   Level 3   Watts 33   Minutes 20   METs 3.59     Home Exercise Plan   Plans to continue  exercise at Home   Frequency Add 2 additional days to program exercise sessions.      Nutrition:  Target Goals: Understanding of nutrition guidelines, daily intake of sodium '1500mg'$ , cholesterol '200mg'$ , calories 30% from fat and 7% or less from saturated fats, daily to have 5 or more servings of fruits and vegetables.  Biometrics:     Pre Biometrics - 03/01/16 1640      Pre Biometrics   Height '5\' 8"'$  (1.727 m)   Weight 186 lb 4.8 oz (84.5 kg)   Waist Circumference 38.5 inches   Hip Circumference 40.5 inches   Waist to Hip Ratio 0.95 %   BMI (Calculated) 28.4   Triceps Skinfold 18 mm   % Body Fat 28 %   Grip Strength 66 kg   Flexibility 0 in   Single Leg Stand 2 seconds       Nutrition Therapy Plan and Nutrition Goals:   Nutrition Discharge: Rate Your Plate Scores:     Nutrition Assessments - 03/01/16 1603      Rate Your Plate Scores   Pre Score 47   Pre Score % 47 %      Psychosocial: Target Goals: Acknowledge presence or absence of depression, maximize coping skills, provide positive support system. Participant is able to verbalize types and ability to use techniques and skills needed for reducing stress and depression.  Initial Review &  Psychosocial Screening:     Initial Psych Review & Screening - 03/01/16 [place]? Yes   Concerns Recent loss of child   Comments Patient says he is not depressed and does not need counseling. He does have down days and is still grieving the loss of his son 1 year ago.      Screening Interventions   Interventions Encouraged to exercise      Quality of Life Scores:     Quality of Life - 03/01/16 1641      Quality of Life Scores   Health/Function Pre 20.81 %   Socioeconomic Pre 20.83 %   Psych/Spiritual Pre 21 %   Family Pre 21 %   GLOBAL Pre 20.88 %      PHQ-9: Recent Review Flowsheet Data    Depression screen [facility] 2/9 03/01/2016   Decreased Interest 0   Down, Depressed, Hopeless 0   PHQ - 2 Score 0   Altered sleeping 1   Tired, decreased energy 3   Change in appetite 0   Feeling bad or failure about yourself  2   Trouble concentrating 1   Moving slowly or fidgety/restless 1   Suicidal thoughts 0   PHQ-9 Score 8   Difficult doing work/chores Somewhat difficult      Psychosocial Evaluation and Intervention:     Psychosocial Evaluation - 03/01/16 1609      Psychosocial Evaluation & Interventions   Interventions Encouraged to exercise with the program and follow exercise prescription   Comments Patient is grieving the loss of his son but says he is not depressed and does not need counseling.       Psychosocial Re-Evaluation:     Psychosocial Re-Evaluation    [place] Name 03/29/16 1531 04/25/16 1421 05/24/16 0912         Psychosocial Re-Evaluation   Interventions Encouraged to attend Pulmonary Rehabilitation for the exercise Encouraged to attend Pulmonary Rehabilitation for the exercise Encouraged to attend Pulmonary Rehabilitation for the exercise     Comments Patient's QOL  score was 20.88 and his PHQ-9 score was 8 indicating some depression. He says he has some days he feels depression but feels he does not need  counseling. Continues to grieve the loss of a child. Patient continues to have no psychosocial issues.  Patient continues to grieve the loss of his son. He is coping well. He continues to have a positive outlook.      Continued Psychosocial Services Needed No No No        Education: Education Goals: Education classes will be provided on a weekly basis, covering required topics. Participant will state understanding/return demonstration of topics presented.  Learning Barriers/Preferences:     Learning Barriers/Preferences - 03/01/16 1559      Learning Barriers/Preferences   Learning Barriers None   Learning Preferences Skilled Demonstration      Education Topics: How Lungs Work and Diseases: - Discuss the anatomy of the lungs and diseases that can affect the lungs, such as COPD. Flowsheet Row PULMONARY REHAB OTHER RESPIRATORY from 05/17/2016 in [facility]  Date  05/17/16  Educator  GC  Instruction Review Code  2- meets goals/outcomes      Exercise: -Discuss the importance of exercise, FITT principles of exercise, normal and abnormal responses to exercise, and how to exercise safely.   Environmental Irritants: -Discuss types of environmental irritants and how to limit exposure to environmental irritants.   Meds/Inhalers and oxygen: - Discuss respiratory medications, definition of an inhaler and oxygen, and the proper way to use an inhaler and oxygen.   Energy Saving Techniques: - Discuss methods to conserve energy and decrease shortness of breath when performing activities of daily living.  Flowsheet Row PULMONARY REHAB OTHER RESPIRATORY from 05/17/2016 in [facility] [facility]  Date  03/15/16  Educator  Jacelyn Grip  Instruction Review Code  2- meets goals/outcomes      Bronchial Hygiene / Breathing Techniques: - Discuss breathing mechanics, pursed-lip breathing technique,  proper posture, effective ways to clear airways, and other  functional breathing techniques Flowsheet Row PULMONARY REHAB OTHER RESPIRATORY from 05/17/2016 in [place] [facility]  Date  03/22/16  Educator  Jacelyn Grip  Instruction Review Code  2- meets goals/outcomes      Cleaning Equipment: - Provides group verbal and written instruction about the health risks of elevated stress, cause of high stress, and healthy ways to reduce stress. Flowsheet Row PULMONARY REHAB OTHER RESPIRATORY from 05/17/2016 in [facility]  Date  03/22/16  Educator  Hart [place]  Instruction Review Code  2- meets goals/outcomes      Nutrition I: Fats: - Discuss the types of cholesterol, what cholesterol does to the body, and how cholesterol levels can be controlled. Flowsheet Row PULMONARY REHAB OTHER RESPIRATORY from 05/17/2016 in [place] [facility]  Date  04/05/16  Educator  Joette Catching  Instruction Review Code  2- meets goals/outcomes      Nutrition II: Labels: -Discuss the different components of food labels and how to read food labels. Flowsheet Row PULMONARY REHAB OTHER RESPIRATORY from 05/17/2016 in [facility]  Date  04/12/16  Educator  Jacelyn Grip  Instruction Review Code  2- meets goals/outcomes      Respiratory Infections: - Discuss the signs and symptoms of respiratory infections, ways to prevent respiratory infections, and the importance of seeking medical treatment when having a respiratory infection. Flowsheet Row PULMONARY REHAB OTHER RESPIRATORY from 05[facility]17/2016 in [facility]  Date  04/19/16  Educator  Eunice Blase  [place]  Instruction Review Code  2- meets goals/outcomes      Stress I: Signs and Symptoms: - Discuss the causes of stress, how stress may lead to anxiety and depression, and ways to limit stress. Flowsheet Row PULMONARY REHAB OTHER RESPIRATORY from 05/17/2016 in [place]  Date  04/26/16  Educator  DJ  Instruction Review Code   2- meets goals/outcomes      Stress II: Relaxation: -Discuss relaxation techniques to limit stress.   Oxygen for Home/Travel: - Discuss how to prepare for travel when on oxygen and proper ways to transport and store oxygen to ensure safety. Flowsheet Row PULMONARY REHAB OTHER RESPIRATORY from 05/17/2016 in [place]  Date  05/10/16  Educator  DJ  Instruction Review Code  2- meets goals/outcomes      Knowledge Questionnaire Score:     Knowledge Questionnaire Score - 03/01/16 1600      Knowledge Questionnaire Score   Pre Score 9/14      Core Components/Risk Factors/Patient Goals at Admission:     Personal Goals and Risk Factors at Admission - 03/01/16 1604      Core Components/Risk Factors/Patient Goals on Admission    Weight Management Obesity   Intervention Weight Management: Develop a combined nutrition and exercise program designed to reach desired caloric intake, while maintaining appropriate intake of nutrient and fiber, sodium and fats, and appropriate energy expenditure required for the weight goal.   Sedentary Yes   Intervention Provide advice, education, support and counseling about physical activity/exercise needs.;Develop an individualized exercise prescription for aerobic and resistive training based on initial evaluation findings, risk stratification, comorbidities and participant's personal goals.   Expected Outcomes Achievement of increased cardiorespiratory fitness and enhanced flexibility, muscular endurance and strength shown through measurements of functional capacity and personal statement of participant.   Increase Strength and Stamina Yes   Intervention Provide advice, education, support and counseling about physical activity/exercise needs.;Develop an individualized exercise prescription for aerobic and resistive training based on initial evaluation findings, risk stratification, comorbidities and participant's personal goals.    Expected Outcomes Achievement of increased cardiorespiratory fitness and enhanced flexibility, muscular endurance and strength shown through measurements of functional capacity and personal statement of participant.   Improve shortness of breath with ADL's Yes   Intervention Provide education, individualized exercise plan and daily activity instruction to help decrease symptoms of SOB with activities of daily living.   Expected Outcomes Short Term: Achieves a reduction of symptoms when performing activities of daily living.   Develop more efficient breathing techniques such as purse lipped breathing and diaphragmatic breathing; and practicing self-pacing with activity Yes   Intervention Provide education, demonstration and support about specific breathing techniuqes utilized for more efficient breathing. Include techniques such as pursed lipped breathing, diaphragmatic breathing and self-pacing activity.   Expected Outcomes Short Term: Participant will be able to demonstrate and use breathing techniques as needed throughout daily activities.   Stress Yes   Intervention Offer individual and/or small group education and counseling on adjustment to heart disease, stress management and health-related lifestyle change. Teach and support self-help strategies.   Expected Outcomes Long Term: Emotional wellbeing is indicated by absence of clinically significant psychosocial distress or social isolation.   Personal Goal Be able to do more ADL's without SOB, Breathe better overall. Be able to do the things he used to do-yard work; fishing; hunting.    Intervention Patient will attend pulmonary rehab 2 days/week and supplement with exercise at home 3 days/week.  Expected Outcomes Patient will meet his personal goals.       Core Components/Risk Factors/Patient Goals Review:      Goals and Risk Factor Review    Row Name 03/01/16 1607 03/29/16 1529 04/25/16 1417 05/24/16 0909       Core Components/Risk  Factors/Patient Goals Review   Personal Goals Review Weight Management/Obesity;Sedentary;Increase Strength and Stamina;Improve shortness of breath with ADL's;Develop more efficient breathing techniques such as purse lipped breathing and diaphragmatic breathing and practicing self-pacing with activity.;Stress Weight Management/Obesity;Increase Strength and Stamina;Develop more efficient breathing techniques such as purse lipped breathing and diaphragmatic breathing and practicing self-pacing with activity.;Improve shortness of breath with ADL's;Increase knowledge of respiratory medications and ability to use respiratory devices properly.;Stress  Be able to do more ADL's without SOB, Breathe better overall. Be able to do the things he used to do-yard work; fishing; hunting.  Sedentary;Increase Strength and Stamina;Develop more efficient breathing techniques such as purse lipped breathing and diaphragmatic breathing and practicing self-pacing with activity.;Improve shortness of breath with ADL's;Weight Management/Obesity;Stress  Be able to do more ADL's without SOB, Breathe better overall. Be able to do the things he used to do-yard work; fishing; hunting.  Weight Management/Obesity;Increase Strength and Stamina;Improve shortness of breath with ADL's;Develop more efficient breathing techniques such as purse lipped breathing and diaphragmatic breathing and practicing self-pacing with activity.;Stress  Be able to do more ADL's; Breathe better    Review  - Patient has attended 7 sessions. He is progressing with some increase in his strength and stamina. He has maintained his weight. Will continue to monitor for progress. Patient has attended 13 sessions gaining 1.7 lbs since starting the program. Patient is progressing well in the program with increased strength and stamina and improvement in his SOB.  Patient has attended 20 sessions. He has gained 3.7 lbs. He is progressing well in the program with improved SOB and  increased strength and stamina. He is abe to do more at home. He exercises on his treadmill at home.    Expected Outcomes  - Patient will continue to attend sessions meeting her personal goals.  Patient will continue to attend sessions completing the program meeting his personal goals.  Patient will complete the program meeting his personal goals.        Core Components/Risk Factors/Patient Goals at Discharge (Final Review):      Goals and Risk Factor Review - 05/24/16 0909      Core Components/Risk Factors/Patient Goals Review   Personal Goals Review Weight Management/Obesity;Increase Strength and Stamina;Improve shortness of breath with ADL's;Develop more efficient breathing techniques such as purse lipped breathing and diaphragmatic breathing and practicing self-pacing with activity.;Stress  Be able to do more ADL's; Breathe better   Review Patient has attended 20 sessions. He has gained 3.7 lbs. He is progressing well in the program with improved SOB and increased strength and stamina. He is abe to do more at home. He exercises on his treadmill at home.   Expected Outcomes Patient will complete the program meeting his personal goals.       ITP Comments:     ITP Comments    Row Name 03/22/16 1254           ITP Comments Patient met with Registered Dietitian to discuss nutrition topics including: Heart healthty eating, heart health cooking and make smart choices when shopping; Portion control; weight management; and hydration. Patient attended a group session with the hospital chaplian called [facility] to discuss and share how his recent pulmomary  diagnosis has effected his life.          Comments: ITP 30 Day REVIEW Pt is making expected progress toward pulmonary rehab goals after completing 20 sessions. Recommend continued exercise, life style modification, education, and utilization of breathing techniques to increase stamina and strength and decrease shortness of breath with  exertion.

## 2016-05-24 NOTE — Progress Notes (Signed)
Daily Session Note  Patient Details  Name: Dominic Dominic MRN: [id number] Date of Birth: 11/25/1940 Referring Provider:   Flowsheet Row PULMONARY REHAB OTHER RESPIRATORY from 03/06/2016 in [place]  Referring Provider  Dr. Lake Bells      Encounter Date: 05/24/2016  Check In:     Session Check In - 05/24/16 1330      Check-In   Location [facility]   Staff Present Suzanne Boron, BS, EP, Exercise Physiologist   Supervising physician immediately available to respond to emergencies See telemetry face sheet for immediately available MD   Medication changes reported     No   Fall or balance concerns reported    No   Warm-up and Cool-down Performed as group-led instruction   Resistance Training Performed Yes   VAD Patient? No     Pain Assessment   Currently in Pain? No/denies   Pain Score 0-No pain   Multiple Pain Sites No      Capillary Blood Glucose: No results found for this or any previous visit (from the past 24 hour(s)).   Goals Met:  Independence with exercise equipment Improved SOB with ADL's Using PLB without cueing & demonstrates good technique Exercise tolerated well No report of cardiac concerns or symptoms Strength training completed today  Goals Unmet:  Not Applicable  Comments: Check out 230   Dr. Sinda Du is Medical Director for [facility].

## 2016-05-25 DIAGNOSIS — J449 Chronic obstructive pulmonary disease, unspecified: Secondary | ICD-10-CM | POA: Diagnosis not present

## 2016-05-25 DIAGNOSIS — I10 Essential (primary) hypertension: Secondary | ICD-10-CM | POA: Diagnosis not present

## 2016-05-29 ENCOUNTER — Encounter (HOSPITAL_COMMUNITY)
Admission: RE | Admit: 2016-05-29 | Discharge: 2016-05-29 | Disposition: A | Discharge status: Home / Self Care | Payer: Medicare Other | Source: Ambulatory Visit | Attending: Pulmonary Disease | Admitting: Pulmonary Disease

## 2016-05-29 DIAGNOSIS — J432 Centrilobular emphysema: Secondary | ICD-10-CM

## 2016-05-29 NOTE — Progress Notes (Signed)
Daily Session Note  Patient Details  Name: Dominic Willis MRN: [id number] Date of Birth: 11-11-1940 Referring Provider:   Flowsheet Row PULMONARY REHAB OTHER RESPIRATORY from 03/06/2016 in [place]  Referring Provider  Dr. Lake Bells      Encounter Date: 05/29/2016  Check In:     Session Check In - 05/29/16 1330      Check-In   Location [facility]   Staff Present Diane Angelina Pih, MS, EP, [facility], Exercise Physiologist;Ladarryl Wrage Luther Parody, BS, EP, Exercise Physiologist   Supervising physician immediately available to respond to emergencies See telemetry face sheet for immediately available MD   Medication changes reported     No   Fall or balance concerns reported    No   Warm-up and Cool-down Performed as group-led instruction   Resistance Training Performed Yes   VAD Patient? No     Pain Assessment   Currently in Pain? No/denies   Pain Score 0-No pain   Multiple Pain Sites No      Capillary Blood Glucose: No results found for this or any previous visit (from the past 24 hour(s)).   Goals Met:  Independence with exercise equipment Improved SOB with ADL's Using PLB without cueing & demonstrates good technique Exercise tolerated well No report of cardiac concerns or symptoms Strength training completed today  Goals Unmet:  Not Applicable  Comments: Check out 230   Dr. Sinda Du is Medical Director for [facility].

## 2016-05-31 ENCOUNTER — Encounter (HOSPITAL_COMMUNITY)
Admission: RE | Admit: 2016-05-31 | Discharge: 2016-05-31 | Disposition: A | Discharge status: Home / Self Care | Payer: Medicare Other | Source: Ambulatory Visit | Attending: Pulmonary Disease | Admitting: Pulmonary Disease

## 2016-05-31 VITALS — Ht 68.0 in | Wt 186.7 lb

## 2016-05-31 DIAGNOSIS — J432 Centrilobular emphysema: Secondary | ICD-10-CM | POA: Diagnosis not present

## 2016-05-31 NOTE — Progress Notes (Signed)
Daily Session Note  Patient Details  Name: Dominic Willis MRN: [id number] Date of Birth: 1940-11-19 Referring Provider:   Flowsheet Row PULMONARY REHAB OTHER RESPIRATORY from 03/06/2016 in [place]  Referring Provider  Dr. Lake Bells      Encounter Date: 05/31/2016  Check In:     Session Check In - 05/31/16 1328      Check-In   Location [facility]   Staff Present Aundra Dubin, RN, BSN;Fabyan Loughmiller Luther Parody, BS, EP, Exercise Physiologist   Supervising physician immediately available to respond to emergencies See telemetry face sheet for immediately available MD   Medication changes reported     No   Fall or balance concerns reported    No   Warm-up and Cool-down Performed as group-led instruction   Resistance Training Performed Yes   VAD Patient? No     Pain Assessment   Currently in Pain? No/denies   Pain Score 0-No pain   Multiple Pain Sites No      Capillary Blood Glucose: No results found for this or any previous visit (from the past 24 hour(s)).   Goals Met:  Independence with exercise equipment Improved SOB with ADL's Using PLB without cueing & demonstrates good technique Exercise tolerated well No report of cardiac concerns or symptoms Strength training completed today  Goals Unmet:  Not Applicable  Comments: Check out 230   Dr. Sinda Du is Medical Director for [facility].

## 2016-06-01 NOTE — Progress Notes (Signed)
Pulmonary Individual Treatment Plan  Patient Details  Name: Dominic Willis MRN: [id number] Date of Birth: 1940-12-27 Referring Provider:   Flowsheet Row PULMONARY REHAB OTHER RESPIRATORY from 03/06/2016 in [facility]  Referring Provider  Dr. Lake Bells      Initial Encounter Date:  Flowsheet Row PULMONARY REHAB OTHER RESPIRATORY from 03/06/2016 in [facility] [facility]  Date  03/01/16  Referring Provider  Dr. Lake Bells      Visit Diagnosis: Centrilobular emphysema ([place])  Patient's Home Medications on Admission:   Current Outpatient Prescriptions:  .  albuterol (PROVENTIL HFA;VENTOLIN HFA) 108 (90 Base) MCG/ACT inhaler, Inhale 2-4 puffs into the lungs every 6 (six) hours as needed for wheezing or shortness of breath., Disp: , Rfl:  .  albuterol (PROVENTIL) (2.5 MG/3ML) 0.083% nebulizer solution, Take 2.5 mg by nebulization every 6 (six) hours as needed for wheezing or shortness of breath., Disp: , Rfl:  .  ALPRAZolam (XANAX) 1 MG tablet, Take 1 mg by mouth at bedtime., Disp: , Rfl:  .  aspirin EC 81 MG tablet, Take 81 mg by mouth daily., Disp: , Rfl:  .  Cyanocobalamin (B-12 SL), Place 1 tablet under the tongue daily., Disp: , Rfl:  .  doxycycline (VIBRA-TABS) 100 MG tablet, Take 100 mg by mouth daily., Disp: , Rfl:  .  fluticasone-salmeterol (ADVAIR HFA) 230-21 MCG/ACT inhaler, Inhale 2 puffs into the lungs 2 (two) times daily., Disp: 1 Inhaler, Rfl: 5 .  loratadine-pseudoephedrine (CLARITIN-D 12-HOUR) 5-120 MG tablet, Take 1 tablet by mouth 2 (two) times daily., Disp: , Rfl:  .  losartan (COZAAR) 50 MG tablet, Take 50 mg by mouth daily., Disp: , Rfl:  .  Multiple Vitamins-Minerals (VISION VITAMINS PO), Take 1 tablet by mouth 2 (two) times daily., Disp: , Rfl:  .  Naproxen Sodium (ALEVE PO), Take 1 tablet by mouth 2 (two) times daily as needed (pain)., Disp: , Rfl:  .  pantoprazole (PROTONIX) 40 MG tablet, Take 40 mg by mouth daily., Disp: , Rfl:  .   tiotropium (SPIRIVA) 18 MCG inhalation capsule, Place 18 mcg into inhaler and inhale daily., Disp: , Rfl:  .  vitamin E 400 UNIT capsule, Take 400 Units by mouth daily., Disp: , Rfl:   Past Medical History: Past Medical History:  Diagnosis Date  . Actinic keratosis   . Asthma   . COPD (chronic obstructive pulmonary disease) ([place])   . Hypertension   . Insomnia   . Seasonal allergies   . Tobacco abuse     Tobacco Use: History  Smoking Status  . Former Smoker  . Packs/day: 1.00  . Years: 57.00  . Types: Cigarettes  . Start date: 03/27/1958  . Quit date: 12/16/2015  Smokeless Tobacco  . Never Used    Comment: currently smoking 0.5ppd    Labs: Recent Review Flowsheet Data    There is no flowsheet data to display.      Capillary Blood Glucose: No results found for: GLUCAP   ADL UCSD:     Pulmonary Assessment Scores    Row Name 03/01/16 1601 05/31/16 1515       ADL UCSD   ADL Phase Entry Exit    SOB Score total 90 70    Rest 3 2    Walk 12 10    Stairs 4 4    Bath 4 3    Dress 4 2    Shop 4 3      CAT Score   CAT Score 31  30      mMRC Score   mMRC Score 4 3       Pulmonary Function Assessment:   Exercise Target Goals:    Exercise Program Goal: Individual exercise prescription set with THRR, safety & activity barriers. Participant demonstrates ability to understand and report RPE using BORG scale, to self-measure pulse accurately, and to acknowledge the importance of the exercise prescription.  Exercise Prescription Goal: Starting with aerobic activity 30 plus minutes a day, 3 days per week for initial exercise prescription. Provide home exercise prescription and guidelines that participant acknowledges understanding prior to discharge.  Activity Barriers & Risk Stratification:     Activity Barriers & Cardiac Risk Stratification - 03/01/16 1559      Activity Barriers & Cardiac Risk Stratification   Activity Barriers Shortness of Breath    Cardiac Risk Stratification High      6 Minute Walk:     6 Minute Walk    Row Name 03/01/16 1635 06/01/16 1410       6 Minute Walk   Phase Initial Discharge    Distance 1000 feet 1350 feet    Distance % Change  - 35 %    Walk Time 6 minutes 6 minutes    # of Rest Breaks 0 0    MPH 1.89 2.55    METS 2.45 2.95    RPE 17 14    Perceived Dyspnea  16 14    VO2 Peak 7.43 12.8    Symptoms Yes (comment) No    Comments Extreme SOB. Practiced pursed lip breathing until breathing was back to WNL.   -    Resting HR 81 bpm 83 bpm    Resting BP 170/80 158/78    Max Ex. HR 106 bpm 125 bpm    Max Ex. BP 188/84 210/94    2 Minute Post BP 140/82 170/84       Initial Exercise Prescription:     Initial Exercise Prescription - 03/06/16 1100      Date of Initial Exercise RX and Referring Provider   Date 03/01/16   Referring Provider Dr. Lake Bells     Treadmill   MPH 1   Grade 0   Minutes 15   METs 1.7     NuStep   Level 2   Watts 15   Minutes 20   METs 1.9     Prescription Details   Frequency (times per week) 2   Duration Progress to 30 minutes of continuous aerobic without signs/symptoms of physical distress     Intensity   THRR REST +  20   THRR 40-80% of Max Heartrate 107-120-132   Ratings of Perceived Exertion 11-13   Perceived Dyspnea 0-4     Progression   Progression Continue progressive overload as per policy without signs/symptoms or physical distress.     Resistance Training   Training Prescription Yes   Weight 1   Reps 10-12      Perform Capillary Blood Glucose checks as needed.  Exercise Prescription Changes:      Exercise Prescription Changes    Row Name 03/22/16 1500 04/23/16 1400 05/22/16 1500         Exercise Review   Progression Yes Yes Yes       Response to Exercise   Blood Pressure (Admit) 160/80 150/70 164/80     Blood Pressure (Exercise) 180/84 180/80 182/86     Blood Pressure (Exit) 156/74 160/70 164/80     Heart Rate (Admit) 91  bpm 90 bpm 80 bpm     Heart Rate (Exercise) 91 bpm 96 bpm 98 bpm     Heart Rate (Exit) 95 bpm 94 bpm 97 bpm     Oxygen Saturation (Admit) 94 % 94 % 92 %     Oxygen Saturation (Exercise) 97 % 97 % 95 %     Oxygen Saturation (Exit) 95 % 96 % 97 %     Rating of Perceived Exertion (Exercise) _0 Perceived Dyspnea (Exercise) _1 Duration Progress to 30 minutes of continuous aerobic without signs/symptoms of physical distress Progress to 30 minutes of continuous aerobic without signs/symptoms of physical distress Progress to 30 minutes of continuous aerobic without signs/symptoms of physical distress     Intensity Rest + 20 Rest + 20 Rest + 20       Progression   Progression Continue progressive overload as per policy without signs/symptoms or physical distress. Continue progressive overload as per policy without signs/symptoms or physical distress. Continue progressive overload as per policy without signs/symptoms or physical distress.       Resistance Training   Training Prescription Yes Yes Yes     Weight _2 Reps 10-12 10-12 10-12       Treadmill   MPH 1.3 2 2.3     Grade 0 0 0     Minutes _3 METs 1.9 2.53 2.7       NuStep   Level _4 Watts 33 27 33     Minutes _5 METs 3.57 3.58 3.59       Home Exercise Plan   Plans to continue exercise at [place]     Frequency Add 2 additional days to program exercise sessions. Add 2 additional days to program exercise sessions. Add 2 additional days to program exercise sessions.        Exercise Comments:      Exercise Comments    Row Name 03/22/16 1539 04/23/16 1406 05/22/16 1504       Exercise Comments Patient is progressing appropriately  Patient is progressing well. Patient is progressing well        Discharge Exercise Prescription (Final Exercise Prescription Changes):     Exercise Prescription Changes - 05/22/16 1500      Exercise Review   Progression Yes      Response to Exercise   Blood Pressure (Admit) 164/80   Blood Pressure (Exercise) 182/86   Blood Pressure (Exit) 164/80   Heart Rate (Admit) 80 bpm   Heart Rate (Exercise) 98 bpm   Heart Rate (Exit) 97 bpm   Oxygen Saturation (Admit) 92 %   Oxygen Saturation (Exercise) 95 %   Oxygen Saturation (Exit) 97 %   Rating of Perceived Exertion (Exercise) 12   Perceived Dyspnea (Exercise) 12   Duration Progress to 30 minutes of continuous aerobic without signs/symptoms of physical distress   Intensity Rest + 20     Progression   Progression Continue progressive overload as per policy without signs/symptoms or physical distress.     Resistance Training   Training Prescription Yes   Weight 2   Reps 10-12     Treadmill   MPH 2.3   Grade 0   Minutes 20   METs 2.7     NuStep   Level 3   Watts  33   Minutes 20   METs 3.59     Home Exercise Plan   Plans to continue exercise at Home   Frequency Add 2 additional days to program exercise sessions.       Nutrition:  Target Goals: Understanding of nutrition guidelines, daily intake of sodium <1565m, cholesterol <2015m calories 30% from fat and 7% or less from saturated fats, daily to have 5 or more servings of fruits and vegetables.  Biometrics:     Pre Biometrics - 03/01/16 1640      Pre Biometrics   Height _0  (1.727 m)   Weight 186 lb 4.8 oz (84.5 kg)   Waist Circumference 38.5 inches   Hip Circumference 40.5 inches   Waist to Hip Ratio 0.95 %   BMI (Calculated) 28.4   Triceps Skinfold 18 mm   % Body Fat 28 %   Grip Strength 66 kg   Flexibility 0 in   Single Leg Stand 2 seconds         Post Biometrics - 06/01/16 1411       Post  Biometrics   Height _1  (1.727 m)   Weight 186 lb 11 oz (84.7 kg)   Waist Circumference 40 inches   Hip Circumference 40.5 inches   Waist to Hip Ratio 0.99 %   BMI (Calculated) 28.4   Triceps Skinfold 18 mm   % Body Fat 28.8 %   Grip Strength 65 kg   Flexibility 0 in   Single  Leg Stand 2 seconds      Nutrition Therapy Plan and Nutrition Goals:   Nutrition Discharge: Rate Your Plate Scores:     Nutrition Assessments - 05/31/16 1516      Rate Your Plate Scores   Pre Score 47   Pre Score % 47 %   Post Score 47   Post Score % 47 %   % Change 0 %      Psychosocial: Target Goals: Acknowledge presence or absence of depression, maximize coping skills, provide positive support system. Participant is able to verbalize types and ability to use techniques and skills needed for reducing stress and depression.  Initial Review & Psychosocial Screening:     Initial Psych Review & Screening - 03/01/16 [place]   Concerns Recent loss of child   Comments Patient says he is not depressed and does not need counseling. He does have down days and is still grieving the loss of his son 1 year ago.      Screening Interventions   Interventions Encouraged to exercise      Quality of Life Scores:     Quality of Life - 06/01/16 1412      Quality of Life Scores   Health/Function Pre 20.81 %   Health/Function Post 16.3 %   Health/Function % Change -21.67 %   Socioeconomic Pre 20.83 %   Socioeconomic Post 28.93 %   Socioeconomic % Change  38.89 %   Psych/Spiritual Pre 21 %   Psych/Spiritual Post 27.43 %   Psych/Spiritual % Change 30.62 %   Family Pre 21 %   Family Post 30 %   Family % Change 42.86 %   GLOBAL Pre 20.88 %   GLOBAL Post 23.21 %   GLOBAL % Change 11.16 %      PHQ-9: Recent Review Flowsheet Data    [facility]2016 03/01/2016   Decreased Interest 0  0   Down, Depressed, Hopeless 0 0   PHQ - 2 Score 0 0   Altered sleeping 2 1   Tired, decreased energy 3 3   Change in appetite 1 0   Feeling bad or failure about yourself  0 2   Trouble concentrating 0 1   Moving slowly or fidgety/restless 1 1   Suicidal thoughts 0 0   PHQ-9 Score 7 8   Difficult doing work/chores Somewhat  difficult Somewhat difficult      Psychosocial Evaluation and Intervention:     Psychosocial Evaluation - 06/01/16 1426      Discharge Psychosocial Assessment & Intervention   Discharge Continue support measures as needed   Comments Patient QOL score improved and his PHQ-9 score also improved some. Patient continues to deal with the grief of losing his son but has good support and coping skills.       Psychosocial Re-Evaluation:     Psychosocial Re-Evaluation    [place] Name 03/29/16 1531 04/25/16 1421 05/24/16 0912         Psychosocial Re-Evaluation   Interventions Encouraged to attend Pulmonary Rehabilitation for the exercise Encouraged to attend Pulmonary Rehabilitation for the exercise Encouraged to attend Pulmonary Rehabilitation for the exercise     Comments Patient's QOL score was 20.88 and his PHQ-9 score was 8 indicating some depression. He says he has some days he feels depression but feels he does not need counseling. Continues to grieve the loss of a child. Patient continues to have no psychosocial issues.  Patient continues to grieve the loss of his son. He is coping well. He continues to have a positive outlook.      Continued Psychosocial Services Needed No No No       Education: Education Goals: Education classes will be provided on a weekly basis, covering required topics. Participant will state understanding/return demonstration of topics presented.  Learning Barriers/Preferences:     Learning Barriers/Preferences - 03/01/16 1559      Learning Barriers/Preferences   Learning Barriers None   Learning Preferences Skilled Demonstration      Education Topics: Risk Factor Reduction:  -Group instruction that is supported by a PowerPoint presentation. Instructor discusses the definition of a risk factor, different risk factors for pulmonary disease, and how the heart and lungs work together.     Nutrition for Pulmonary Patient:  -Group instruction provided by  PowerPoint slides, verbal discussion, and written materials to support subject matter. The instructor gives an explanation and review of healthy diet recommendations, which includes a discussion on weight management, recommendations for fruit and vegetable consumption, as well as protein, fluid, caffeine, fiber, sodium, sugar, and alcohol. Tips for eating when patients are short of breath are discussed.   Pursed Lip Breathing:  -Group instruction that is supported by demonstration and informational handouts. Instructor discusses the benefits of pursed lip and diaphragmatic breathing and detailed demonstration on how to preform both.     Oxygen Safety:  -Group instruction provided by PowerPoint, verbal discussion, and written material to support subject matter. There is an overview of "What is Oxygen" and "Why do we need it".  Instructor also reviews how to create a safe environment for oxygen use, the importance of using oxygen as prescribed, and the risks of noncompliance. There is a brief discussion on traveling with oxygen and resources the patient may utilize.   Oxygen Equipment:  -Group instruction provided by [facility] Staff utilizing handouts, written materials, and equipment demonstrations.   Signs and  Symptoms:  -Group instruction provided by written material and verbal discussion to support subject matter. Warning signs and symptoms of infection, stroke, and heart attack are reviewed and when to call the physician/911 reinforced. Tips for preventing the spread of infection discussed.   Advanced Directives:  -Group instruction provided by verbal instruction and written material to support subject matter. Instructor reviews Advanced Directive laws and proper instruction for filling out document.   Pulmonary Video:  -Group video education that reviews the importance of medication and oxygen compliance, exercise, good nutrition, pulmonary hygiene, and pursed lip and diaphragmatic  breathing for the pulmonary patient.   Exercise for the Pulmonary Patient:  -Group instruction that is supported by a PowerPoint presentation. Instructor discusses benefits of exercise, core components of exercise, frequency, duration, and intensity of an exercise routine, importance of utilizing pulse oximetry during exercise, safety while exercising, and options of places to exercise outside of rehab.     Pulmonary Medications:  -Verbally interactive group education provided by instructor with focus on inhaled medications and proper administration.   Anatomy and Physiology of the Respiratory System and Intimacy:  -Group instruction provided by PowerPoint, verbal discussion, and written material to support subject matter. Instructor reviews respiratory cycle and anatomical components of the respiratory system and their functions. Instructor also reviews differences in obstructive and restrictive respiratory diseases with examples of each. Intimacy, Sex, and Sexuality differences are reviewed with a discussion on how relationships can change when diagnosed with pulmonary disease. Common sexual concerns are reviewed.   Knowledge Questionnaire Score:     Knowledge Questionnaire Score - 05/31/16 1512      Knowledge Questionnaire Score   Pre Score 9/14   Post Score 11/14      Core Components/Risk Factors/Patient Goals at Admission:     Personal Goals and Risk Factors at Admission - 03/01/16 1604      Core Components/Risk Factors/Patient Goals on Admission    Weight Management Obesity   Intervention Weight Management: Develop a combined nutrition and exercise program designed to reach desired caloric intake, while maintaining appropriate intake of nutrient and fiber, sodium and fats, and appropriate energy expenditure required for the weight goal.   Sedentary Yes   Intervention Provide advice, education, support and counseling about physical activity/exercise needs.;Develop an  individualized exercise prescription for aerobic and resistive training based on initial evaluation findings, risk stratification, comorbidities and participant's personal goals.   Expected Outcomes Achievement of increased cardiorespiratory fitness and enhanced flexibility, muscular endurance and strength shown through measurements of functional capacity and personal statement of participant.   Increase Strength and Stamina Yes   Intervention Provide advice, education, support and counseling about physical activity/exercise needs.;Develop an individualized exercise prescription for aerobic and resistive training based on initial evaluation findings, risk stratification, comorbidities and participant's personal goals.   Expected Outcomes Achievement of increased cardiorespiratory fitness and enhanced flexibility, muscular endurance and strength shown through measurements of functional capacity and personal statement of participant.   Improve shortness of breath with ADL's Yes   Intervention Provide education, individualized exercise plan and daily activity instruction to help decrease symptoms of SOB with activities of daily living.   Expected Outcomes Short Term: Achieves a reduction of symptoms when performing activities of daily living.   Develop more efficient breathing techniques such as purse lipped breathing and diaphragmatic breathing; and practicing self-pacing with activity Yes   Intervention Provide education, demonstration and support about specific breathing techniuqes utilized for more efficient breathing. Include techniques such as pursed lipped breathing,  diaphragmatic breathing and self-pacing activity.   Expected Outcomes Short Term: Participant will be able to demonstrate and use breathing techniques as needed throughout daily activities.   Stress Yes   Intervention Offer individual and/or small group education and counseling on adjustment to heart disease, stress management and  health-related lifestyle change. Teach and support self-help strategies.   Expected Outcomes Long Term: Emotional wellbeing is indicated by absence of clinically significant psychosocial distress or social isolation.   Personal Goal Be able to do more ADL's without SOB, Breathe better overall. Be able to do the things he used to do-yard work; fishing; hunting.    Intervention Patient will attend pulmonary rehab 2 days/week and supplement with exercise at home 3 days/week.    Expected Outcomes Patient will meet his personal goals.       Core Components/Risk Factors/Patient Goals Review:      Goals and Risk Factor Review    Row Name 03/01/16 1607 03/29/16 1529 04/25/16 1417 05/24/16 0909 05/31/16 1518     Core Components/Risk Factors/Patient Goals Review   Personal Goals Review Weight Management/Obesity;Sedentary;Increase Strength and Stamina;Improve shortness of breath with ADL's;Develop more efficient breathing techniques such as purse lipped breathing and diaphragmatic breathing and practicing self-pacing with activity.;Stress Weight Management/Obesity;Increase Strength and Stamina;Develop more efficient breathing techniques such as purse lipped breathing and diaphragmatic breathing and practicing self-pacing with activity.;Improve shortness of breath with ADL's;Increase knowledge of respiratory medications and ability to use respiratory devices properly.;Stress  Be able to do more ADL's without SOB, Breathe better overall. Be able to do the things he used to do-yard work; fishing; hunting.  Sedentary;Increase Strength and Stamina;Develop more efficient breathing techniques such as purse lipped breathing and diaphragmatic breathing and practicing self-pacing with activity.;Improve shortness of breath with ADL's;Weight Management/Obesity;Stress  Be able to do more ADL's without SOB, Breathe better overall. Be able to do the things he used to do-yard work; fishing; hunting.  Weight  Management/Obesity;Increase Strength and Stamina;Improve shortness of breath with ADL's;Develop more efficient breathing techniques such as purse lipped breathing and diaphragmatic breathing and practicing self-pacing with activity.;Stress  Be able to do more ADL's; Breathe better Weight Management/Obesity;Increase Strength and Stamina;Develop more efficient breathing techniques such as purse lipped breathing and diaphragmatic breathing and practicing self-pacing with activity.;Improve shortness of breath with ADL's;Increase knowledge of respiratory medications and ability to use respiratory devices properly.  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    Review  - Patient has attended 7 sessions. He is progressing with some increase in his strength and stamina. He has maintained his weight. Will continue to monitor for progress. Patient has attended 13 sessions gaining 1.7 lbs since starting the program. Patient is progressing well in the program with increased strength and stamina and improvement in his SOB.  Patient has attended 20 sessions. He has gained 3.7 lbs. He is progressing well in the program with improved SOB and increased strength and stamina. He is abe to do more at home. He exercises on his treadmill at home. Upon graduation, patient gained 5.7 lbs. He did well in the program with improved SOB and increased strength and stamina. He is able to do more ADL's with less SOB. He has not resumed fishing, hunting and yardwork d/t the weather but he says he feels strong enough to do those things. He plans to continue exercising on his equipment at home.    Expected Outcomes  -  Patient will continue to attend sessions meeting her personal goals.  Patient will continue to attend sessions completing the program meeting his personal goals.  Patient will complete the program meeting his personal goals.  Patient will continue to exercise  at home with continued improvement in his SOB, strength and stamina. He will continue to work toward a heart healthy diet and start to meet his weight loss goal.       Core Components/Risk Factors/Patient Goals at Discharge (Final Review):      Goals and Risk Factor Review - 05/31/16 1518      Core Components/Risk Factors/Patient Goals Review   Personal Goals Review Weight Management/Obesity;Increase Strength and Stamina;Develop more efficient breathing techniques such as purse lipped breathing and diaphragmatic breathing and practicing self-pacing with activity.;Improve shortness of breath with ADL's;Increase knowledge of respiratory medications and ability to use respiratory devices properly.  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    Review Upon graduation, patient gained 5.7 lbs. He did well in the program with improved SOB and increased strength and stamina. He is able to do more ADL's with less SOB. He has not resumed fishing, hunting and yardwork d/t the weather but he says he feels strong enough to do those things. He plans to continue exercising on his equipment at home.    Expected Outcomes Patient will continue to exercise at home with continued improvement in his SOB, strength and stamina. He will continue to work toward a heart healthy diet and start to meet his weight loss goal.       ITP Comments:     ITP Comments    Row Name 03/22/16 1254           ITP Comments Patient met with Registered Dietitian to discuss nutrition topics including: Heart healthty eating, heart health cooking and make smart choices when shopping; Portion control; weight management; and hydration. Patient attended a group session with the hospital chaplian called [facility] to discuss and share how his recent pulmomary diagnosis has effected his life.          Comments: Patient graduated from Pulmonary Rehabilitation today  on 05/31/16 after completing 24 sessions. He achieved LTG of 30 minutes of aerobic exercise at Max Met level of 2.6.  All patients vitals are WNL. Patient has met with dietician. Discharge instruction has been reviewed in detail and patient stated an understanding of material given. Patient plans to continue exercising at home on his Treadmill. He is also considering joining our maintenance program. Cardiac Rehab staff will make f/u calls at 1 month, 6 months, and 1 year. Patient had no complaints of any abnormal S/S or pain on their exit visit.

## 2016-06-01 NOTE — Progress Notes (Signed)
Discharge Summary  Patient Details  Name: Dominic Willis MRN: [id number] Date of Birth: 1940/07/12 Referring Provider:   Flowsheet Row PULMONARY REHAB OTHER RESPIRATORY from 03/06/2016 in [place]  Referring Provider  Dr. Lake Bells       Number of Visits: 24  Reason for Discharge:  Patient reached a stable level of exercise. Patient independent in their exercise.  Smoking History:  History  Smoking Status  . Former Smoker  . Packs/day: 1.00  . Years: 57.00  . Types: Cigarettes  . Start date: 03/27/1958  . Quit date: 12/16/2015  Smokeless Tobacco  . Never Used    Comment: currently smoking 0.5ppd    Diagnosis:  Centrilobular emphysema ([place])  ADL UCSD:     Pulmonary Assessment Scores    Row Name 03/01/16 1601 05/31/16 1515       ADL UCSD   ADL Phase Entry Exit    SOB Score total 90 70    Rest 3 2    Walk 12 10    Stairs 4 4    Bath 4 3    Dress 4 2    Shop 4 3      CAT Score   CAT Score 31 30      mMRC Score   mMRC Score 4 3       Initial Exercise Prescription:     Initial Exercise Prescription - 03/06/16 1100      Date of Initial Exercise RX and Referring Provider   Date 03/01/16   Referring Provider Dr. Lake Bells     Treadmill   MPH 1   Grade 0   Minutes 15   METs 1.7     NuStep   Level 2   Watts 15   Minutes 20   METs 1.9     Prescription Details   Frequency (times per week) 2   Duration Progress to 30 minutes of continuous aerobic without signs/symptoms of physical distress     Intensity   THRR REST +  20   THRR 40-80% of Max Heartrate 107-120-132   Ratings of Perceived Exertion 11-13   Perceived Dyspnea 0-4     Progression   Progression Continue progressive overload as per policy without signs/symptoms or physical distress.     Resistance Training   Training Prescription Yes   Weight 1   Reps 10-12      Discharge Exercise Prescription (Final Exercise Prescription Changes):     Exercise  Prescription Changes - 05/22/16 1500      Exercise Review   Progression Yes     Response to Exercise   Blood Pressure (Admit) 164/80   Blood Pressure (Exercise) 182/86   Blood Pressure (Exit) 164/80   Heart Rate (Admit) 80 bpm   Heart Rate (Exercise) 98 bpm   Heart Rate (Exit) 97 bpm   Oxygen Saturation (Admit) 92 %   Oxygen Saturation (Exercise) 95 %   Oxygen Saturation (Exit) 97 %   Rating of Perceived Exertion (Exercise) 12   Perceived Dyspnea (Exercise) 12   Duration Progress to 30 minutes of continuous aerobic without signs/symptoms of physical distress   Intensity Rest + 20     Progression   Progression Continue progressive overload as per policy without signs/symptoms or physical distress.     Resistance Training   Training Prescription Yes   Weight 2   Reps 10-12     Treadmill   MPH 2.3   Grade 0   Minutes 20  METs 2.7     NuStep   Level 3   Watts 33   Minutes 20   METs 3.59     Home Exercise Plan   Plans to continue exercise at Home   Frequency Add 2 additional days to program exercise sessions.      Functional Capacity:     6 Minute Walk    Row Name 03/01/16 1635 06/01/16 1410       6 Minute Walk   Phase Initial Discharge    Distance 1000 feet 1350 feet    Distance % Change  - 35 %    Walk Time 6 minutes 6 minutes    # of Rest Breaks 0 0    MPH 1.89 2.55    METS 2.45 2.95    RPE 17 14    Perceived Dyspnea  16 14    VO2 Peak 7.43 12.8    Symptoms Yes (comment) No    Comments Extreme SOB. Practiced pursed lip breathing until breathing was back to WNL.   -    Resting HR 81 bpm 83 bpm    Resting BP 170/80 158/78    Max Ex. HR 106 bpm 125 bpm    Max Ex. BP 188/84 210/94    2 Minute Post BP 140/82 170/84       Psychological, QOL, Others - Outcomes: PHQ 2/9: Depression screen [facility] 2/9 05/31/2016 03/01/2016  Decreased Interest 0 0  Down, Depressed, Hopeless 0 0  PHQ - 2 Score 0 0  Altered sleeping 2 1  Tired, decreased energy 3 3   Change in appetite 1 0  Feeling bad or failure about yourself  0 2  Trouble concentrating 0 1  Moving slowly or fidgety/restless 1 1  Suicidal thoughts 0 0  PHQ-9 Score 7 8  Difficult doing work/chores Somewhat difficult Somewhat difficult    Quality of Life:     Quality of Life - 06/01/16 1412      Quality of Life Scores   Health/Function Pre 20.81 %   Health/Function Post 16.3 %   Health/Function % Change -21.67 %   Socioeconomic Pre 20.83 %   Socioeconomic Post 28.93 %   Socioeconomic % Change  38.89 %   Psych/Spiritual Pre 21 %   Psych/Spiritual Post 27.43 %   Psych/Spiritual % Change 30.62 %   Family Pre 21 %   Family Post 30 %   Family % Change 42.86 %   GLOBAL Pre 20.88 %   GLOBAL Post 23.21 %   GLOBAL % Change 11.16 %      Personal Goals: Goals established at orientation with interventions provided to work toward goal.     Personal Goals and Risk Factors at Admission - 03/01/16 1604      Core Components/Risk Factors/Patient Goals on Admission    Weight Management Obesity   Intervention Weight Management: Develop a combined nutrition and exercise program designed to reach desired caloric intake, while maintaining appropriate intake of nutrient and fiber, sodium and fats, and appropriate energy expenditure required for the weight goal.   Sedentary Yes   Intervention Provide advice, education, support and counseling about physical activity/exercise needs.;Develop an individualized exercise prescription for aerobic and resistive training based on initial evaluation findings, risk stratification, comorbidities and participant's personal goals.   Expected Outcomes Achievement of increased cardiorespiratory fitness and enhanced flexibility, muscular endurance and strength shown through measurements of functional capacity and personal statement of participant.   Increase Strength and Stamina Yes  Intervention Provide advice, education, support and counseling about  physical activity/exercise needs.;Develop an individualized exercise prescription for aerobic and resistive training based on initial evaluation findings, risk stratification, comorbidities and participant's personal goals.   Expected Outcomes Achievement of increased cardiorespiratory fitness and enhanced flexibility, muscular endurance and strength shown through measurements of functional capacity and personal statement of participant.   Improve shortness of breath with ADL's Yes   Intervention Provide education, individualized exercise plan and daily activity instruction to help decrease symptoms of SOB with activities of daily living.   Expected Outcomes Short Term: Achieves a reduction of symptoms when performing activities of daily living.   Develop more efficient breathing techniques such as purse lipped breathing and diaphragmatic breathing; and practicing self-pacing with activity Yes   Intervention Provide education, demonstration and support about specific breathing techniuqes utilized for more efficient breathing. Include techniques such as pursed lipped breathing, diaphragmatic breathing and self-pacing activity.   Expected Outcomes Short Term: Participant will be able to demonstrate and use breathing techniques as needed throughout daily activities.   Stress Yes   Intervention Offer individual and/or small group education and counseling on adjustment to heart disease, stress management and health-related lifestyle change. Teach and support self-help strategies.   Expected Outcomes Long Term: Emotional wellbeing is indicated by absence of clinically significant psychosocial distress or social isolation.   Personal Goal Be able to do more ADL's without SOB, Breathe better overall. Be able to do the things he used to do-yard work; fishing; hunting.    Intervention Patient will attend pulmonary rehab 2 days/week and supplement with exercise at home 3 days/week.    Expected Outcomes Patient  will meet his personal goals.        Personal Goals Discharge:     Goals and Risk Factor Review    Row Name 03/01/16 1607 03/29/16 1529 04/25/16 1417 05/24/16 0909 05/31/16 1518     Core Components/Risk Factors/Patient Goals Review   Personal Goals Review Weight Management/Obesity;Sedentary;Increase Strength and Stamina;Improve shortness of breath with ADL's;Develop more efficient breathing techniques such as purse lipped breathing and diaphragmatic breathing and practicing self-pacing with activity.;Stress Weight Management/Obesity;Increase Strength and Stamina;Develop more efficient breathing techniques such as purse lipped breathing and diaphragmatic breathing and practicing self-pacing with activity.;Improve shortness of breath with ADL's;Increase knowledge of respiratory medications and ability to use respiratory devices properly.;Stress  Be able to do more ADL's without SOB, Breathe better overall. Be able to do the things he used to do-yard work; fishing; hunting.  Sedentary;Increase Strength and Stamina;Develop more efficient breathing techniques such as purse lipped breathing and diaphragmatic breathing and practicing self-pacing with activity.;Improve shortness of breath with ADL's;Weight Management/Obesity;Stress  Be able to do more ADL's without SOB, Breathe better overall. Be able to do the things he used to do-yard work; fishing; hunting.  Weight Management/Obesity;Increase Strength and Stamina;Improve shortness of breath with ADL's;Develop more efficient breathing techniques such as purse lipped breathing and diaphragmatic breathing and practicing self-pacing with activity.;Stress  Be able to do more ADL's; Breathe better Weight Management/Obesity;Increase Strength and Stamina;Develop more efficient breathing techniques such as purse lipped breathing and diaphragmatic breathing and practicing self-pacing with activity.;Improve shortness of breath with ADL's;Increase knowledge of  respiratory medications and ability to use respiratory devices properly.  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    Review  - Patient has attended 7 sessions. He is progressing with some increase in his  strength and stamina. He has maintained his weight. Will continue to monitor for progress. Patient has attended 13 sessions gaining 1.7 lbs since starting the program. Patient is progressing well in the program with increased strength and stamina and improvement in his SOB.  Patient has attended 20 sessions. He has gained 3.7 lbs. He is progressing well in the program with improved SOB and increased strength and stamina. He is abe to do more at home. He exercises on his treadmill at home. Upon graduation, patient gained 5.7 lbs. He did well in the program with improved SOB and increased strength and stamina. He is able to do more ADL's with less SOB. He has not resumed fishing, hunting and yardwork d/t the weather but he says he feels strong enough to do those things. He plans to continue exercising on his equipment at home.    Expected Outcomes  - Patient will continue to attend sessions meeting her personal goals.  Patient will continue to attend sessions completing the program meeting his personal goals.  Patient will complete the program meeting his personal goals.  Patient will continue to exercise at home with continued improvement in his SOB, strength and stamina. He will continue to work toward a heart healthy diet and start to meet his weight loss goal.       Nutrition & Weight - Outcomes:     Pre Biometrics - 03/01/16 1640      Pre Biometrics   Height 5\' 8"  (1.727 m)   Weight 186 lb 4.8 oz (84.5 kg)   Waist Circumference 38.5 inches   Hip Circumference 40.5 inches   Waist to Hip Ratio 0.95 %   BMI (Calculated) 28.4   Triceps Skinfold 18 mm   % Body Fat 28 %   Grip Strength 66 kg   Flexibility 0 in   Single Leg  Stand 2 seconds         Post Biometrics - 06/01/16 1411       Post  Biometrics   Height 5\' 8"  (1.727 m)   Weight 186 lb 11 oz (84.7 kg)   Waist Circumference 40 inches   Hip Circumference 40.5 inches   Waist to Hip Ratio 0.99 %   BMI (Calculated) 28.4   Triceps Skinfold 18 mm   % Body Fat 28.8 %   Grip Strength 65 kg   Flexibility 0 in   Single Leg Stand 2 seconds      Nutrition:   Nutrition Discharge:     Nutrition Assessments - 05/31/16 1516      Rate Your Plate Scores   Pre Score 47   Pre Score % 47 %   Post Score 47   Post Score % 47 %   % Change 0 %      Education Questionnaire Score:     Knowledge Questionnaire Score - 05/31/16 1512      Knowledge Questionnaire Score   Pre Score 9/14   Post Score 11/14      Goals reviewed with patient; copy given to patient.

## 2016-06-05 ENCOUNTER — Encounter (HOSPITAL_COMMUNITY): Payer: Medicare Other

## 2016-06-07 ENCOUNTER — Encounter (HOSPITAL_COMMUNITY): Admission: RE | Admit: 2016-06-07 | Payer: Medicare Other | Source: Ambulatory Visit

## 2016-06-12 ENCOUNTER — Encounter (HOSPITAL_COMMUNITY): Payer: Medicare Other

## 2016-06-13 ENCOUNTER — Encounter: Payer: Self-pay | Admitting: Pulmonary Disease

## 2016-06-13 ENCOUNTER — Ambulatory Visit (INDEPENDENT_AMBULATORY_CARE_PROVIDER_SITE_OTHER): Payer: Medicare Other | Admitting: Pulmonary Disease

## 2016-06-13 DIAGNOSIS — J432 Centrilobular emphysema: Secondary | ICD-10-CM

## 2016-06-13 MED ORDER — FLUTICASONE-UMECLIDIN-VILANT 100-62.5-25 MCG/INH IN AEPB
1.0000 | INHALATION_SPRAY | Freq: Every day | RESPIRATORY_TRACT | 0 refills | Status: DC
Start: 2016-06-13 — End: 2016-07-02

## 2016-06-13 MED ORDER — FLUTICASONE-UMECLIDIN-VILANT 100-62.5-25 MCG/INH IN AEPB: 1.0000 | INHALATION_SPRAY | Freq: Every day | RESPIRATORY_TRACT | 3 refills | Status: DC

## 2016-06-13 NOTE — Assessment & Plan Note (Signed)
Dominic Willis continues to struggle with debilitating dyspnea despite maximal medical therapy for his severe COPD and emphysema.  He is frustrated about this.  He has completed pulmonary rehabilitatoin.  I spent 15 minutes with him today trying to explain to him that I can't find another cause for his dyspnea other than his advanced COPD.    He has not had recurrent exacerbations.  Plan: Continue Trelegy, Rx sent Stay active, encouraged exercise F/u 6 months

## 2016-06-13 NOTE — Patient Instructions (Signed)
Stay active Keep exercising as much as you can Take Trelegy one puff daily We will see you back in 4-6 months or sooner if needed

## 2016-06-13 NOTE — Progress Notes (Signed)
Subjective:    Patient ID: Dominic Willis, male    DOB: 08-19-1940, 76 y.o.   MRN: [id number]  Synopsis: Referred in 2017 for COPD.  He quit smoking 12/2015.    HPI Chief Complaint  Patient presents with  . Follow-up    pt c/o worsening sob, prod cough with brown mucus.    [name] has had some cough: > for a few weeks > scratchy throat > brown mucus sometimes, sometimes white  Increasing chest pain and tightness over the last few weeks. This is associated with dyspnea.  He still has trouble doing anything without difficulty breathing.  Its mostly exertion that is making it worse.  Climbing a flght of stairs makes it worse.  Some days his dyspnea is better than others.    He is still using and benefitting form his oxygen while exerting himself.   He just finished pulmonary rehab at [facility] 24 sessions total.  He had his BP go up while exercising.  He hasn't done any exercise since finishing pulmonary rehabilitation.    Past Medical History:  Diagnosis Date  . Actinic keratosis   . Asthma   . COPD (chronic obstructive pulmonary disease) ([place])   . Hypertension   . Insomnia   . Seasonal allergies   . Tobacco abuse       Review of Systems  Constitutional: Positive for fatigue. Negative for chills and fever.  HENT: Negative for postnasal drip, rhinorrhea and sinus pressure.   Respiratory: Positive for cough, shortness of breath and wheezing.   Cardiovascular: Negative for chest pain, palpitations and leg swelling.       Objective:   Physical Exam Vitals:   06/13/16 1409  BP: (!) 154/76  Pulse: 88  SpO2: 98%  Weight: 191 lb (86.6 kg)  Height: 5\' 8"  (1.727 m)   RA  Gen: chronically ill appearing HENT: OP clear, TM's clear, neck supple PULM: Poor air movement B, normal percussion CV: RRR, no mgr, trace edema GI: BS+, soft, nontender Derm: no cyanosis or rash Psyche: normal mood and affect   CBC    Component Value Date/Time   WBC 9.8 01/02/2016 1314     RBC 4.48 01/02/2016 1314   HGB 14.3 01/02/2016 1314   HCT 41.2 01/02/2016 1314   PLT 309.0 01/02/2016 1314   MCV 92.1 01/02/2016 1314   MCHC 34.6 01/02/2016 1314   RDW 14.5 01/02/2016 1314   LYMPHSABS 2.0 01/02/2016 1314   MONOABS 0.7 01/02/2016 1314   EOSABS 0.4 01/02/2016 1314   BASOSABS 0.0 01/02/2016 1314   Imaging: 12/2015 CXR images personally reviewed again today showing emphysema.    PFT: September 2017 simple spirometry clear airflow obstruction, FEV1 0.92 L (32% predicted)  Other tests: February 2017 nuclear stress test> "low risk study".  December 2016 echocardiogram LV EF 123456, grade 1 diastolic dysfunction   6 minute walk in October 2017 walk 288 m over the course of 4 minutes and 27 seconds. O2 saturation dropped no lower than 94%     Assessment & Plan:  Centrilobular emphysema [facility]) Mr. Puskarich continues to struggle with debilitating dyspnea despite maximal medical therapy for his severe COPD and emphysema.  He is frustrated about this.  He has completed pulmonary rehabilitatoin.  I spent 15 minutes with him today trying to explain to him that I can't find another cause for his dyspnea other than his advanced COPD.    He has not had recurrent exacerbations.  Plan: Continue Trelegy, Rx sent Stay  active, encouraged exercise F/u 6 months   Chronic respiratory failure with hypoxemia: Continue nocturnal O2  26 minute visit   Current Outpatient Prescriptions:  .  albuterol (PROVENTIL HFA;VENTOLIN HFA) 108 (90 Base) MCG/ACT inhaler, Inhale 2-4 puffs into the lungs every 6 (six) hours as needed for wheezing or shortness of breath., Disp: , Rfl:  .  albuterol (PROVENTIL) (2.5 MG/3ML) 0.083% nebulizer solution, Take 2.5 mg by nebulization every 6 (six) hours as needed for wheezing or shortness of breath., Disp: , Rfl:  .  ALPRAZolam (XANAX) 1 MG tablet, Take 1 mg by mouth at bedtime., Disp: , Rfl:  .  aspirin EC 81 MG tablet, Take 81 mg by mouth daily., Disp: ,  Rfl:  .  Cyanocobalamin (B-12 SL), Place 1 tablet under the tongue daily., Disp: , Rfl:  .  doxycycline (VIBRA-TABS) 100 MG tablet, Take 100 mg by mouth daily., Disp: , Rfl:  .  fluticasone-salmeterol (ADVAIR HFA) 230-21 MCG/ACT inhaler, Inhale 2 puffs into the lungs 2 (two) times daily., Disp: 1 Inhaler, Rfl: 5 .  loratadine-pseudoephedrine (CLARITIN-D 12-HOUR) 5-120 MG tablet, Take 1 tablet by mouth 2 (two) times daily., Disp: , Rfl:  .  losartan (COZAAR) 50 MG tablet, Take 100 mg by mouth daily. , Disp: , Rfl:  .  Multiple Vitamins-Minerals (VISION VITAMINS PO), Take 1 tablet by mouth 2 (two) times daily., Disp: , Rfl:  .  pantoprazole (PROTONIX) 40 MG tablet, Take 40 mg by mouth daily., Disp: , Rfl:  .  tiotropium (SPIRIVA) 18 MCG inhalation capsule, Place 18 mcg into inhaler and inhale daily., Disp: , Rfl:  .  vitamin E 400 UNIT capsule, Take 400 Units by mouth daily., Disp: , Rfl:  .  Fluticasone-Umeclidin-Vilant (TRELEGY ELLIPTA) 100-62.5-25 MCG/INH AEPB, Inhale 1 puff into the lungs daily., Disp: 180 each, Rfl: 3 .  Fluticasone-Umeclidin-Vilant (TRELEGY ELLIPTA) 100-62.5-25 MCG/INH AEPB, Inhale 1 puff into the lungs daily., Disp: 1 each, Rfl: 0

## 2016-06-14 ENCOUNTER — Encounter (HOSPITAL_COMMUNITY): Payer: Medicare Other

## 2016-06-19 ENCOUNTER — Encounter (HOSPITAL_COMMUNITY): Payer: Medicare Other

## 2016-06-20 DIAGNOSIS — J441 Chronic obstructive pulmonary disease with (acute) exacerbation: Secondary | ICD-10-CM | POA: Diagnosis not present

## 2016-06-20 DIAGNOSIS — Z713 Dietary counseling and surveillance: Secondary | ICD-10-CM | POA: Diagnosis not present

## 2016-06-20 DIAGNOSIS — I10 Essential (primary) hypertension: Secondary | ICD-10-CM | POA: Diagnosis not present

## 2016-06-20 DIAGNOSIS — Z87891 Personal history of nicotine dependence: Secondary | ICD-10-CM | POA: Diagnosis not present

## 2016-06-20 DIAGNOSIS — Z6829 Body mass index (BMI) 29.0-29.9, adult: Secondary | ICD-10-CM | POA: Diagnosis not present

## 2016-06-20 DIAGNOSIS — Z299 Encounter for prophylactic measures, unspecified: Secondary | ICD-10-CM | POA: Diagnosis not present

## 2016-06-21 ENCOUNTER — Encounter (HOSPITAL_COMMUNITY): Payer: Medicare Other

## 2016-06-26 ENCOUNTER — Encounter (HOSPITAL_COMMUNITY): Payer: Medicare Other

## 2016-06-28 ENCOUNTER — Encounter (HOSPITAL_COMMUNITY): Payer: Medicare Other

## 2016-07-02 ENCOUNTER — Encounter: Payer: Self-pay | Admitting: Cardiology

## 2016-07-02 ENCOUNTER — Telehealth: Payer: Self-pay | Admitting: Cardiology

## 2016-07-02 ENCOUNTER — Ambulatory Visit (INDEPENDENT_AMBULATORY_CARE_PROVIDER_SITE_OTHER): Payer: Medicare Other | Admitting: Cardiology

## 2016-07-02 VITALS — BP 137/82 | HR 92 | Ht 68.5 in | Wt 188.0 lb

## 2016-07-02 DIAGNOSIS — R0609 Other forms of dyspnea: Secondary | ICD-10-CM | POA: Diagnosis not present

## 2016-07-02 DIAGNOSIS — R079 Chest pain, unspecified: Secondary | ICD-10-CM

## 2016-07-02 NOTE — Patient Instructions (Signed)
Your physician recommends that you schedule a follow-up appointment in: [place] [address] [place]   Your physician recommends that you continue on your current medications as directed. Please refer to the Current Medication list given to you today.  Your physician has requested that you have a cardiac catheterization. Cardiac catheterization is used to diagnose and/or treat various heart conditions. Doctors may recommend this procedure for a number of different reasons. The most common reason is to evaluate chest pain. Chest pain can be a symptom of coronary artery disease (CAD), and cardiac catheterization can show whether plaque is narrowing or blocking your heart's arteries. This procedure is also used to evaluate the valves, as well as measure the blood flow and oxygen levels in different parts of your heart. For further information please visit [email or web address]. Please follow instruction sheet, as given.

## 2016-07-02 NOTE — Progress Notes (Addendum)
Clinical Summary Dominic Willis is a 76 y.o.male seen today for follow up of the following medical problems.   1. DOE/Chest pain - on inhalers, prednisone,and abx recently. Symptoms not much improved - DOE with walking up one flight of stairs. DOE with using his weedeater at 15 minutes. Limited walking at inclines as well, has to stop and rest.  - can get chest pain at times. Pressure midchest that is variable in severity, Resolves with rest. Symptoms ongoing for several years. Some increase in frequency.  -  completed a nuclear stress test that showed no clear ischemia, echo with LVEF 13-24%, grade I diastolic dysfunction.   - SOB continues. DOE and chest pain with walking to mailbox, something he used to do without any troubles.  - occasioal SOB at rest - can have some chest pain at times. Occurs with walking to mailbox and back. Midchest tightness, 10/10 in severity. - DOE is progressing, chest pain is progressing.     2. COPD - followed by Dr Woody Seller and Dr Lake Bells - PFTs 12/2015 very severe obstruction - wearing night time oxygen Past Medical History:  Diagnosis Date  . Actinic keratosis   . Asthma   . COPD (chronic obstructive pulmonary disease) ([place])   . Hypertension   . Insomnia   . Seasonal allergies   . Tobacco abuse      Allergies  Allergen Reactions  . Brovana [Arformoterol] Shortness Of Breath  . Pulmicort [Budesonide] Shortness Of Breath  . Symbicort [Budesonide-Formoterol Fumarate] Other (See Comments)    headache     Current Outpatient Prescriptions  Medication Sig Dispense Refill  . albuterol (PROVENTIL HFA;VENTOLIN HFA) 108 (90 Base) MCG/ACT inhaler Inhale 2-4 puffs into the lungs every 6 (six) hours as needed for wheezing or shortness of breath.    Marland Kitchen albuterol (PROVENTIL) (2.5 MG/3ML) 0.083% nebulizer solution Take 2.5 mg by nebulization every 6 (six) hours as needed for wheezing or shortness of breath.    . ALPRAZolam (XANAX) 1 MG tablet Take 1 mg  by mouth at bedtime.    Marland Kitchen aspirin EC 81 MG tablet Take 81 mg by mouth daily.    . Cyanocobalamin (B-12 SL) Place 1 tablet under the tongue daily.    Marland Kitchen doxycycline (VIBRA-TABS) 100 MG tablet Take 100 mg by mouth daily.    . fluticasone-salmeterol (ADVAIR HFA) 230-21 MCG/ACT inhaler Inhale 2 puffs into the lungs 2 (two) times daily. 1 Inhaler 5  . Fluticasone-Umeclidin-Vilant (TRELEGY ELLIPTA) 100-62.5-25 MCG/INH AEPB Inhale 1 puff into the lungs daily. 180 each 3  . Fluticasone-Umeclidin-Vilant (TRELEGY ELLIPTA) 100-62.5-25 MCG/INH AEPB Inhale 1 puff into the lungs daily. 1 each 0  . loratadine-pseudoephedrine (CLARITIN-D 12-HOUR) 5-120 MG tablet Take 1 tablet by mouth 2 (two) times daily.    Marland Kitchen losartan (COZAAR) 50 MG tablet Take 100 mg by mouth daily.     . Multiple Vitamins-Minerals (VISION VITAMINS PO) Take 1 tablet by mouth 2 (two) times daily.    . pantoprazole (PROTONIX) 40 MG tablet Take 40 mg by mouth daily.    Marland Kitchen tiotropium (SPIRIVA) 18 MCG inhalation capsule Place 18 mcg into inhaler and inhale daily.    . vitamin E 400 UNIT capsule Take 400 Units by mouth daily.     No current facility-administered medications for this visit.      Past Surgical History:  Procedure Laterality Date  . BREAST BIOPSY Left   . INGUINAL HERNIA REPAIR    . LUMBAR LAMINECTOMY  Dr. Joya Salm  . SHOULDER SURGERY Left      Allergies  Allergen Reactions  . Brovana [Arformoterol] Shortness Of Breath  . Pulmicort [Budesonide] Shortness Of Breath  . Symbicort [Budesonide-Formoterol Fumarate] Other (See Comments)    headache      Family History  Problem Relation Age of Onset  . Heart disease Father   . Heart disease Mother      Social History Dominic Willis reports that he quit smoking about 6 months ago. His smoking use included Cigarettes. He started smoking about 58 years ago. He has a 57.00 pack-year smoking history. He has never used smokeless tobacco. Dominic Willis has no alcohol history on  file.   Review of Systems CONSTITUTIONAL: No weight loss, fever, chills, weakness or fatigue.  HEENT: Eyes: No visual loss, blurred vision, double vision or yellow sclerae.No hearing loss, sneezing, congestion, runny nose or sore throat.  SKIN: No rash or itching.  CARDIOVASCULAR: per hpi RESPIRATORY: per hpi GASTROINTESTINAL: No anorexia, nausea, vomiting or diarrhea. No abdominal pain or blood.  GENITOURINARY: No burning on urination, no polyuria NEUROLOGICAL: No headache, dizziness, syncope, paralysis, ataxia, numbness or tingling in the extremities. No change in bowel or bladder control.  MUSCULOSKELETAL: No muscle, back pain, joint pain or stiffness.  LYMPHATICS: No enlarged nodes. No history of splenectomy.  PSYCHIATRIC: No history of depression or anxiety.  ENDOCRINOLOGIC: No reports of sweating, cold or heat intolerance. No polyuria or polydipsia.  Marland Kitchen   Physical Examination Vitals:   07/02/16 1415  BP: 137/82  Pulse: 92   Vitals:   07/02/16 1415  Weight: 188 lb (85.3 kg)  Height: 5' 8.5" (1.74 m)    Gen: resting comfortably, no acute distress HEENT: no scleral icterus, pupils equal round and reactive, no palptable cervical adenopathy,  CV: RRR, no m/r/g, no jvd Resp: Clear to auscultation bilaterally GI: abdomen is soft, non-tender, non-distended, normal bowel sounds, no hepatosplenomegaly MSK: extremities are warm, no edema.  Skin: warm, no rash Neuro:  no focal deficits Psych: appropriate affect   Diagnostic Studies 03/2015 echo Study Conclusions  - Procedure narrative: Transthoracic echocardiography for left ventricular function evaluation, for right ventricular function evaluation, and for assessment of valvular function. Image quality was adequate. The study was technically difficult, as a result of poor sound wave transmission. - Left ventricle: The cavity size was normal. There was mild concentric hypertrophy. Systolic function was normal.  The estimated ejection fraction was in the range of 60% to 65%. Images were inadequate for LV wall motion assessment. However, no gross regional variation on available images including apical 4-chamber views. Doppler parameters are consistent with abnormal left ventricular relaxation (grade 1 diastolic dysfunction).   05/2015 MPI  Diffuse resting ST segment abnormalities.  Fixed inferior wall defect extending from apex to base with some inferoseptal involvement which is likely due to soft tissue attenuation artifact. No ischemic territories.  This is a low risk study.  Nuclear stress EF: 55%.     Assessment and Plan   1. DOE/Chest pain - symptoms are progressing. Unclear if only related to his COPD alone. Given the exertoinal chest pain I have concern about possible underlying CAD, perhaps a false negative nuclear stress - will plan for LHC/RHC to further evaluate his progressing DOE and chest pain - EKG in clinic today shows SR, no acute ischemic changes   F/u 1 month   I have reviewed the risks, indications, and alternatives to cardiac catheterization, possible angioplasty, and stenting with the patient today. Risks  include but are not limited to bleeding, infection, vascular injury, stroke, myocardial infection, arrhythmia, kidney injury, radiation-related injury in the case of prolonged fluoroscopy use, emergency cardiac surgery, and death. The patient understands the risks of serious complication is 1-2 in 4619 with diagnostic cardiac cath and 1-2% or less with angioplasty/stenting.  Arnoldo Lenis, M.D.,

## 2016-07-02 NOTE — Telephone Encounter (Signed)
Pre-cert Verification for the following procedure   CATH 07/10/16 WITH VARANASI AT 900AM

## 2016-07-03 ENCOUNTER — Encounter (HOSPITAL_COMMUNITY): Payer: Medicare Other

## 2016-07-04 ENCOUNTER — Other Ambulatory Visit: Payer: Self-pay | Admitting: Cardiology

## 2016-07-04 DIAGNOSIS — R0789 Other chest pain: Secondary | ICD-10-CM

## 2016-07-05 ENCOUNTER — Encounter (HOSPITAL_COMMUNITY): Payer: Medicare Other

## 2016-07-05 ENCOUNTER — Telehealth: Payer: Self-pay | Admitting: *Deleted

## 2016-07-05 NOTE — Telephone Encounter (Signed)
Pt aware - routed to pcp  

## 2016-07-05 NOTE — Telephone Encounter (Signed)
-----   Message from Arnoldo Lenis, MD sent at 07/05/2016  1:07 PM EDT ----- Labs look good  Zandra Abts MD

## 2016-07-09 ENCOUNTER — Other Ambulatory Visit: Payer: Self-pay | Admitting: Cardiology

## 2016-07-10 ENCOUNTER — Encounter (HOSPITAL_COMMUNITY)
Admission: RE | Disposition: A | Discharge status: Home / Self Care | Payer: Self-pay | Source: Ambulatory Visit | Attending: Interventional Cardiology

## 2016-07-10 ENCOUNTER — Encounter (HOSPITAL_COMMUNITY): Payer: Self-pay | Admitting: Interventional Cardiology

## 2016-07-10 ENCOUNTER — Ambulatory Visit (HOSPITAL_COMMUNITY)
Admission: RE | Admit: 2016-07-10 | Discharge: 2016-07-10 | Disposition: A | Discharge status: Home / Self Care | Payer: Medicare Other | Source: Ambulatory Visit | Attending: Interventional Cardiology | Admitting: Interventional Cardiology

## 2016-07-10 DIAGNOSIS — Z87891 Personal history of nicotine dependence: Secondary | ICD-10-CM | POA: Insufficient documentation

## 2016-07-10 DIAGNOSIS — Z7951 Long term (current) use of inhaled steroids: Secondary | ICD-10-CM | POA: Insufficient documentation

## 2016-07-10 DIAGNOSIS — Z8249 Family history of ischemic heart disease and other diseases of the circulatory system: Secondary | ICD-10-CM | POA: Insufficient documentation

## 2016-07-10 DIAGNOSIS — R0789 Other chest pain: Secondary | ICD-10-CM

## 2016-07-10 DIAGNOSIS — I10 Essential (primary) hypertension: Secondary | ICD-10-CM | POA: Diagnosis not present

## 2016-07-10 DIAGNOSIS — R0609 Other forms of dyspnea: Secondary | ICD-10-CM | POA: Diagnosis not present

## 2016-07-10 DIAGNOSIS — Z7982 Long term (current) use of aspirin: Secondary | ICD-10-CM | POA: Insufficient documentation

## 2016-07-10 DIAGNOSIS — J449 Chronic obstructive pulmonary disease, unspecified: Secondary | ICD-10-CM | POA: Diagnosis not present

## 2016-07-10 DIAGNOSIS — E8770 Fluid overload, unspecified: Secondary | ICD-10-CM | POA: Insufficient documentation

## 2016-07-10 DIAGNOSIS — G4700 Insomnia, unspecified: Secondary | ICD-10-CM | POA: Diagnosis not present

## 2016-07-10 DIAGNOSIS — I2510 Atherosclerotic heart disease of native coronary artery without angina pectoris: Secondary | ICD-10-CM | POA: Diagnosis not present

## 2016-07-10 HISTORY — PX: RIGHT/LEFT HEART CATH AND CORONARY ANGIOGRAPHY: CATH118266

## 2016-07-10 LAB — POCT I-STAT 3, VENOUS BLOOD GAS (G3P V)
Bicarbonate: 25.7 mmol/L (ref 20.0–28.0)
O2 SAT: 64 %
PCO2 VEN: 42.4 mmHg — AB (ref 44.0–60.0)
TCO2: 27 mmol/L (ref 0–100)
pH, Ven: 7.39 (ref 7.250–7.430)
pO2, Ven: 34 mmHg (ref 32.0–45.0)

## 2016-07-10 LAB — POCT I-STAT 3, ART BLOOD GAS (G3+)
BICARBONATE: 24.8 mmol/L (ref 20.0–28.0)
O2 Saturation: 92 %
PH ART: 7.403 (ref 7.350–7.450)
TCO2: 26 mmol/L (ref 0–100)
pCO2 arterial: 39.7 mmHg (ref 32.0–48.0)
pO2, Arterial: 62 mmHg — ABNORMAL LOW (ref 83.0–108.0)

## 2016-07-10 LAB — PROTIME-INR
INR: 1.07
Prothrombin Time: 13.9 seconds (ref 11.4–15.2)

## 2016-07-10 SURGERY — RIGHT/LEFT HEART CATH AND CORONARY ANGIOGRAPHY
Anesthesia: LOCAL

## 2016-07-10 MED ORDER — ASPIRIN 81 MG PO CHEW
CHEWABLE_TABLET | ORAL | Status: AC
  Filled 2016-07-10: qty 1

## 2016-07-10 MED ORDER — SODIUM CHLORIDE 0.9% FLUSH: 3.0000 mL | Freq: Two times a day (BID) | INTRAVENOUS | Status: DC

## 2016-07-10 MED ORDER — MIDAZOLAM HCL 2 MG/2ML IJ SOLN
INTRAMUSCULAR | Status: DC | PRN
  Administered 2016-07-10: 2 mg via INTRAVENOUS

## 2016-07-10 MED ORDER — LIDOCAINE HCL (PF) 1 % IJ SOLN
INTRAMUSCULAR | Status: AC
  Filled 2016-07-10: qty 30

## 2016-07-10 MED ORDER — SODIUM CHLORIDE 0.9 % IV SOLN: 250.0000 mL | INTRAVENOUS | Status: DC | PRN

## 2016-07-10 MED ORDER — IOPAMIDOL (ISOVUE-370) INJECTION 76%
INTRAVENOUS | Status: DC | PRN
  Administered 2016-07-10: 60 mL via INTRAVENOUS

## 2016-07-10 MED ORDER — SODIUM CHLORIDE 0.9 % WEIGHT BASED INFUSION: 1.0000 mL/kg/h | INTRAVENOUS | Status: DC

## 2016-07-10 MED ORDER — HEPARIN (PORCINE) IN NACL 2-0.9 UNIT/ML-% IJ SOLN
INTRAMUSCULAR | Status: DC | PRN
  Administered 2016-07-10: 1000 mL

## 2016-07-10 MED ORDER — VERAPAMIL HCL 2.5 MG/ML IV SOLN
INTRAVENOUS | Status: DC | PRN
  Administered 2016-07-10: 10 mL via INTRA_ARTERIAL

## 2016-07-10 MED ORDER — LIDOCAINE HCL (PF) 1 % IJ SOLN
INTRAMUSCULAR | Status: DC | PRN
  Administered 2016-07-10: 2 mL
  Administered 2016-07-10: 1 mL

## 2016-07-10 MED ORDER — HEPARIN SODIUM (PORCINE) 1000 UNIT/ML IJ SOLN
INTRAMUSCULAR | Status: DC | PRN
  Administered 2016-07-10: 4500 [IU] via INTRAVENOUS

## 2016-07-10 MED ORDER — HYDRALAZINE HCL 20 MG/ML IJ SOLN
INTRAMUSCULAR | Status: AC
  Administered 2016-07-10: 10 mg via INTRAVENOUS
  Filled 2016-07-10: qty 1

## 2016-07-10 MED ORDER — IOPAMIDOL (ISOVUE-370) INJECTION 76%
INTRAVENOUS | Status: AC
  Filled 2016-07-10: qty 100

## 2016-07-10 MED ORDER — VERAPAMIL HCL 2.5 MG/ML IV SOLN
INTRAVENOUS | Status: AC
  Filled 2016-07-10: qty 2

## 2016-07-10 MED ORDER — ASPIRIN 81 MG PO CHEW
81.0000 mg | CHEWABLE_TABLET | ORAL | Status: AC
  Administered 2016-07-10: 81 mg via ORAL

## 2016-07-10 MED ORDER — SODIUM CHLORIDE 0.9 % WEIGHT BASED INFUSION
3.0000 mL/kg/h | INTRAVENOUS | Status: DC
  Administered 2016-07-10: 3 mL/kg/h via INTRAVENOUS

## 2016-07-10 MED ORDER — FENTANYL CITRATE (PF) 100 MCG/2ML IJ SOLN
INTRAMUSCULAR | Status: DC | PRN
  Administered 2016-07-10: 25 ug via INTRAVENOUS

## 2016-07-10 MED ORDER — SODIUM CHLORIDE 0.9 % IV SOLN: INTRAVENOUS | Status: DC

## 2016-07-10 MED ORDER — SODIUM CHLORIDE 0.9% FLUSH: 3.0000 mL | INTRAVENOUS | Status: DC | PRN

## 2016-07-10 MED ORDER — HEPARIN (PORCINE) IN NACL 2-0.9 UNIT/ML-% IJ SOLN
INTRAMUSCULAR | Status: AC
  Filled 2016-07-10: qty 1000

## 2016-07-10 MED ORDER — HEPARIN SODIUM (PORCINE) 1000 UNIT/ML IJ SOLN
INTRAMUSCULAR | Status: AC
Start: 2016-07-10 — End: ?
  Filled 2016-07-10: qty 1

## 2016-07-10 MED ORDER — FENTANYL CITRATE (PF) 100 MCG/2ML IJ SOLN
INTRAMUSCULAR | Status: AC
  Filled 2016-07-10: qty 2

## 2016-07-10 MED ORDER — MIDAZOLAM HCL 2 MG/2ML IJ SOLN
INTRAMUSCULAR | Status: AC
  Filled 2016-07-10: qty 2

## 2016-07-10 MED ORDER — HYDRALAZINE HCL 20 MG/ML IJ SOLN
10.0000 mg | INTRAMUSCULAR | Status: DC
  Administered 2016-07-10: 10 mg via INTRAVENOUS

## 2016-07-10 SURGICAL SUPPLY — 16 items
CATH BALLN WEDGE 5F 110CM (CATHETERS) ×2 IMPLANT
CATH INFINITI 5 FR JL3.5 (CATHETERS) ×2 IMPLANT
CATH INFINITI 5FR ANG PIGTAIL (CATHETERS) ×2 IMPLANT
CATH INFINITI JR4 5F (CATHETERS) ×2 IMPLANT
DEVICE RAD COMP TR BAND LRG (VASCULAR PRODUCTS) ×2 IMPLANT
GLIDESHEATH SLEND SS 6F .021 (SHEATH) ×2 IMPLANT
GUIDEWIRE .025 260CM (WIRE) ×2 IMPLANT
GUIDEWIRE INQWIRE 1.5J.035X260 (WIRE) ×1 IMPLANT
INQWIRE 1.5J .035X260CM (WIRE) ×2
KIT HEART LEFT (KITS) ×2 IMPLANT
PACK CARDIAC CATHETERIZATION (CUSTOM PROCEDURE TRAY) ×2 IMPLANT
SHEATH FAST CATH BRACH 5F 5CM (SHEATH) ×2 IMPLANT
SYR MEDRAD MARK V 150ML (SYRINGE) ×2 IMPLANT
TRANSDUCER W/STOPCOCK (MISCELLANEOUS) ×2 IMPLANT
TUBING CIL FLEX 10 FLL-RA (TUBING) ×2 IMPLANT
WIRE HI TORQ VERSACORE-J 145CM (WIRE) ×2 IMPLANT

## 2016-07-10 NOTE — Interval H&P Note (Signed)
Cath Lab Visit (complete for each Cath Lab visit)  Clinical Evaluation Leading to the Procedure:   ACS: No.  Non-ACS:    Anginal Classification: CCS III  Anti-ischemic medical therapy: Minimal Therapy (1 class of medications)  Non-Invasive Test Results: Low-risk stress test findings: cardiac mortality <1%/year  Prior CABG: No previous CABG   February 2017 nuclear - normal   History and Physical Interval Note:  07/10/2016 8:57 AM  Dominic Willis  has presented today for surgery, with the diagnosis of cp  The various methods of treatment have been discussed with the patient and family. After consideration of risks, benefits and other options for treatment, the patient has consented to  Procedure(s): Right/Left Heart Cath and Coronary Angiography (N/A) as a surgical intervention .  The patient's history has been reviewed, patient examined, no change in status, stable for surgery.  I have reviewed the patient's chart and labs.  Questions were answered to the patient's satisfaction.     Larae Grooms

## 2016-07-10 NOTE — Discharge Instructions (Signed)
Radial Site Care °Refer to this sheet in the next few weeks. These instructions provide you with information about caring for yourself after your procedure. Your health care provider may also give you more specific instructions. Your treatment has been planned according to current medical practices, but problems sometimes occur. Call your health care provider if you have any problems or questions after your procedure. °What can I expect after the procedure? °After your procedure, it is typical to have the following: °· Bruising at the radial site that usually fades within 1-2 weeks. °· Blood collecting in the tissue (hematoma) that may be painful to the touch. It should usually decrease in size and tenderness within 1-2 weeks. °Follow these instructions at home: °· Take medicines only as directed by your health care provider. °· You may shower 24-48 hours after the procedure or as directed by your health care provider. Remove the bandage (dressing) and gently wash the site with plain soap and water. Pat the area dry with a clean towel. Do not rub the site, because this may cause bleeding. °· Do not take baths, swim, or use a hot tub until your health care provider approves. °· Check your insertion site every day for redness, swelling, or drainage. °· Do not apply powder or lotion to the site. °· Do not flex or bend the affected arm for 24 hours or as directed by your health care provider. °· Do not push or pull heavy objects with the affected arm for 24 hours or as directed by your health care provider. °· Do not lift over 10 lb (4.5 kg) for 5 days after your procedure or as directed by your health care provider. °· Ask your health care provider when it is okay to: °¨ Return to work or school. °¨ Resume usual physical activities or sports. °¨ Resume sexual activity. °· Do not drive home if you are discharged the same day as the procedure. Have someone else drive you. °· You may drive 24 hours after the procedure  unless otherwise instructed by your health care provider. °· Do not operate machinery or power tools for 24 hours after the procedure. °· If your procedure was done as an outpatient procedure, which means that you went home the same day as your procedure, a responsible adult should be with you for the first 24 hours after you arrive home. °· Keep all follow-up visits as directed by your health care provider. This is important. °Contact a health care provider if: °· You have a fever. °· You have chills. °· You have increased bleeding from the radial site. Hold pressure on the site. °Get help right away if: °· You have unusual pain at the radial site. °· You have redness, warmth, or swelling at the radial site. °· You have drainage (other than a small amount of blood on the dressing) from the radial site. °· The radial site is bleeding, and the bleeding does not stop after 30 minutes of holding steady pressure on the site. °· Your arm or hand becomes pale, cool, tingly, or numb. °This information is not intended to replace advice given to you by your health care provider. Make sure you discuss any questions you have with your health care provider. °Document Released: 05/05/2010 Document Revised: 09/08/2015 Document Reviewed: 10/19/2013 °Elsevier Interactive Patient Education © 2017 [employer] ° °

## 2016-07-10 NOTE — H&P (View-Only) (Signed)
Clinical Summary Dominic Willis is a 76 y.o.male seen today for follow up of the following medical problems.   1. DOE/Chest pain - on inhalers, prednisone,and abx recently. Symptoms not much improved - DOE with walking up one flight of stairs. DOE with using his weedeater at 15 minutes. Limited walking at inclines as well, has to stop and rest.  - can get chest pain at times. Pressure midchest that is variable in severity, Resolves with rest. Symptoms ongoing for several years. Some increase in frequency.  -  completed a nuclear stress test that showed no clear ischemia, echo with LVEF 01-60%, grade I diastolic dysfunction.   - SOB continues. DOE and chest pain with walking to mailbox, something he used to do without any troubles.  - occasioal SOB at rest - can have some chest pain at times. Occurs with walking to mailbox and back. Midchest tightness, 10/10 in severity. - DOE is progressing, chest pain is progressing.     2. COPD - followed by Dr Woody Seller and Dr Lake Bells - PFTs 12/2015 very severe obstruction - wearing night time oxygen Past Medical History:  Diagnosis Date  . Actinic keratosis   . Asthma   . COPD (chronic obstructive pulmonary disease) ([place])   . Hypertension   . Insomnia   . Seasonal allergies   . Tobacco abuse      Allergies  Allergen Reactions  . Brovana [Arformoterol] Shortness Of Breath  . Pulmicort [Budesonide] Shortness Of Breath  . Symbicort [Budesonide-Formoterol Fumarate] Other (See Comments)    headache     Current Outpatient Prescriptions  Medication Sig Dispense Refill  . albuterol (PROVENTIL HFA;VENTOLIN HFA) 108 (90 Base) MCG/ACT inhaler Inhale 2-4 puffs into the lungs every 6 (six) hours as needed for wheezing or shortness of breath.    Marland Kitchen albuterol (PROVENTIL) (2.5 MG/3ML) 0.083% nebulizer solution Take 2.5 mg by nebulization every 6 (six) hours as needed for wheezing or shortness of breath.    . ALPRAZolam (XANAX) 1 MG tablet Take 1 mg  by mouth at bedtime.    Marland Kitchen aspirin EC 81 MG tablet Take 81 mg by mouth daily.    . Cyanocobalamin (B-12 SL) Place 1 tablet under the tongue daily.    Marland Kitchen doxycycline (VIBRA-TABS) 100 MG tablet Take 100 mg by mouth daily.    . fluticasone-salmeterol (ADVAIR HFA) 230-21 MCG/ACT inhaler Inhale 2 puffs into the lungs 2 (two) times daily. 1 Inhaler 5  . Fluticasone-Umeclidin-Vilant (TRELEGY ELLIPTA) 100-62.5-25 MCG/INH AEPB Inhale 1 puff into the lungs daily. 180 each 3  . Fluticasone-Umeclidin-Vilant (TRELEGY ELLIPTA) 100-62.5-25 MCG/INH AEPB Inhale 1 puff into the lungs daily. 1 each 0  . loratadine-pseudoephedrine (CLARITIN-D 12-HOUR) 5-120 MG tablet Take 1 tablet by mouth 2 (two) times daily.    Marland Kitchen losartan (COZAAR) 50 MG tablet Take 100 mg by mouth daily.     . Multiple Vitamins-Minerals (VISION VITAMINS PO) Take 1 tablet by mouth 2 (two) times daily.    . pantoprazole (PROTONIX) 40 MG tablet Take 40 mg by mouth daily.    Marland Kitchen tiotropium (SPIRIVA) 18 MCG inhalation capsule Place 18 mcg into inhaler and inhale daily.    . vitamin E 400 UNIT capsule Take 400 Units by mouth daily.     No current facility-administered medications for this visit.      Past Surgical History:  Procedure Laterality Date  . BREAST BIOPSY Left   . INGUINAL HERNIA REPAIR    . LUMBAR LAMINECTOMY  Dr. Joya Salm  . SHOULDER SURGERY Left      Allergies  Allergen Reactions  . Brovana [Arformoterol] Shortness Of Breath  . Pulmicort [Budesonide] Shortness Of Breath  . Symbicort [Budesonide-Formoterol Fumarate] Other (See Comments)    headache      Family History  Problem Relation Age of Onset  . Heart disease Father   . Heart disease Mother      Social History Mr. Schappell reports that he quit smoking about 6 months ago. His smoking use included Cigarettes. He started smoking about 58 years ago. He has a 57.00 pack-year smoking history. He has never used smokeless tobacco. Mr. Baller has no alcohol history on  file.   Review of Systems CONSTITUTIONAL: No weight loss, fever, chills, weakness or fatigue.  HEENT: Eyes: No visual loss, blurred vision, double vision or yellow sclerae.No hearing loss, sneezing, congestion, runny nose or sore throat.  SKIN: No rash or itching.  CARDIOVASCULAR: per hpi RESPIRATORY: per hpi GASTROINTESTINAL: No anorexia, nausea, vomiting or diarrhea. No abdominal pain or blood.  GENITOURINARY: No burning on urination, no polyuria NEUROLOGICAL: No headache, dizziness, syncope, paralysis, ataxia, numbness or tingling in the extremities. No change in bowel or bladder control.  MUSCULOSKELETAL: No muscle, back pain, joint pain or stiffness.  LYMPHATICS: No enlarged nodes. No history of splenectomy.  PSYCHIATRIC: No history of depression or anxiety.  ENDOCRINOLOGIC: No reports of sweating, cold or heat intolerance. No polyuria or polydipsia.  Marland Kitchen   Physical Examination Vitals:   07/02/16 1415  BP: 137/82  Pulse: 92   Vitals:   07/02/16 1415  Weight: 188 lb (85.3 kg)  Height: 5' 8.5" (1.74 m)    Gen: resting comfortably, no acute distress HEENT: no scleral icterus, pupils equal round and reactive, no palptable cervical adenopathy,  CV: RRR, no m/r/g, no jvd Resp: Clear to auscultation bilaterally GI: abdomen is soft, non-tender, non-distended, normal bowel sounds, no hepatosplenomegaly MSK: extremities are warm, no edema.  Skin: warm, no rash Neuro:  no focal deficits Psych: appropriate affect   Diagnostic Studies 03/2015 echo Study Conclusions  - Procedure narrative: Transthoracic echocardiography for left ventricular function evaluation, for right ventricular function evaluation, and for assessment of valvular function. Image quality was adequate. The study was technically difficult, as a result of poor sound wave transmission. - Left ventricle: The cavity size was normal. There was mild concentric hypertrophy. Systolic function was normal.  The estimated ejection fraction was in the range of 60% to 65%. Images were inadequate for LV wall motion assessment. However, no gross regional variation on available images including apical 4-chamber views. Doppler parameters are consistent with abnormal left ventricular relaxation (grade 1 diastolic dysfunction).   05/2015 MPI  Diffuse resting ST segment abnormalities.  Fixed inferior wall defect extending from apex to base with some inferoseptal involvement which is likely due to soft tissue attenuation artifact. No ischemic territories.  This is a low risk study.  Nuclear stress EF: 55%.     Assessment and Plan   1. DOE/Chest pain - symptoms are progressing. Unclear if only related to his COPD alone. Given the exertoinal chest pain I have concern about possible underlying CAD, perhaps a false negative nuclear stress - will plan for LHC/RHC to further evaluate his progressing DOE and chest pain - EKG in clinic today shows SR, no acute ischemic changes   F/u 1 month   I have reviewed the risks, indications, and alternatives to cardiac catheterization, possible angioplasty, and stenting with the patient today. Risks  include but are not limited to bleeding, infection, vascular injury, stroke, myocardial infection, arrhythmia, kidney injury, radiation-related injury in the case of prolonged fluoroscopy use, emergency cardiac surgery, and death. The patient understands the risks of serious complication is 1-2 in 3494 with diagnostic cardiac cath and 1-2% or less with angioplasty/stenting.  Dominic Willis, M.D.,

## 2016-07-24 DIAGNOSIS — I10 Essential (primary) hypertension: Secondary | ICD-10-CM | POA: Diagnosis not present

## 2016-07-24 DIAGNOSIS — R0600 Dyspnea, unspecified: Secondary | ICD-10-CM | POA: Diagnosis not present

## 2016-07-24 DIAGNOSIS — J441 Chronic obstructive pulmonary disease with (acute) exacerbation: Secondary | ICD-10-CM | POA: Diagnosis not present

## 2016-07-24 DIAGNOSIS — Z299 Encounter for prophylactic measures, unspecified: Secondary | ICD-10-CM | POA: Diagnosis not present

## 2016-07-24 DIAGNOSIS — J449 Chronic obstructive pulmonary disease, unspecified: Secondary | ICD-10-CM | POA: Diagnosis not present

## 2016-07-24 DIAGNOSIS — R5383 Other fatigue: Secondary | ICD-10-CM | POA: Diagnosis not present

## 2016-07-24 DIAGNOSIS — Z6828 Body mass index (BMI) 28.0-28.9, adult: Secondary | ICD-10-CM | POA: Diagnosis not present

## 2016-07-24 DIAGNOSIS — R0989 Other specified symptoms and signs involving the circulatory and respiratory systems: Secondary | ICD-10-CM | POA: Diagnosis not present

## 2016-07-24 DIAGNOSIS — F329 Major depressive disorder, single episode, unspecified: Secondary | ICD-10-CM | POA: Diagnosis not present

## 2016-07-25 ENCOUNTER — Ambulatory Visit (INDEPENDENT_AMBULATORY_CARE_PROVIDER_SITE_OTHER): Payer: Medicare Other | Admitting: Cardiology

## 2016-07-25 ENCOUNTER — Encounter: Payer: Self-pay | Admitting: Cardiology

## 2016-07-25 VITALS — BP 152/83 | HR 80 | Ht 68.0 in | Wt 183.0 lb

## 2016-07-25 DIAGNOSIS — I10 Essential (primary) hypertension: Secondary | ICD-10-CM | POA: Diagnosis not present

## 2016-07-25 DIAGNOSIS — I5033 Acute on chronic diastolic (congestive) heart failure: Secondary | ICD-10-CM

## 2016-07-25 DIAGNOSIS — R0609 Other forms of dyspnea: Secondary | ICD-10-CM | POA: Diagnosis not present

## 2016-07-25 MED ORDER — CHLORTHALIDONE 25 MG PO TABS: 12.5000 mg | ORAL_TABLET | Freq: Every day | ORAL | 1 refills | Status: DC

## 2016-07-25 NOTE — Progress Notes (Signed)
Clinical Summary Mr. Luecke is a 76 y.o.male seen today for follow up of the following medical problems.   1. DOE/Chest pain - on inhalers, prednisone,and abx recently. Symptoms not much improved - DOE with walking up one flight of stairs. DOE with using his weedeater at 15 minutes. Limited walking at inclines as well, has to stop and rest.  - can get chest pain at times. Pressure midchest that is variable in severity, Resolves with rest. Symptoms ongoing for several years. Some increase in frequency.  -  completed a nuclear stress test that showed no clear ischemia, echo with LVEF 78-93%, grade I diastolic dysfunction.   - SOB continues. DOE and chest pain with walking to mailbox, something he used to do without any troubles.  - occasioal SOB at rest - can have some chest pain at times. Occurs with walking to mailbox and back. Midchest tightness, 10/10 in severity. - DOE is progressing, chest pain is progressing.   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 Mean PA 23, LVEDP 24, CI 2.3 - can have some abdominal distension at times. Ongoing SOB.     2. COPD - followed by Dr Woody Seller and Dr Lake Bells - PFTs 12/2015 very severe obstruction - wearing night time oxygen  3. HTN - he remains compliant with meds Past Medical History:  Diagnosis Date  . Actinic keratosis   . Asthma   . COPD (chronic obstructive pulmonary disease) ([place])   . Hypertension   . Insomnia   . Seasonal allergies   . Tobacco abuse      Allergies  Allergen Reactions  . Brovana [Arformoterol] Shortness Of Breath  . Pulmicort [Budesonide] Shortness Of Breath  . Symbicort [Budesonide-Formoterol Fumarate] Other (See Comments)    headache     Current Outpatient Prescriptions  Medication Sig Dispense Refill  . Acetylcysteine (N-ACETYL-L-CYSTEINE PO) Take 500 mg by mouth 2 (two) times daily.    Marland Kitchen albuterol (PROVENTIL HFA;VENTOLIN HFA) 108 (90 Base) MCG/ACT inhaler Inhale 2-4 puffs into the lungs every 6  (six) hours as needed for wheezing or shortness of breath.    Marland Kitchen albuterol (PROVENTIL) (2.5 MG/3ML) 0.083% nebulizer solution Take 2.5 mg by nebulization every 4 (four) hours as needed for wheezing or shortness of breath.     . ALPRAZolam (XANAX) 1 MG tablet Take 1 mg by mouth at bedtime.    Marland Kitchen aspirin EC 81 MG tablet Take 81 mg by mouth at bedtime.     Marland Kitchen b complex vitamins tablet Take 1 tablet by mouth daily.    . Cholecalciferol (VITAMIN D3 PO) Take 1 capsule by mouth daily.    . Cyanocobalamin (B-12 SL) Place 1 tablet under the tongue daily.    Marland Kitchen doxycycline (VIBRA-TABS) 100 MG tablet Take 100 mg by mouth daily.    . Fluticasone-Salmeterol (ADVAIR HFA IN) Inhale 2 puffs into the lungs 2 (two) times daily.    Marland Kitchen loratadine-pseudoephedrine (CLARITIN-D 12-HOUR) 5-120 MG tablet Take 1 tablet by mouth 2 (two) times daily.    Marland Kitchen losartan (COZAAR) 100 MG tablet Take 100 mg by mouth daily.    . Multiple Vitamins-Minerals (VISION VITAMINS PO) Take 1 tablet by mouth 2 (two) times daily.    . naproxen sodium (ANAPROX) 220 MG tablet Take 440 mg by mouth as needed (pain).    . pantoprazole (PROTONIX) 40 MG tablet Take 40 mg by mouth daily.    Marland Kitchen tiotropium (SPIRIVA) 18 MCG inhalation capsule Place 18 mcg into inhaler and inhale daily.    Marland Kitchen  vitamin E 400 UNIT capsule Take 400 Units by mouth daily.     No current facility-administered medications for this visit.      Past Surgical History:  Procedure Laterality Date  . BREAST BIOPSY Left   . INGUINAL HERNIA REPAIR    . LUMBAR LAMINECTOMY     Dr. Joya Salm  . RIGHT/LEFT HEART CATH AND CORONARY ANGIOGRAPHY N/A 07/10/2016   Procedure: Right/Left Heart Cath and Coronary Angiography;  Surgeon: Jettie Booze, MD;  Location: [facility];  Service: Cardiovascular;  Laterality: N/A;  . SHOULDER SURGERY Left      Allergies  Allergen Reactions  . Brovana [Arformoterol] Shortness Of Breath  . Pulmicort [Budesonide] Shortness Of Breath  . Symbicort  [Budesonide-Formoterol Fumarate] Other (See Comments)    headache      Family History  Problem Relation Age of Onset  . Heart disease Father   . Heart disease Mother      Social History Mr. Engdahl reports that he quit smoking about 7 months ago. His smoking use included Cigarettes. He started smoking about 58 years ago. He has a 57.00 pack-year smoking history. He has never used smokeless tobacco. Mr. Grieshop has no alcohol history on file.   Review of Systems CONSTITUTIONAL: No weight loss, fever, chills, weakness or fatigue.  HEENT: Eyes: No visual loss, blurred vision, double vision or yellow sclerae.No hearing loss, sneezing, congestion, runny nose or sore throat.  SKIN: No rash or itching.  CARDIOVASCULAR: per HPI RESPIRATORY: No shortness of breath, cough or sputum.  GASTROINTESTINAL: No anorexia, nausea, vomiting or diarrhea. No abdominal pain or blood.  GENITOURINARY: No burning on urination, no polyuria NEUROLOGICAL: No headache, dizziness, syncope, paralysis, ataxia, numbness or tingling in the extremities. No change in bowel or bladder control.  MUSCULOSKELETAL: No muscle, back pain, joint pain or stiffness.  LYMPHATICS: No enlarged nodes. No history of splenectomy.  PSYCHIATRIC: No history of depression or anxiety.  ENDOCRINOLOGIC: No reports of sweating, cold or heat intolerance. No polyuria or polydipsia.  Marland Kitchen   Physical Examination Vitals:   07/25/16 1047  BP: (!) 152/83  Pulse: 80   Vitals:   07/25/16 1047  Weight: 183 lb (83 kg)  Height: 5\' 8"  (1.727 m)    Gen: resting comfortably, no acute distress HEENT: no scleral icterus, pupils equal round and reactive, no palptable cervical adenopathy,  CV: RRR, no m/r/g, no jvd Resp: Clear to auscultation bilaterally GI: abdomen is soft, non-tender, non-distended, normal bowel sounds, no hepatosplenomegaly MSK: extremities are warm, no edema.  Skin: warm, no rash Neuro:  no focal deficits Psych: appropriate  affect   Diagnostic Studies  03/2015 echo Study Conclusions  - Procedure narrative: Transthoracic echocardiography for left ventricular function evaluation, for right ventricular function evaluation, and for assessment of valvular function. Image quality was adequate. The study was technically difficult, as a result of poor sound wave transmission. - Left ventricle: The cavity size was normal. There was mild concentric hypertrophy. Systolic function was normal. The estimated ejection fraction was in the range of 60% to 65%. Images were inadequate for LV wall motion assessment. However, no gross regional variation on available images including apical 4-chamber views. Doppler parameters are consistent with abnormal left ventricular relaxation (grade 1 diastolic dysfunction).   05/2015 MPI  Diffuse resting ST segment abnormalities.  Fixed inferior wall defect extending from apex to base with some inferoseptal involvement which is likely due to soft tissue attenuation artifact. No ischemic territories.  This is a low risk  study.  Nuclear stress EF: 55%.  06/2016 cath  Mid RCA lesion, 10 %stenosed.  Mid LAD lesion, 10 %stenosed.  Ost 2nd Diag to 2nd Diag lesion, 50 %stenosed.  Mid Cx to Dist Cx lesion, 10 %stenosed.  The left ventricular systolic function is normal.  LV end diastolic pressure is moderately elevated.  The left ventricular ejection fraction is 55-65% by visual estimate.  There is no aortic valve stenosis.  No pulmonary artery hypertension. PA sat 64%. CO 4.6 L/min. CI 2.3.   Mild volume overload.  Consider treatment with diuretic.    Smoking cessation recommended.  Shortness of breath may be related to lung issues as well. F/u with Dr. Harl Bowie.  Assessment and Plan  1. DOE/Acute on chronic diastolic HF - long history, extensive cardiac workup as described above. Rcent cath without significant CAD, did have elevated LVEDP - in  setting of HTN and elevated LVEDP will start chlorthalidone 12.5mg  daily, check BMET/Mg in 2 weeks  2. HTN - above goal, Starting chlorthalidone as described above   F/u 2 months       Arnoldo Lenis, M.D., F.A.C.C.

## 2016-07-25 NOTE — Patient Instructions (Signed)
Your physician recommends that you schedule a follow-up appointment in: 2 [place] has recommended you make the following change in your medication:   START CHLORTHALIDONE 12.5 MG DAILY (1/2 TABLET)   Your physician recommends that you return for lab work in: 2 WEEKS BMP/MG  Thank you for choosing [facility]!!

## 2016-08-01 DIAGNOSIS — Z299 Encounter for prophylactic measures, unspecified: Secondary | ICD-10-CM | POA: Diagnosis not present

## 2016-08-01 DIAGNOSIS — J449 Chronic obstructive pulmonary disease, unspecified: Secondary | ICD-10-CM | POA: Diagnosis not present

## 2016-08-01 DIAGNOSIS — F329 Major depressive disorder, single episode, unspecified: Secondary | ICD-10-CM | POA: Diagnosis not present

## 2016-08-01 DIAGNOSIS — K219 Gastro-esophageal reflux disease without esophagitis: Secondary | ICD-10-CM | POA: Diagnosis not present

## 2016-08-01 DIAGNOSIS — Z713 Dietary counseling and surveillance: Secondary | ICD-10-CM | POA: Diagnosis not present

## 2016-08-01 DIAGNOSIS — Z6827 Body mass index (BMI) 27.0-27.9, adult: Secondary | ICD-10-CM | POA: Diagnosis not present

## 2016-08-01 DIAGNOSIS — I10 Essential (primary) hypertension: Secondary | ICD-10-CM | POA: Diagnosis not present

## 2016-08-01 DIAGNOSIS — R0989 Other specified symptoms and signs involving the circulatory and respiratory systems: Secondary | ICD-10-CM | POA: Diagnosis not present

## 2016-08-08 DIAGNOSIS — R0609 Other forms of dyspnea: Secondary | ICD-10-CM | POA: Diagnosis not present

## 2016-08-23 DIAGNOSIS — J449 Chronic obstructive pulmonary disease, unspecified: Secondary | ICD-10-CM | POA: Diagnosis not present

## 2016-08-23 DIAGNOSIS — I10 Essential (primary) hypertension: Secondary | ICD-10-CM | POA: Diagnosis not present

## 2016-08-27 DIAGNOSIS — R21 Rash and other nonspecific skin eruption: Secondary | ICD-10-CM | POA: Diagnosis not present

## 2016-08-27 DIAGNOSIS — Z6827 Body mass index (BMI) 27.0-27.9, adult: Secondary | ICD-10-CM | POA: Diagnosis not present

## 2016-08-27 DIAGNOSIS — I10 Essential (primary) hypertension: Secondary | ICD-10-CM | POA: Diagnosis not present

## 2016-08-27 DIAGNOSIS — J449 Chronic obstructive pulmonary disease, unspecified: Secondary | ICD-10-CM | POA: Diagnosis not present

## 2016-08-27 DIAGNOSIS — Z87891 Personal history of nicotine dependence: Secondary | ICD-10-CM | POA: Diagnosis not present

## 2016-08-27 DIAGNOSIS — G4700 Insomnia, unspecified: Secondary | ICD-10-CM | POA: Diagnosis not present

## 2016-08-27 DIAGNOSIS — W57XXXA Bitten or stung by nonvenomous insect and other nonvenomous arthropods, initial encounter: Secondary | ICD-10-CM | POA: Diagnosis not present

## 2016-09-04 DIAGNOSIS — H40033 Anatomical narrow angle, bilateral: Secondary | ICD-10-CM | POA: Diagnosis not present

## 2016-09-04 DIAGNOSIS — H353131 Nonexudative age-related macular degeneration, bilateral, early dry stage: Secondary | ICD-10-CM | POA: Diagnosis not present

## 2016-09-04 DIAGNOSIS — H52209 Unspecified astigmatism, unspecified eye: Secondary | ICD-10-CM | POA: Diagnosis not present

## 2016-09-04 DIAGNOSIS — H2513 Age-related nuclear cataract, bilateral: Secondary | ICD-10-CM | POA: Diagnosis not present

## 2016-09-17 DIAGNOSIS — L710 Perioral dermatitis: Secondary | ICD-10-CM | POA: Diagnosis not present

## 2016-09-17 DIAGNOSIS — L570 Actinic keratosis: Secondary | ICD-10-CM | POA: Diagnosis not present

## 2016-09-17 DIAGNOSIS — D485 Neoplasm of uncertain behavior of skin: Secondary | ICD-10-CM | POA: Diagnosis not present

## 2016-09-21 DIAGNOSIS — I10 Essential (primary) hypertension: Secondary | ICD-10-CM | POA: Diagnosis not present

## 2016-09-21 DIAGNOSIS — K219 Gastro-esophageal reflux disease without esophagitis: Secondary | ICD-10-CM | POA: Diagnosis not present

## 2016-09-21 DIAGNOSIS — G4700 Insomnia, unspecified: Secondary | ICD-10-CM | POA: Diagnosis not present

## 2016-09-21 DIAGNOSIS — J449 Chronic obstructive pulmonary disease, unspecified: Secondary | ICD-10-CM | POA: Diagnosis not present

## 2016-09-21 DIAGNOSIS — Z299 Encounter for prophylactic measures, unspecified: Secondary | ICD-10-CM | POA: Diagnosis not present

## 2016-09-21 DIAGNOSIS — F329 Major depressive disorder, single episode, unspecified: Secondary | ICD-10-CM | POA: Diagnosis not present

## 2016-09-21 DIAGNOSIS — Z6820 Body mass index (BMI) 20.0-20.9, adult: Secondary | ICD-10-CM | POA: Diagnosis not present

## 2016-09-21 DIAGNOSIS — Z713 Dietary counseling and surveillance: Secondary | ICD-10-CM | POA: Diagnosis not present

## 2016-09-25 ENCOUNTER — Ambulatory Visit (INDEPENDENT_AMBULATORY_CARE_PROVIDER_SITE_OTHER): Payer: Medicare Other | Admitting: Cardiology

## 2016-09-25 ENCOUNTER — Encounter: Payer: Self-pay | Admitting: Cardiology

## 2016-09-25 ENCOUNTER — Telehealth: Payer: Self-pay | Admitting: Cardiology

## 2016-09-25 VITALS — BP 128/71 | HR 82 | Ht 68.0 in | Wt 184.2 lb

## 2016-09-25 DIAGNOSIS — I10 Essential (primary) hypertension: Secondary | ICD-10-CM

## 2016-09-25 DIAGNOSIS — J449 Chronic obstructive pulmonary disease, unspecified: Secondary | ICD-10-CM | POA: Diagnosis not present

## 2016-09-25 DIAGNOSIS — I5032 Chronic diastolic (congestive) heart failure: Secondary | ICD-10-CM

## 2016-09-25 DIAGNOSIS — Z136 Encounter for screening for cardiovascular disorders: Secondary | ICD-10-CM | POA: Diagnosis not present

## 2016-09-25 NOTE — Progress Notes (Signed)
Clinical Summary Dominic Willis is a 76 y.o.male seen today for follow up of the following medical problems.   1. DOE/Chest pain - on inhalers, prednisone,and abx recently. Symptoms not much improved - DOE with walking up one flight of stairs. DOE with using his weedeater at 15 minutes. Limited walking at inclines as well, has to stop and rest.  - can get chest pain at times. Pressure midchest that is variable in severity, Resolves with rest. Symptoms ongoing for several years. Some increase in frequency.  - completed a nuclear stress test that showed no clear ischemia, echo with LVEF 84-16%, grade I diastolic dysfunction.  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 Mean PA 23, LVEDP 24, CI 2.3   - last visit we started chlorthalidone 12.5mg  daily. SOB overall unchanged - completed pulmonary rehab   2. COPD - followed by Dr Woody Seller and Dr Lake Bells - PFTs 12/2015 very severe obstruction - wearing home O2  3. HTN - he is compliant with meds   Past Medical History:  Diagnosis Date  . Actinic keratosis   . Asthma   . COPD (chronic obstructive pulmonary disease) ([place])   . Hypertension   . Insomnia   . Seasonal allergies   . Tobacco abuse      Allergies  Allergen Reactions  . Brovana [Arformoterol] Shortness Of Breath  . Pulmicort [Budesonide] Shortness Of Breath  . Symbicort [Budesonide-Formoterol Fumarate] Other (See Comments)    headache     Current Outpatient Prescriptions  Medication Sig Dispense Refill  . Acetylcysteine (N-ACETYL-L-CYSTEINE PO) Take 500 mg by mouth 2 (two) times daily.    Marland Kitchen albuterol (PROVENTIL HFA;VENTOLIN HFA) 108 (90 Base) MCG/ACT inhaler Inhale 2-4 puffs into the lungs every 6 (six) hours as needed for wheezing or shortness of breath.    Marland Kitchen albuterol (PROVENTIL) (2.5 MG/3ML) 0.083% nebulizer solution Take 2.5 mg by nebulization every 4 (four) hours as needed for wheezing or shortness of breath.     . ALPRAZolam (XANAX) 1 MG tablet Take 1 mg by  mouth at bedtime.    Marland Kitchen aspirin EC 81 MG tablet Take 81 mg by mouth at bedtime.     Marland Kitchen b complex vitamins tablet Take 1 tablet by mouth daily.    . chlorthalidone (HYGROTON) 25 MG tablet Take 0.5 tablets (12.5 mg total) by mouth daily. 45 tablet 1  . Cholecalciferol (VITAMIN D3 PO) Take 1 capsule by mouth daily.    . Cyanocobalamin (B-12 SL) Place 1 tablet under the tongue daily.    Marland Kitchen doxycycline (VIBRA-TABS) 100 MG tablet Take 100 mg by mouth daily.    . Fluticasone-Salmeterol (ADVAIR HFA IN) Inhale 2 puffs into the lungs 2 (two) times daily.    Marland Kitchen loratadine-pseudoephedrine (CLARITIN-D 12-HOUR) 5-120 MG tablet Take 1 tablet by mouth 2 (two) times daily.    Marland Kitchen losartan (COZAAR) 100 MG tablet Take 100 mg by mouth daily.    . Multiple Vitamins-Minerals (VISION VITAMINS PO) Take 1 tablet by mouth 2 (two) times daily.    . naproxen sodium (ANAPROX) 220 MG tablet Take 440 mg by mouth as needed (pain).    . pantoprazole (PROTONIX) 40 MG tablet Take 40 mg by mouth daily.    Marland Kitchen tiotropium (SPIRIVA) 18 MCG inhalation capsule Place 18 mcg into inhaler and inhale daily.    . vitamin E 400 UNIT capsule Take 400 Units by mouth daily.     No current facility-administered medications for this visit.      Past Surgical  History:  Procedure Laterality Date  . BREAST BIOPSY Left   . INGUINAL HERNIA REPAIR    . LUMBAR LAMINECTOMY     Dr. Joya Salm  . RIGHT/LEFT HEART CATH AND CORONARY ANGIOGRAPHY N/A 07/10/2016   Procedure: Right/Left Heart Cath and Coronary Angiography;  Surgeon: Jettie Booze, MD;  Location: [facility];  Service: Cardiovascular;  Laterality: N/A;  . SHOULDER SURGERY Left      Allergies  Allergen Reactions  . Brovana [Arformoterol] Shortness Of Breath  . Pulmicort [Budesonide] Shortness Of Breath  . Symbicort [Budesonide-Formoterol Fumarate] Other (See Comments)    headache      Family History  Problem Relation Age of Onset  . Heart disease Father   . Heart disease  Mother      Social History Mr. Kinker reports that he has been smoking Cigarettes.  He started smoking about 58 years ago. He has a 57.00 pack-year smoking history. He has never used smokeless tobacco. Mr. Ligman has no alcohol history on file.   Review of Systems CONSTITUTIONAL: No weight loss, fever, chills, weakness or fatigue.  HEENT: Eyes: No visual loss, blurred vision, double vision or yellow sclerae.No hearing loss, sneezing, congestion, runny nose or sore throat.  SKIN: No rash or itching.  CARDIOVASCULAR: per HPI RESPIRATORY: per HPI GASTROINTESTINAL: No anorexia, nausea, vomiting or diarrhea. No abdominal pain or blood.  GENITOURINARY: No burning on urination, no polyuria NEUROLOGICAL: No headache, dizziness, syncope, paralysis, ataxia, numbness or tingling in the extremities. No change in bowel or bladder control.  MUSCULOSKELETAL: No muscle, back pain, joint pain or stiffness.  LYMPHATICS: No enlarged nodes. No history of splenectomy.  PSYCHIATRIC: No history of depression or anxiety.  ENDOCRINOLOGIC: No reports of sweating, cold or heat intolerance. No polyuria or polydipsia.  Marland Kitchen   Physical Examination Vitals:   09/25/16 1111  BP: 128/71  Pulse: 82   Vitals:   09/25/16 1111  Weight: 184 lb 3.2 oz (83.6 kg)  Height: 5\' 8"  (1.727 m)    Gen: resting comfortably, no acute distress HEENT: no scleral icterus, pupils equal round and reactive, no palptable cervical adenopathy,  CV: RRR, no mr/g, no jvd Resp: Clear to auscultation bilaterally GI: abdomen is soft, non-tender, non-distended, normal bowel sounds, no hepatosplenomegaly MSK: extremities are warm, no edema.  Skin: warm, no rash Neuro:  no focal deficits Psych: appropriate affect   Diagnostic Studies 03/2015 echo Study Conclusions  - Procedure narrative: Transthoracic echocardiography for left ventricular function evaluation, for right ventricular function evaluation, and for assessment of  valvular function. Image quality was adequate. The study was technically difficult, as a result of poor sound wave transmission. - Left ventricle: The cavity size was normal. There was mild concentric hypertrophy. Systolic function was normal. The estimated ejection fraction was in the range of 60% to 65%. Images were inadequate for LV wall motion assessment. However, no gross regional variation on available images including apical 4-chamber views. Doppler parameters are consistent with abnormal left ventricular relaxation (grade 1 diastolic dysfunction).   05/2015 MPI  Diffuse resting ST segment abnormalities.  Fixed inferior wall defect extending from apex to base with some inferoseptal involvement which is likely due to soft tissue attenuation artifact. No ischemic territories.  This is a low risk study.  Nuclear stress EF: 55%.  06/2016 cath  Mid RCA lesion, 10 %stenosed.  Mid LAD lesion, 10 %stenosed.  Ost 2nd Diag to 2nd Diag lesion, 50 %stenosed.  Mid Cx to Dist Cx lesion, 10 %stenosed.  The left ventricular systolic function is normal.  LV end diastolic pressure is moderately elevated.  The left ventricular ejection fraction is 55-65% by visual estimate.  There is no aortic valve stenosis.  No pulmonary artery hypertension. PA sat 64%. CO 4.6 L/min. CI 2.3.  Mild volume overload. Consider treatment with diuretic.   Smoking cessation recommended. Shortness of breath may be related to lung issues as well. F/u with Dr. Harl Bowie.    Assessment and Plan  1. Chronic diastolic HF - long history, extensive cardiac workup as described above. Rcent cath without significant CAD, did have elevated LVEDP - continue diuretics,repeat BMET/Mg  I think the primary etiology of his severe SOB and hypoxia is his severe COPD  2. HTN - at goal, continue current meds  3. AAA screen - male over 36 with tobacco history, order AAA screening US   F/u6   months       Arnoldo Lenis, M.D.

## 2016-09-25 NOTE — Telephone Encounter (Signed)
AAA [place] DX AAA SCREEN Scheduled October 16, 2016 In [place]

## 2016-09-25 NOTE — Patient Instructions (Signed)
Your physician wants you to follow-up in: [place] will receive a reminder letter in the mail two months in advance. If you don't receive a letter, please call our office to schedule the follow-up appointment.  Your physician recommends that you continue on your current medications as directed. Please refer to the Current Medication list given to you today.  Your physician recommends that you return for lab work BMP/MG  Your physician has requested that you have an abdominal aorta duplex. During this test, an ultrasound is used to evaluate the aorta. Allow 30 minutes for this exam. Do not eat after midnight the day before and avoid carbonated beverages  Thank you for choosing [facility]!!

## 2016-10-01 ENCOUNTER — Telehealth: Payer: Self-pay

## 2016-10-01 NOTE — Telephone Encounter (Signed)
-----   Message from Arnoldo Lenis, MD sent at 10/01/2016  3:39 PM EDT ----- Labs look good  Zandra Abts MD

## 2016-10-01 NOTE — Telephone Encounter (Signed)
Patient notified. Routed to PCP 

## 2016-10-03 DIAGNOSIS — R109 Unspecified abdominal pain: Secondary | ICD-10-CM | POA: Diagnosis not present

## 2016-10-03 DIAGNOSIS — I10 Essential (primary) hypertension: Secondary | ICD-10-CM | POA: Diagnosis not present

## 2016-10-03 DIAGNOSIS — R1013 Epigastric pain: Secondary | ICD-10-CM | POA: Diagnosis not present

## 2016-10-03 DIAGNOSIS — J449 Chronic obstructive pulmonary disease, unspecified: Secondary | ICD-10-CM | POA: Diagnosis not present

## 2016-10-03 DIAGNOSIS — Z299 Encounter for prophylactic measures, unspecified: Secondary | ICD-10-CM | POA: Diagnosis not present

## 2016-10-03 DIAGNOSIS — Z6828 Body mass index (BMI) 28.0-28.9, adult: Secondary | ICD-10-CM | POA: Diagnosis not present

## 2016-10-03 DIAGNOSIS — R0789 Other chest pain: Secondary | ICD-10-CM | POA: Diagnosis not present

## 2016-10-03 DIAGNOSIS — Z713 Dietary counseling and surveillance: Secondary | ICD-10-CM | POA: Diagnosis not present

## 2016-10-03 DIAGNOSIS — R5383 Other fatigue: Secondary | ICD-10-CM | POA: Diagnosis not present

## 2016-10-10 DIAGNOSIS — R5383 Other fatigue: Secondary | ICD-10-CM | POA: Diagnosis not present

## 2016-10-16 ENCOUNTER — Ambulatory Visit: Payer: Medicare Other

## 2016-10-16 DIAGNOSIS — Z136 Encounter for screening for cardiovascular disorders: Secondary | ICD-10-CM

## 2016-10-23 ENCOUNTER — Telehealth: Payer: Self-pay | Admitting: *Deleted

## 2016-10-23 NOTE — Telephone Encounter (Signed)
-----   Message from Arnoldo Lenis, MD sent at 10/22/2016 11:05 AM EDT ----- Normal [place], no evidence of aneurysm  Zandra Abts MD

## 2016-10-23 NOTE — Telephone Encounter (Signed)
Pt aware - routed to pcp  

## 2016-11-06 ENCOUNTER — Ambulatory Visit: Payer: Medicare Other | Admitting: Pulmonary Disease

## 2016-12-21 DIAGNOSIS — I10 Essential (primary) hypertension: Secondary | ICD-10-CM | POA: Diagnosis not present

## 2016-12-21 DIAGNOSIS — Z299 Encounter for prophylactic measures, unspecified: Secondary | ICD-10-CM | POA: Diagnosis not present

## 2016-12-21 DIAGNOSIS — F329 Major depressive disorder, single episode, unspecified: Secondary | ICD-10-CM | POA: Diagnosis not present

## 2016-12-21 DIAGNOSIS — Z6828 Body mass index (BMI) 28.0-28.9, adult: Secondary | ICD-10-CM | POA: Diagnosis not present

## 2016-12-21 DIAGNOSIS — J449 Chronic obstructive pulmonary disease, unspecified: Secondary | ICD-10-CM | POA: Diagnosis not present

## 2017-01-09 DIAGNOSIS — J449 Chronic obstructive pulmonary disease, unspecified: Secondary | ICD-10-CM | POA: Diagnosis not present

## 2017-01-09 DIAGNOSIS — I10 Essential (primary) hypertension: Secondary | ICD-10-CM | POA: Diagnosis not present

## 2017-01-09 DIAGNOSIS — Z299 Encounter for prophylactic measures, unspecified: Secondary | ICD-10-CM | POA: Diagnosis not present

## 2017-01-09 DIAGNOSIS — J069 Acute upper respiratory infection, unspecified: Secondary | ICD-10-CM | POA: Diagnosis not present

## 2017-01-09 DIAGNOSIS — F329 Major depressive disorder, single episode, unspecified: Secondary | ICD-10-CM | POA: Diagnosis not present

## 2017-01-09 DIAGNOSIS — Z6828 Body mass index (BMI) 28.0-28.9, adult: Secondary | ICD-10-CM | POA: Diagnosis not present

## 2017-01-18 DIAGNOSIS — I10 Essential (primary) hypertension: Secondary | ICD-10-CM | POA: Diagnosis not present

## 2017-01-18 DIAGNOSIS — J449 Chronic obstructive pulmonary disease, unspecified: Secondary | ICD-10-CM | POA: Diagnosis not present

## 2017-01-18 DIAGNOSIS — Z299 Encounter for prophylactic measures, unspecified: Secondary | ICD-10-CM | POA: Diagnosis not present

## 2017-01-18 DIAGNOSIS — Z713 Dietary counseling and surveillance: Secondary | ICD-10-CM | POA: Diagnosis not present

## 2017-01-18 DIAGNOSIS — Z6828 Body mass index (BMI) 28.0-28.9, adult: Secondary | ICD-10-CM | POA: Diagnosis not present

## 2017-02-12 DIAGNOSIS — Z23 Encounter for immunization: Secondary | ICD-10-CM | POA: Diagnosis not present

## 2017-02-14 DIAGNOSIS — I10 Essential (primary) hypertension: Secondary | ICD-10-CM | POA: Diagnosis not present

## 2017-02-14 DIAGNOSIS — J449 Chronic obstructive pulmonary disease, unspecified: Secondary | ICD-10-CM | POA: Diagnosis not present

## 2017-03-19 DIAGNOSIS — Z1211 Encounter for screening for malignant neoplasm of colon: Secondary | ICD-10-CM | POA: Diagnosis not present

## 2017-03-19 DIAGNOSIS — R5383 Other fatigue: Secondary | ICD-10-CM | POA: Diagnosis not present

## 2017-03-19 DIAGNOSIS — I10 Essential (primary) hypertension: Secondary | ICD-10-CM | POA: Diagnosis not present

## 2017-03-19 DIAGNOSIS — G4700 Insomnia, unspecified: Secondary | ICD-10-CM | POA: Diagnosis not present

## 2017-03-19 DIAGNOSIS — Z299 Encounter for prophylactic measures, unspecified: Secondary | ICD-10-CM | POA: Diagnosis not present

## 2017-03-19 DIAGNOSIS — Z6828 Body mass index (BMI) 28.0-28.9, adult: Secondary | ICD-10-CM | POA: Diagnosis not present

## 2017-03-19 DIAGNOSIS — Z1331 Encounter for screening for depression: Secondary | ICD-10-CM | POA: Diagnosis not present

## 2017-03-19 DIAGNOSIS — Z7189 Other specified counseling: Secondary | ICD-10-CM | POA: Diagnosis not present

## 2017-03-19 DIAGNOSIS — J449 Chronic obstructive pulmonary disease, unspecified: Secondary | ICD-10-CM | POA: Diagnosis not present

## 2017-03-19 DIAGNOSIS — Z125 Encounter for screening for malignant neoplasm of prostate: Secondary | ICD-10-CM | POA: Diagnosis not present

## 2017-03-19 DIAGNOSIS — Z0000 Encounter for general adult medical examination without abnormal findings: Secondary | ICD-10-CM | POA: Diagnosis not present

## 2017-03-19 DIAGNOSIS — Z1339 Encounter for screening examination for other mental health and behavioral disorders: Secondary | ICD-10-CM | POA: Diagnosis not present

## 2017-03-19 DIAGNOSIS — Z79899 Other long term (current) drug therapy: Secondary | ICD-10-CM | POA: Diagnosis not present

## 2017-03-22 ENCOUNTER — Encounter: Payer: Self-pay | Admitting: Cardiology

## 2017-03-22 ENCOUNTER — Ambulatory Visit (INDEPENDENT_AMBULATORY_CARE_PROVIDER_SITE_OTHER): Payer: Medicare Other | Admitting: Cardiology

## 2017-03-22 VITALS — BP 148/84 | HR 76 | Ht 68.5 in | Wt 184.0 lb

## 2017-03-22 DIAGNOSIS — I5032 Chronic diastolic (congestive) heart failure: Secondary | ICD-10-CM

## 2017-03-22 DIAGNOSIS — R002 Palpitations: Secondary | ICD-10-CM

## 2017-03-22 DIAGNOSIS — I10 Essential (primary) hypertension: Secondary | ICD-10-CM | POA: Diagnosis not present

## 2017-03-22 MED ORDER — CHLORTHALIDONE 25 MG PO TABS
25.0000 mg | ORAL_TABLET | Freq: Every day | ORAL | 1 refills | Status: DC
Start: 2017-03-22 — End: 2017-12-17

## 2017-03-22 NOTE — Progress Notes (Signed)
Clinical Summary Mr. Coffield is a 76 y.o.male seen today for follow up of the following medical problems.   1. DOE/Chest pain - on inhalers, prednisone,and abx recently. Symptoms not much improved - DOE with walking up one flight of stairs. DOE with using his weedeater at 15 minutes. Limited walking at inclines as well, has to stop and rest.  - can get chest pain at times. Pressure midchest that is variable in severity, Resolves with rest. Symptoms ongoing for several years. Some increase in frequency.  - completed a nuclear stress test that showed no clear ischemia, echo with LVEF 54-27%, grade I diastolic dysfunction.  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 Mean PA 23, LVEDP 24, CI 2.3  - completed pulmonary rehab - on home O2 24 hrs a day 2 L - can have some LE edema in legs. Home weights stable 182-184 lbs.  - notices high heart rates with exertion at home.    2. COPD - followed by Dr Woody Seller and Dr Lake Bells - PFTs 12/2015 very severe obstruction - on home O2  3. HTN - compliant with bp meds   4. Palpitations - occasional palpitations. Does note high heart rates at home, typically with exertion, up to 130s.  Past Medical History:  Diagnosis Date  . Actinic keratosis   . Asthma   . COPD (chronic obstructive pulmonary disease) ([place])   . Hypertension   . Insomnia   . Seasonal allergies   . Tobacco abuse      Allergies  Allergen Reactions  . Brovana [Arformoterol] Shortness Of Breath  . Pulmicort [Budesonide] Shortness Of Breath  . Symbicort [Budesonide-Formoterol Fumarate] Other (See Comments)    headache     Current Outpatient Medications  Medication Sig Dispense Refill  . albuterol (PROVENTIL HFA;VENTOLIN HFA) 108 (90 Base) MCG/ACT inhaler Inhale 2-4 puffs into the lungs every 6 (six) hours as needed for wheezing or shortness of breath.    Marland Kitchen albuterol (PROVENTIL) (2.5 MG/3ML) 0.083% nebulizer solution Take 2.5 mg by nebulization every 4 (four) hours as  needed for wheezing or shortness of breath.     . ALPRAZolam (XANAX) 1 MG tablet Take 1 mg by mouth at bedtime. PATIENT TAKES 1/2 TABLET IN THE MORNING AND 1 TABLET AT BEDTIME    . aspirin EC 81 MG tablet Take 81 mg by mouth at bedtime.     Marland Kitchen b complex vitamins tablet Take 1 tablet by mouth daily.    . chlorthalidone (HYGROTON) 25 MG tablet Take 0.5 tablets (12.5 mg total) by mouth daily. 45 tablet 1  . Cholecalciferol (VITAMIN D3 PO) Take 1 capsule by mouth daily.    . Cyanocobalamin (B-12 SL) Place 1 tablet under the tongue daily.    Marland Kitchen doxycycline (VIBRA-TABS) 100 MG tablet Take 100 mg by mouth daily.    . Fluticasone-Salmeterol (ADVAIR HFA IN) Inhale 2 puffs into the lungs 2 (two) times daily.    Marland Kitchen loratadine-pseudoephedrine (CLARITIN-D 12-HOUR) 5-120 MG tablet Take 1 tablet by mouth 2 (two) times daily.    Marland Kitchen losartan (COZAAR) 100 MG tablet Take 100 mg by mouth daily.    . Multiple Vitamins-Minerals (VISION VITAMINS PO) Take 1 tablet by mouth 2 (two) times daily.    . naproxen sodium (ANAPROX) 220 MG tablet Take 440 mg by mouth as needed (pain).    . pantoprazole (PROTONIX) 40 MG tablet Take 40 mg by mouth daily.    Marland Kitchen tiotropium (SPIRIVA) 18 MCG inhalation capsule Place 18 mcg into inhaler  and inhale daily.    . vitamin E 400 UNIT capsule Take 400 Units by mouth daily.     No current facility-administered medications for this visit.      Past Surgical History:  Procedure Laterality Date  . BREAST BIOPSY Left   . INGUINAL HERNIA REPAIR    . LUMBAR LAMINECTOMY     Dr. Joya Salm  . RIGHT/LEFT HEART CATH AND CORONARY ANGIOGRAPHY N/A 07/10/2016   Procedure: Right/Left Heart Cath and Coronary Angiography;  Surgeon: Jettie Booze, MD;  Location: [facility];  Service: Cardiovascular;  Laterality: N/A;  . SHOULDER SURGERY Left      Allergies  Allergen Reactions  . Brovana [Arformoterol] Shortness Of Breath  . Pulmicort [Budesonide] Shortness Of Breath  . Symbicort  [Budesonide-Formoterol Fumarate] Other (See Comments)    headache      Family History  Problem Relation Age of Onset  . Heart disease Father   . Heart disease Mother      Social History Mr. Straus reports that he quit smoking about 15 months ago. His smoking use included cigarettes. He started smoking about 59 years ago. He has a 57.00 pack-year smoking history. he has never used smokeless tobacco. Mr. Gullion has no alcohol history on file.   Review of Systems CONSTITUTIONAL: No weight loss, fever, chills, weakness or fatigue.  HEENT: Eyes: No visual loss, blurred vision, double vision or yellow sclerae.No hearing loss, sneezing, congestion, runny nose or sore throat.  SKIN: No rash or itching.  CARDIOVASCULAR: per hpi RESPIRATORY: per hpi GASTROINTESTINAL: No anorexia, nausea, vomiting or diarrhea. No abdominal pain or blood.  GENITOURINARY: No burning on urination, no polyuria NEUROLOGICAL: No headache, dizziness, syncope, paralysis, ataxia, numbness or tingling in the extremities. No change in bowel or bladder control.  MUSCULOSKELETAL: No muscle, back pain, joint pain or stiffness.  LYMPHATICS: No enlarged nodes. No history of splenectomy.  PSYCHIATRIC: No history of depression or anxiety.  ENDOCRINOLOGIC: No reports of sweating, cold or heat intolerance. No polyuria or polydipsia.  Marland Kitchen   Physical Examination Vitals:   03/22/17 1001  BP: (!) 148/84  Pulse: 76  SpO2: 90%   Vitals:   03/22/17 1001  Weight: 184 lb (83.5 kg)  Height: 5' 8.5" (1.74 m)    Gen: resting comfortably, no acute distress HEENT: no scleral icterus, pupils equal round and reactive, no palptable cervical adenopathy,  CV: RRR, no m/r/g, no jvd Resp: Clear to auscultation bilaterally GI: abdomen is soft, non-tender, non-distended, normal bowel sounds, no hepatosplenomegaly MSK: extremities are warm, no edema.  Skin: warm, no rash Neuro:  no focal deficits Psych: appropriate  affect   Diagnostic Studies 03/2015 echo Study Conclusions  - Procedure narrative: Transthoracic echocardiography for left ventricular function evaluation, for right ventricular function evaluation, and for assessment of valvular function. Image quality was adequate. The study was technically difficult, as a result of poor sound wave transmission. - Left ventricle: The cavity size was normal. There was mild concentric hypertrophy. Systolic function was normal. The estimated ejection fraction was in the range of 60% to 65%. Images were inadequate for LV wall motion assessment. However, no gross regional variation on available images including apical 4-chamber views. Doppler parameters are consistent with abnormal left ventricular relaxation (grade 1 diastolic dysfunction).   05/2015 MPI  Diffuse resting ST segment abnormalities.  Fixed inferior wall defect extending from apex to base with some inferoseptal involvement which is likely due to soft tissue attenuation artifact. No ischemic territories.  This is  a low risk study.  Nuclear stress EF: 55%.  06/2016 cath  Mid RCA lesion, 10 %stenosed.  Mid LAD lesion, 10 %stenosed.  Ost 2nd Diag to 2nd Diag lesion, 50 %stenosed.  Mid Cx to Dist Cx lesion, 10 %stenosed.  The left ventricular systolic function is normal.  LV end diastolic pressure is moderately elevated.  The left ventricular ejection fraction is 55-65% by visual estimate.  There is no aortic valve stenosis.  No pulmonary artery hypertension. PA sat 64%. CO 4.6 L/min. CI 2.3.  Mild volume overload. Consider treatment with diuretic.   Smoking cessation recommended. Shortness of breath may be related to lung issues as well. F/u with Dr. Harl Bowie.   10/2016 AAA [place] Screen No aneurysm  Assessment and Plan  1. Chronic diastolic HF - long history, extensive cardiac workup as described above. Rcent cath without significant CAD, did  have elevated LVEDP - continue diuretics,we will try increasing chlorthalidone to 25mg  daily to see if additoinal diuresis will help his SOB, though primary issue looks to be pulmonary - check BMET/Mg in 2 weeks  2. HTN - above goal, increase chlorthalidione to 25mg  daily.   3. Tachycardia/Palpitations - ekg today shows NSR - obtain 7 day event monitor.     F/u 1  month      Arnoldo Lenis, M.D.

## 2017-03-22 NOTE — Patient Instructions (Signed)
Your physician recommends that you schedule a follow-up appointment in: [place] has recommended you make the following change in your medication:   INCREASE CHLORTHALIDONE 25 MG DAILY  Your physician recommends that you return for lab work in: [place] BMP/MG  Your physician has recommended that you wear an event monitor IN 1 WEEK. Event monitors are medical devices that record the heart's electrical activity. Doctors most often [place] these monitors to diagnose arrhythmias. Arrhythmias are problems with the speed or rhythm of the heartbeat. The monitor is a small, portable device. You can wear one while you do your normal daily activities. This is usually used to diagnose what is causing palpitations/syncope (passing out).  Thank you for choosing [facility]!!

## 2017-03-26 ENCOUNTER — Encounter: Payer: Self-pay | Admitting: Cardiology

## 2017-03-28 DIAGNOSIS — R002 Palpitations: Secondary | ICD-10-CM | POA: Diagnosis not present

## 2017-04-04 DIAGNOSIS — I5033 Acute on chronic diastolic (congestive) heart failure: Secondary | ICD-10-CM | POA: Diagnosis not present

## 2017-04-11 IMAGING — NM NM MYOCAR MULTI W/SPECT W/WALL MOTION & EF
2 series · 12 of 12 positions shown · non-contrast
Comparison: none

[Series 1: rest · 8.28mm/px · 6 of 64 frames shown]
[frame 6/64]
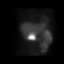
[frame 16/64]
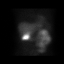
[frame 27/64]
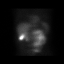
[frame 38/64]
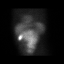
[frame 48/64]
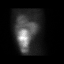
[frame 59/64]
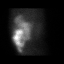

[Series 2: stress gated · 8.28mm/px · 6 of 64 frames shown]
[frame 6/64]
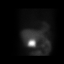
[frame 16/64]
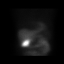
[frame 27/64]
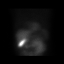
[frame 38/64]
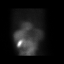
[frame 48/64]
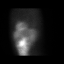
[frame 59/64]
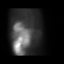

[12 of 12 positions shown; findings below may reference images not displayed]

Canned report from images found in remote index.

Refer to host system for actual result text.

## 2017-04-12 ENCOUNTER — Telehealth: Payer: Self-pay | Admitting: *Deleted

## 2017-04-12 DIAGNOSIS — J449 Chronic obstructive pulmonary disease, unspecified: Secondary | ICD-10-CM | POA: Diagnosis not present

## 2017-04-12 DIAGNOSIS — I10 Essential (primary) hypertension: Secondary | ICD-10-CM | POA: Diagnosis not present

## 2017-04-12 NOTE — Telephone Encounter (Signed)
-----   Message from Arnoldo Lenis, MD sent at 04/12/2017  3:48 PM EST ----- Labs look fine  J BrancH MD

## 2017-04-12 NOTE — Telephone Encounter (Signed)
Pt aware - routed to pcp  

## 2017-04-22 DIAGNOSIS — L710 Perioral dermatitis: Secondary | ICD-10-CM | POA: Diagnosis not present

## 2017-04-30 DIAGNOSIS — I10 Essential (primary) hypertension: Secondary | ICD-10-CM | POA: Diagnosis not present

## 2017-04-30 DIAGNOSIS — J449 Chronic obstructive pulmonary disease, unspecified: Secondary | ICD-10-CM | POA: Diagnosis not present

## 2017-05-02 ENCOUNTER — Ambulatory Visit: Payer: Medicare Other | Admitting: Cardiology

## 2017-05-23 DIAGNOSIS — I10 Essential (primary) hypertension: Secondary | ICD-10-CM | POA: Diagnosis not present

## 2017-05-23 DIAGNOSIS — D128 Benign neoplasm of rectum: Secondary | ICD-10-CM | POA: Diagnosis not present

## 2017-05-23 DIAGNOSIS — K219 Gastro-esophageal reflux disease without esophagitis: Secondary | ICD-10-CM | POA: Diagnosis not present

## 2017-05-23 DIAGNOSIS — D122 Benign neoplasm of ascending colon: Secondary | ICD-10-CM | POA: Diagnosis not present

## 2017-05-23 DIAGNOSIS — M1990 Unspecified osteoarthritis, unspecified site: Secondary | ICD-10-CM | POA: Diagnosis not present

## 2017-05-23 DIAGNOSIS — D126 Benign neoplasm of colon, unspecified: Secondary | ICD-10-CM | POA: Diagnosis not present

## 2017-05-23 DIAGNOSIS — Z1211 Encounter for screening for malignant neoplasm of colon: Secondary | ICD-10-CM | POA: Diagnosis not present

## 2017-05-23 DIAGNOSIS — R195 Other fecal abnormalities: Secondary | ICD-10-CM | POA: Diagnosis not present

## 2017-05-23 DIAGNOSIS — I2510 Atherosclerotic heart disease of native coronary artery without angina pectoris: Secondary | ICD-10-CM | POA: Diagnosis not present

## 2017-05-23 DIAGNOSIS — J449 Chronic obstructive pulmonary disease, unspecified: Secondary | ICD-10-CM | POA: Diagnosis not present

## 2017-05-23 DIAGNOSIS — Z888 Allergy status to other drugs, medicaments and biological substances status: Secondary | ICD-10-CM | POA: Diagnosis not present

## 2017-06-04 ENCOUNTER — Ambulatory Visit (INDEPENDENT_AMBULATORY_CARE_PROVIDER_SITE_OTHER): Payer: Medicare Other | Admitting: Cardiology

## 2017-06-04 ENCOUNTER — Encounter: Payer: Self-pay | Admitting: Cardiology

## 2017-06-04 VITALS — BP 118/78 | HR 81 | Ht 68.5 in | Wt 186.8 lb

## 2017-06-04 DIAGNOSIS — I5032 Chronic diastolic (congestive) heart failure: Secondary | ICD-10-CM | POA: Diagnosis not present

## 2017-06-04 DIAGNOSIS — R0602 Shortness of breath: Secondary | ICD-10-CM | POA: Diagnosis not present

## 2017-06-04 DIAGNOSIS — I10 Essential (primary) hypertension: Secondary | ICD-10-CM

## 2017-06-04 NOTE — Progress Notes (Signed)
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   1. DOE/Chest pain - on inhalers, prednisone,and abx recently. Symptoms not much improved - DOE with walking up one flight of stairs. DOE with using his weedeater at 15 minutes. Limited walking at inclines as well, has to stop and rest.  - can get chest pain at times. Pressure midchest that is variable in severity, Resolves with rest. Symptoms ongoing for several years. Some increase in frequency.  - completed a nuclear stress test that showed no clear ischemia, echo with LVEF 92-11%, grade I diastolic dysfunction.  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 Mean PA 23, LVEDP 24, CI 2.3  - completed pulmonary rehab - on home O2 24 hrs a day 2 L  - remains SOB, can be variable in severity - occasinal leg swelling that comes and goes.   2. COPD - followed by Dr Woody Seller and Dr Lake Bells - PFTs 12/2015 very severe obstruction - onhome O2  - uses prn albuterol, takes trelegy daily.   3. HTN - last visit increased chlorthalidone to 25mg  daily. Tolerating without side effects  4. Palpitations - rare since last visit. Primarily notices high heart rates with exertion.   Past Medical History:  Diagnosis Date  . Actinic keratosis   . Asthma   . COPD (chronic obstructive pulmonary disease) ([place])   . Hypertension   . Insomnia   . Seasonal allergies   . Tobacco abuse      Allergies  Allergen Reactions  . Brovana [Arformoterol] Shortness Of Breath  . Pulmicort [Budesonide] Shortness Of Breath  . Symbicort [Budesonide-Formoterol Fumarate] Other (See Comments)    headache     Current Outpatient Medications  Medication Sig Dispense Refill  . albuterol (PROVENTIL HFA;VENTOLIN HFA) 108 (90 Base) MCG/ACT inhaler Inhale 2-4 puffs into the lungs every 6 (six) hours as needed for wheezing or shortness of breath.    Marland Kitchen albuterol (PROVENTIL) (2.5 MG/3ML) 0.083% nebulizer  solution Take 2.5 mg by nebulization every 4 (four) hours as needed for wheezing or shortness of breath.     . ALPRAZolam (XANAX) 1 MG tablet Take 1 mg by mouth at bedtime. PATIENT TAKES 1/2 TABLET IN THE MORNING AND 1 TABLET AT BEDTIME    . aspirin EC 81 MG tablet Take 81 mg by mouth at bedtime.     Marland Kitchen b complex vitamins tablet Take 1 tablet by mouth daily.    . chlorthalidone (HYGROTON) 25 MG tablet Take 1 tablet (25 mg total) by mouth daily. 90 tablet 1  . Cholecalciferol (VITAMIN D3 PO) Take 1 capsule by mouth daily.    . Cyanocobalamin (B-12 SL) Place 1 tablet under the tongue daily.    Marland Kitchen doxycycline (VIBRA-TABS) 100 MG tablet Take 100 mg by mouth daily.    . Fluticasone-Umeclidin-Vilant (TRELEGY ELLIPTA IN) Inhale into the lungs.    Marland Kitchen loratadine-pseudoephedrine (CLARITIN-D 12-HOUR) 5-120 MG tablet Take 1 tablet by mouth 2 (two) times daily.    Marland Kitchen losartan (COZAAR) 100 MG tablet Take 100 mg by mouth daily.    . Multiple Vitamins-Minerals (VISION VITAMINS PO) Take 1 tablet by mouth 2 (two) times daily.    . naproxen sodium (ANAPROX) 220 MG tablet Take 440 mg by mouth as needed (pain).    . pantoprazole (PROTONIX) 40 MG tablet Take 40 mg by mouth daily.    . vitamin E 400 UNIT capsule Take 400 Units by mouth daily.  No current facility-administered medications for this visit.      Past Surgical History:  Procedure Laterality Date  . BREAST BIOPSY Left   . INGUINAL HERNIA REPAIR    . LUMBAR LAMINECTOMY     Dr. Joya Salm  . RIGHT/LEFT HEART CATH AND CORONARY ANGIOGRAPHY N/A 07/10/2016   Procedure: Right/Left Heart Cath and Coronary Angiography;  Surgeon: Jettie Booze, MD;  Location: [facility];  Service: Cardiovascular;  Laterality: N/A;  . SHOULDER SURGERY Left      Allergies  Allergen Reactions  . Brovana [Arformoterol] Shortness Of Breath  . Pulmicort [Budesonide] Shortness Of Breath  . Symbicort [Budesonide-Formoterol Fumarate] Other (See Comments)    headache       Family History  Problem Relation Age of Onset  . Heart disease Father   . Heart disease Mother      Social History Mr. Armon reports that he quit smoking about 17 months ago. His smoking use included cigarettes. He started smoking about 59 years ago. He has a 57.00 pack-year smoking history. he has never used smokeless tobacco. Mr. Tisdale has no alcohol history on file.   Review of Systems CONSTITUTIONAL: No weight loss, fever, chills, weakness or fatigue.  HEENT: Eyes: No visual loss, blurred vision, double vision or yellow sclerae.No hearing loss, sneezing, congestion, runny nose or sore throat.  SKIN: No rash or itching.  CARDIOVASCULAR: per hpi RESPIRATORY: per hpi  GASTROINTESTINAL: No anorexia, nausea, vomiting or diarrhea. No abdominal pain or blood.  GENITOURINARY: No burning on urination, no polyuria NEUROLOGICAL: No headache, dizziness, syncope, paralysis, ataxia, numbness or tingling in the extremities. No change in bowel or bladder control.  MUSCULOSKELETAL: No muscle, back pain, joint pain or stiffness.  LYMPHATICS: No enlarged nodes. No history of splenectomy.  PSYCHIATRIC: No history of depression or anxiety.  ENDOCRINOLOGIC: No reports of sweating, cold or heat intolerance. No polyuria or polydipsia.  Marland Kitchen   Physical Examination Vitals:   06/04/17 1042  BP: 118/78  Pulse: 81  SpO2: 95%   Vitals:   06/04/17 1042  Weight: 186 lb 12.8 oz (84.7 kg)  Height: 5' 8.5" (1.74 m)    Gen: resting comfortably, no acute distress HEENT: no scleral icterus, pupils equal round and reactive, no palptable cervical adenopathy,  CV: RRR, no m/r/g, no jvd Resp: +wheezing bilateral GI: abdomen is soft, non-tender, non-distended, normal bowel sounds, no hepatosplenomegaly MSK: extremities are warm, no edema.  Skin: warm, no rash Neuro:  no focal deficits Psych: appropriate affect   Diagnostic Studies 03/2015 echo Study Conclusions  - Procedure narrative:  Transthoracic echocardiography for left ventricular function evaluation, for right ventricular function evaluation, and for assessment of valvular function. Image quality was adequate. The study was technically difficult, as a result of poor sound wave transmission. - Left ventricle: The cavity size was normal. There was mild concentric hypertrophy. Systolic function was normal. The estimated ejection fraction was in the range of 60% to 65%. Images were inadequate for LV wall motion assessment. However, no gross regional variation on available images including apical 4-chamber views. Doppler parameters are consistent with abnormal left ventricular relaxation (grade 1 diastolic dysfunction).   05/2015 MPI  Diffuse resting ST segment abnormalities.  Fixed inferior wall defect extending from apex to base with some inferoseptal involvement which is likely due to soft tissue attenuation artifact. No ischemic territories.  This is a low risk study.  Nuclear stress EF: 55%.  06/2016 cath  Mid RCA lesion, 10 %stenosed.  Mid LAD lesion,  10 %stenosed.  Ost 2nd Diag to 2nd Diag lesion, 50 %stenosed.  Mid Cx to Dist Cx lesion, 10 %stenosed.  The left ventricular systolic function is normal.  LV end diastolic pressure is moderately elevated.  The left ventricular ejection fraction is 55-65% by visual estimate.  There is no aortic valve stenosis.  No pulmonary artery hypertension. PA sat 64%. CO 4.6 L/min. CI 2.3.  Mild volume overload. Consider treatment with diuretic.   Smoking cessation recommended. Shortness of breath may be related to lung issues as well. F/u with Dr. Harl Bowie.   10/2016 AAA [place] Screen No aneurysm    Assessment and Plan  1.Chronic diastolic HF - appears euvolemic, continue current meds  2. HTN - at goal, continue curren tmeds  3. Tachycardia/Palpitations - prior event monitor was benign - rates elevated only with  exertion, I suspect physiological response to his severe COPD and chronic hypoxia. No evidence of arrhythmia  4. SOB - appears to be due to his COPD. From cardiac standpoint extenstive cardiac testing fairly benign - continue to follow with pulmonary.    F/u 6 months   Arnoldo Lenis, M.D.

## 2017-06-04 NOTE — Patient Instructions (Signed)

## 2017-06-05 ENCOUNTER — Encounter: Payer: Self-pay | Admitting: Cardiology

## 2017-06-10 DIAGNOSIS — K635 Polyp of colon: Secondary | ICD-10-CM | POA: Diagnosis not present

## 2017-06-10 DIAGNOSIS — Z6827 Body mass index (BMI) 27.0-27.9, adult: Secondary | ICD-10-CM | POA: Diagnosis not present

## 2017-06-17 DIAGNOSIS — J449 Chronic obstructive pulmonary disease, unspecified: Secondary | ICD-10-CM | POA: Diagnosis not present

## 2017-06-17 DIAGNOSIS — J309 Allergic rhinitis, unspecified: Secondary | ICD-10-CM | POA: Diagnosis not present

## 2017-06-17 DIAGNOSIS — H6982 Other specified disorders of Eustachian tube, left ear: Secondary | ICD-10-CM | POA: Diagnosis not present

## 2017-06-17 DIAGNOSIS — F329 Major depressive disorder, single episode, unspecified: Secondary | ICD-10-CM | POA: Diagnosis not present

## 2017-06-17 DIAGNOSIS — Z299 Encounter for prophylactic measures, unspecified: Secondary | ICD-10-CM | POA: Diagnosis not present

## 2017-06-17 DIAGNOSIS — I10 Essential (primary) hypertension: Secondary | ICD-10-CM | POA: Diagnosis not present

## 2017-06-17 DIAGNOSIS — Z6829 Body mass index (BMI) 29.0-29.9, adult: Secondary | ICD-10-CM | POA: Diagnosis not present

## 2017-06-17 DIAGNOSIS — R0789 Other chest pain: Secondary | ICD-10-CM | POA: Diagnosis not present

## 2017-07-11 DIAGNOSIS — J449 Chronic obstructive pulmonary disease, unspecified: Secondary | ICD-10-CM | POA: Diagnosis not present

## 2017-07-11 DIAGNOSIS — I10 Essential (primary) hypertension: Secondary | ICD-10-CM | POA: Diagnosis not present

## 2017-07-12 DIAGNOSIS — R062 Wheezing: Secondary | ICD-10-CM | POA: Diagnosis not present

## 2017-07-18 DIAGNOSIS — Z299 Encounter for prophylactic measures, unspecified: Secondary | ICD-10-CM | POA: Diagnosis not present

## 2017-07-18 DIAGNOSIS — J449 Chronic obstructive pulmonary disease, unspecified: Secondary | ICD-10-CM | POA: Diagnosis not present

## 2017-07-18 DIAGNOSIS — J440 Chronic obstructive pulmonary disease with acute lower respiratory infection: Secondary | ICD-10-CM | POA: Diagnosis not present

## 2017-07-18 DIAGNOSIS — Z713 Dietary counseling and surveillance: Secondary | ICD-10-CM | POA: Diagnosis not present

## 2017-07-18 DIAGNOSIS — J209 Acute bronchitis, unspecified: Secondary | ICD-10-CM | POA: Diagnosis not present

## 2017-07-18 DIAGNOSIS — Z6827 Body mass index (BMI) 27.0-27.9, adult: Secondary | ICD-10-CM | POA: Diagnosis not present

## 2017-07-26 DIAGNOSIS — Z713 Dietary counseling and surveillance: Secondary | ICD-10-CM | POA: Diagnosis not present

## 2017-07-26 DIAGNOSIS — J449 Chronic obstructive pulmonary disease, unspecified: Secondary | ICD-10-CM | POA: Diagnosis not present

## 2017-07-26 DIAGNOSIS — I10 Essential (primary) hypertension: Secondary | ICD-10-CM | POA: Diagnosis not present

## 2017-07-26 DIAGNOSIS — Z6827 Body mass index (BMI) 27.0-27.9, adult: Secondary | ICD-10-CM | POA: Diagnosis not present

## 2017-07-26 DIAGNOSIS — Z299 Encounter for prophylactic measures, unspecified: Secondary | ICD-10-CM | POA: Diagnosis not present

## 2017-08-05 DIAGNOSIS — I10 Essential (primary) hypertension: Secondary | ICD-10-CM | POA: Diagnosis not present

## 2017-08-05 DIAGNOSIS — J449 Chronic obstructive pulmonary disease, unspecified: Secondary | ICD-10-CM | POA: Diagnosis not present

## 2017-08-27 DIAGNOSIS — J449 Chronic obstructive pulmonary disease, unspecified: Secondary | ICD-10-CM | POA: Diagnosis not present

## 2017-08-27 DIAGNOSIS — I10 Essential (primary) hypertension: Secondary | ICD-10-CM | POA: Diagnosis not present

## 2017-09-20 DIAGNOSIS — I10 Essential (primary) hypertension: Secondary | ICD-10-CM | POA: Diagnosis not present

## 2017-09-20 DIAGNOSIS — J449 Chronic obstructive pulmonary disease, unspecified: Secondary | ICD-10-CM | POA: Diagnosis not present

## 2017-09-23 DIAGNOSIS — Z6825 Body mass index (BMI) 25.0-25.9, adult: Secondary | ICD-10-CM | POA: Diagnosis not present

## 2017-09-23 DIAGNOSIS — Z713 Dietary counseling and surveillance: Secondary | ICD-10-CM | POA: Diagnosis not present

## 2017-09-23 DIAGNOSIS — I10 Essential (primary) hypertension: Secondary | ICD-10-CM | POA: Diagnosis not present

## 2017-09-23 DIAGNOSIS — J441 Chronic obstructive pulmonary disease with (acute) exacerbation: Secondary | ICD-10-CM | POA: Diagnosis not present

## 2017-09-23 DIAGNOSIS — Z299 Encounter for prophylactic measures, unspecified: Secondary | ICD-10-CM | POA: Diagnosis not present

## 2017-10-14 DIAGNOSIS — Z6827 Body mass index (BMI) 27.0-27.9, adult: Secondary | ICD-10-CM | POA: Diagnosis not present

## 2017-10-14 DIAGNOSIS — Z299 Encounter for prophylactic measures, unspecified: Secondary | ICD-10-CM | POA: Diagnosis not present

## 2017-10-14 DIAGNOSIS — J449 Chronic obstructive pulmonary disease, unspecified: Secondary | ICD-10-CM | POA: Diagnosis not present

## 2017-10-14 DIAGNOSIS — K219 Gastro-esophageal reflux disease without esophagitis: Secondary | ICD-10-CM | POA: Diagnosis not present

## 2017-10-14 DIAGNOSIS — L309 Dermatitis, unspecified: Secondary | ICD-10-CM | POA: Diagnosis not present

## 2017-10-14 DIAGNOSIS — I10 Essential (primary) hypertension: Secondary | ICD-10-CM | POA: Diagnosis not present

## 2017-10-15 DIAGNOSIS — J449 Chronic obstructive pulmonary disease, unspecified: Secondary | ICD-10-CM | POA: Diagnosis not present

## 2017-10-15 DIAGNOSIS — I10 Essential (primary) hypertension: Secondary | ICD-10-CM | POA: Diagnosis not present

## 2017-10-21 DIAGNOSIS — L710 Perioral dermatitis: Secondary | ICD-10-CM | POA: Diagnosis not present

## 2017-10-21 DIAGNOSIS — L720 Epidermal cyst: Secondary | ICD-10-CM | POA: Diagnosis not present

## 2017-10-21 DIAGNOSIS — D485 Neoplasm of uncertain behavior of skin: Secondary | ICD-10-CM | POA: Diagnosis not present

## 2017-10-21 DIAGNOSIS — L739 Follicular disorder, unspecified: Secondary | ICD-10-CM | POA: Diagnosis not present

## 2017-11-15 DIAGNOSIS — J449 Chronic obstructive pulmonary disease, unspecified: Secondary | ICD-10-CM | POA: Diagnosis not present

## 2017-11-15 DIAGNOSIS — I10 Essential (primary) hypertension: Secondary | ICD-10-CM | POA: Diagnosis not present

## 2017-11-27 DIAGNOSIS — J449 Chronic obstructive pulmonary disease, unspecified: Secondary | ICD-10-CM | POA: Diagnosis not present

## 2017-11-27 DIAGNOSIS — Z6827 Body mass index (BMI) 27.0-27.9, adult: Secondary | ICD-10-CM | POA: Diagnosis not present

## 2017-11-27 DIAGNOSIS — J441 Chronic obstructive pulmonary disease with (acute) exacerbation: Secondary | ICD-10-CM | POA: Diagnosis not present

## 2017-11-27 DIAGNOSIS — Z299 Encounter for prophylactic measures, unspecified: Secondary | ICD-10-CM | POA: Diagnosis not present

## 2017-11-27 DIAGNOSIS — I10 Essential (primary) hypertension: Secondary | ICD-10-CM | POA: Diagnosis not present

## 2017-11-27 DIAGNOSIS — F329 Major depressive disorder, single episode, unspecified: Secondary | ICD-10-CM | POA: Diagnosis not present

## 2017-12-17 ENCOUNTER — Encounter: Payer: Self-pay | Admitting: Cardiology

## 2017-12-17 ENCOUNTER — Ambulatory Visit (INDEPENDENT_AMBULATORY_CARE_PROVIDER_SITE_OTHER): Payer: Medicare Other | Admitting: Cardiology

## 2017-12-17 VITALS — BP 130/73 | HR 94 | Ht 68.5 in | Wt 174.4 lb

## 2017-12-17 DIAGNOSIS — I5032 Chronic diastolic (congestive) heart failure: Secondary | ICD-10-CM

## 2017-12-17 DIAGNOSIS — R0602 Shortness of breath: Secondary | ICD-10-CM

## 2017-12-17 DIAGNOSIS — I10 Essential (primary) hypertension: Secondary | ICD-10-CM | POA: Diagnosis not present

## 2017-12-17 DIAGNOSIS — R002 Palpitations: Secondary | ICD-10-CM

## 2017-12-17 MED ORDER — CHLORTHALIDONE 25 MG PO TABS: 25.0000 mg | ORAL_TABLET | Freq: Every day | ORAL | 1 refills | Status: DC

## 2017-12-17 NOTE — Progress Notes (Signed)
Clinical Summary Mr. Gullikson is a 77 y.o.male seen today for follow up of the following medical problems.   1. DOE/Chest pain - on inhalers, prednisone,and abx recently. Symptoms not much improved - DOE with walking up one flight of stairs. DOE with using his weedeater at 15 minutes. Limited walking at inclines as well, has to stop and rest.  - can get chest pain at times. Pressure midchest that is variable in severity, Resolves with rest. Symptoms ongoing for several years. Some increase in frequency.  - completed a nuclear stress test that showed no clear ischemia, echo with LVEF 27-74%, grade I diastolic dysfunction.  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 Mean PA 23, LVEDP 24, CI 2.3  - completed pulmonary rehab - on home O2 24 hrs a day 2 L  - increased cough and wheezing, occasional productive.  - has had some chest pain. Can be worst with cough, deep breaths. Can be better with position.  - can have some high heart rates at home. Uses albuterol nebulizer bid and inhaler prn q 6hrs. He is on prednisone 10mg  daily.   2. COPD - followed by Dr Woody Seller and Dr Lake Bells - PFTs 12/2015 very severe obstruction -onhome O2    3. HTN - ran out of chlorthladone in June.     Past Medical History:  Diagnosis Date  . Actinic keratosis   . Asthma   . COPD (chronic obstructive pulmonary disease) ([place])   . Hypertension   . Insomnia   . Seasonal allergies   . Tobacco abuse      Allergies  Allergen Reactions  . Brovana [Arformoterol] Shortness Of Breath  . Pulmicort [Budesonide] Shortness Of Breath  . Symbicort [Budesonide-Formoterol Fumarate] Other (See Comments)    headache     Current Outpatient Medications  Medication Sig Dispense Refill  . albuterol (PROVENTIL HFA;VENTOLIN HFA) 108 (90 Base) MCG/ACT inhaler Inhale 2-4 puffs into the lungs every 6 (six) hours as needed for wheezing or shortness of breath.    Marland Kitchen albuterol (PROVENTIL) (2.5 MG/3ML) 0.083% nebulizer  solution Take 2.5 mg by nebulization every 4 (four) hours as needed for wheezing or shortness of breath.     . ALPRAZolam (XANAX) 1 MG tablet Take 1 mg by mouth at bedtime. PATIENT TAKES 1/2 TABLET IN THE MORNING AND 1 TABLET AT BEDTIME    . aspirin EC 81 MG tablet Take 81 mg by mouth at bedtime.     Marland Kitchen b complex vitamins tablet Take 1 tablet by mouth daily.    . chlorthalidone (HYGROTON) 25 MG tablet Take 1 tablet (25 mg total) by mouth daily. 90 tablet 1  . Cholecalciferol (VITAMIN D3 PO) Take 1 capsule by mouth daily.    . Cyanocobalamin (B-12 SL) Place 1 tablet under the tongue daily.    Marland Kitchen doxycycline (VIBRA-TABS) 100 MG tablet Take 100 mg by mouth daily.    . Fluticasone-Umeclidin-Vilant (TRELEGY ELLIPTA IN) Inhale 1 puff into the lungs every morning.     Marland Kitchen guaiFENesin (MUCINEX) 600 MG 12 hr tablet Take 600 mg by mouth 2 (two) times daily.    Marland Kitchen loratadine-pseudoephedrine (CLARITIN-D 12-HOUR) 5-120 MG tablet Take 1 tablet by mouth 2 (two) times daily as needed.     Marland Kitchen losartan (COZAAR) 100 MG tablet Take 100 mg by mouth daily.    . Multiple Vitamins-Minerals (VISION VITAMINS PO) Take 1 tablet by mouth 2 (two) times daily.    . naproxen sodium (ANAPROX) 220 MG tablet Take 440 mg  by mouth as needed (pain).    . pantoprazole (PROTONIX) 40 MG tablet Take 40 mg by mouth daily.    . vitamin E 400 UNIT capsule Take 400 Units by mouth daily.     No current facility-administered medications for this visit.      Past Surgical History:  Procedure Laterality Date  . BREAST BIOPSY Left   . INGUINAL HERNIA REPAIR    . LUMBAR LAMINECTOMY     Dr. Joya Salm  . RIGHT/LEFT HEART CATH AND CORONARY ANGIOGRAPHY N/A 07/10/2016   Procedure: Right/Left Heart Cath and Coronary Angiography;  Surgeon: Jettie Booze, MD;  Location: [facility];  Service: Cardiovascular;  Laterality: N/A;  . SHOULDER SURGERY Left      Allergies  Allergen Reactions  . Brovana [Arformoterol] Shortness Of Breath  .  Pulmicort [Budesonide] Shortness Of Breath  . Symbicort [Budesonide-Formoterol Fumarate] Other (See Comments)    headache      Family History  Problem Relation Age of Onset  . Heart disease Father   . Heart disease Mother      Social History Mr. Pinera reports that he quit smoking about 2 years ago. His smoking use included cigarettes. He started smoking about 59 years ago. He has a 57.00 pack-year smoking history. He has never used smokeless tobacco. Mr. Harlin has no alcohol history on file.   Review of Systems CONSTITUTIONAL: No weight loss, fever, chills, weakness or fatigue.  HEENT: Eyes: No visual loss, blurred vision, double vision or yellow sclerae.No hearing loss, sneezing, congestion, runny nose or sore throat.  SKIN: No rash or itching.  CARDIOVASCULAR: per hpi RESPIRATORY: per hpi GASTROINTESTINAL: No anorexia, nausea, vomiting or diarrhea. No abdominal pain or blood.  GENITOURINARY: No burning on urination, no polyuria NEUROLOGICAL: No headache, dizziness, syncope, paralysis, ataxia, numbness or tingling in the extremities. No change in bowel or bladder control.  MUSCULOSKELETAL: No muscle, back pain, joint pain or stiffness.  LYMPHATICS: No enlarged nodes. No history of splenectomy.  PSYCHIATRIC: No history of depression or anxiety.  ENDOCRINOLOGIC: No reports of sweating, cold or heat intolerance. No polyuria or polydipsia.  Marland Kitchen   Physical Examination Vitals:   12/17/17 1456  BP: 130/73  Pulse: 94  SpO2: 99%   Vitals:   12/17/17 1456  Weight: 174 lb 6.4 oz (79.1 kg)  Height: 5' 8.5" (1.74 m)    Gen: resting comfortably, no acute distress HEENT: no scleral icterus, pupils equal round and reactive, no palptable cervical adenopathy,  CV: RRR, no m/r/g, no jvd Resp: Clear to auscultation bilaterally GI: abdomen is soft, non-tender, non-distended, normal bowel sounds, no hepatosplenomegaly MSK: extremities are warm, no edema.  Skin: warm, no  rash Neuro:  no focal deficits Psych: appropriate affect   Diagnostic Studies 03/2015 echo Study Conclusions  - Procedure narrative: Transthoracic echocardiography for left ventricular function evaluation, for right ventricular function evaluation, and for assessment of valvular function. Image quality was adequate. The study was technically difficult, as a result of poor sound wave transmission. - Left ventricle: The cavity size was normal. There was mild concentric hypertrophy. Systolic function was normal. The estimated ejection fraction was in the range of 60% to 65%. Images were inadequate for LV wall motion assessment. However, no gross regional variation on available images including apical 4-chamber views. Doppler parameters are consistent with abnormal left ventricular relaxation (grade 1 diastolic dysfunction).   05/2015 MPI  Diffuse resting ST segment abnormalities.  Fixed inferior wall defect extending from apex to base with  some inferoseptal involvement which is likely due to soft tissue attenuation artifact. No ischemic territories.  This is a low risk study.  Nuclear stress EF: 55%.  06/2016 cath  Mid RCA lesion, 10 %stenosed.  Mid LAD lesion, 10 %stenosed.  Ost 2nd Diag to 2nd Diag lesion, 50 %stenosed.  Mid Cx to Dist Cx lesion, 10 %stenosed.  The left ventricular systolic function is normal.  LV end diastolic pressure is moderately elevated.  The left ventricular ejection fraction is 55-65% by visual estimate.  There is no aortic valve stenosis.  No pulmonary artery hypertension. PA sat 64%. CO 4.6 L/min. CI 2.3.  Mild volume overload. Consider treatment with diuretic.   Smoking cessation recommended. Shortness of breath may be related to lung issues as well. F/u with Dr. Harl Bowie.   10/2016 AAA [place] Screen No aneurysm    Assessment and Plan   1.Chronic diastolic HF - appers euvolemic on exam. Previously had  some elevated filling pressures by cath - ran out of chlorthalidone, we will restart 25mg  daily.   2. HTN - restart chlorthalidone 25mg  daily, he ran out  3.Tachycardia/Palpitations - prior event monitor was benign - likely due to albuterol, predisnone, chronic hypoxia with severe COPD.   4. SOB - appears to be due to his COPD.  - continue volume control related to his diastolic HF.     F/u 6 months     Arnoldo Lenis, M.D.

## 2017-12-17 NOTE — Patient Instructions (Signed)

## 2017-12-18 ENCOUNTER — Encounter: Payer: Self-pay | Admitting: *Deleted

## 2017-12-24 DIAGNOSIS — I10 Essential (primary) hypertension: Secondary | ICD-10-CM | POA: Diagnosis not present

## 2017-12-24 DIAGNOSIS — J449 Chronic obstructive pulmonary disease, unspecified: Secondary | ICD-10-CM | POA: Diagnosis not present

## 2018-01-08 DIAGNOSIS — I10 Essential (primary) hypertension: Secondary | ICD-10-CM | POA: Diagnosis not present

## 2018-01-08 DIAGNOSIS — F321 Major depressive disorder, single episode, moderate: Secondary | ICD-10-CM | POA: Diagnosis not present

## 2018-01-08 DIAGNOSIS — J449 Chronic obstructive pulmonary disease, unspecified: Secondary | ICD-10-CM | POA: Diagnosis not present

## 2018-01-08 DIAGNOSIS — Z6827 Body mass index (BMI) 27.0-27.9, adult: Secondary | ICD-10-CM | POA: Diagnosis not present

## 2018-01-08 DIAGNOSIS — Z299 Encounter for prophylactic measures, unspecified: Secondary | ICD-10-CM | POA: Diagnosis not present

## 2018-01-08 DIAGNOSIS — F419 Anxiety disorder, unspecified: Secondary | ICD-10-CM | POA: Diagnosis not present

## 2018-01-17 DIAGNOSIS — I10 Essential (primary) hypertension: Secondary | ICD-10-CM | POA: Diagnosis not present

## 2018-01-17 DIAGNOSIS — J449 Chronic obstructive pulmonary disease, unspecified: Secondary | ICD-10-CM | POA: Diagnosis not present

## 2018-01-20 DIAGNOSIS — Z299 Encounter for prophylactic measures, unspecified: Secondary | ICD-10-CM | POA: Diagnosis not present

## 2018-01-20 DIAGNOSIS — J449 Chronic obstructive pulmonary disease, unspecified: Secondary | ICD-10-CM | POA: Diagnosis not present

## 2018-01-20 DIAGNOSIS — R55 Syncope and collapse: Secondary | ICD-10-CM | POA: Diagnosis not present

## 2018-01-20 DIAGNOSIS — I10 Essential (primary) hypertension: Secondary | ICD-10-CM | POA: Diagnosis not present

## 2018-01-20 DIAGNOSIS — Z6827 Body mass index (BMI) 27.0-27.9, adult: Secondary | ICD-10-CM | POA: Diagnosis not present

## 2018-02-17 DIAGNOSIS — I10 Essential (primary) hypertension: Secondary | ICD-10-CM | POA: Diagnosis not present

## 2018-02-17 DIAGNOSIS — J449 Chronic obstructive pulmonary disease, unspecified: Secondary | ICD-10-CM | POA: Diagnosis not present

## 2018-03-20 DIAGNOSIS — I10 Essential (primary) hypertension: Secondary | ICD-10-CM | POA: Diagnosis not present

## 2018-03-20 DIAGNOSIS — J449 Chronic obstructive pulmonary disease, unspecified: Secondary | ICD-10-CM | POA: Diagnosis not present

## 2018-03-27 DIAGNOSIS — F329 Major depressive disorder, single episode, unspecified: Secondary | ICD-10-CM | POA: Diagnosis not present

## 2018-03-27 DIAGNOSIS — I10 Essential (primary) hypertension: Secondary | ICD-10-CM | POA: Diagnosis not present

## 2018-03-27 DIAGNOSIS — Z1339 Encounter for screening examination for other mental health and behavioral disorders: Secondary | ICD-10-CM | POA: Diagnosis not present

## 2018-03-27 DIAGNOSIS — Z0000 Encounter for general adult medical examination without abnormal findings: Secondary | ICD-10-CM | POA: Diagnosis not present

## 2018-03-27 DIAGNOSIS — Z125 Encounter for screening for malignant neoplasm of prostate: Secondary | ICD-10-CM | POA: Diagnosis not present

## 2018-03-27 DIAGNOSIS — Z7189 Other specified counseling: Secondary | ICD-10-CM | POA: Diagnosis not present

## 2018-03-27 DIAGNOSIS — Z6827 Body mass index (BMI) 27.0-27.9, adult: Secondary | ICD-10-CM | POA: Diagnosis not present

## 2018-03-27 DIAGNOSIS — F419 Anxiety disorder, unspecified: Secondary | ICD-10-CM | POA: Diagnosis not present

## 2018-03-27 DIAGNOSIS — Z79899 Other long term (current) drug therapy: Secondary | ICD-10-CM | POA: Diagnosis not present

## 2018-03-27 DIAGNOSIS — Z299 Encounter for prophylactic measures, unspecified: Secondary | ICD-10-CM | POA: Diagnosis not present

## 2018-03-27 DIAGNOSIS — R5383 Other fatigue: Secondary | ICD-10-CM | POA: Diagnosis not present

## 2018-03-27 DIAGNOSIS — Z1331 Encounter for screening for depression: Secondary | ICD-10-CM | POA: Diagnosis not present

## 2018-04-01 DIAGNOSIS — Z6828 Body mass index (BMI) 28.0-28.9, adult: Secondary | ICD-10-CM | POA: Diagnosis not present

## 2018-04-01 DIAGNOSIS — Z299 Encounter for prophylactic measures, unspecified: Secondary | ICD-10-CM | POA: Diagnosis not present

## 2018-04-01 DIAGNOSIS — F321 Major depressive disorder, single episode, moderate: Secondary | ICD-10-CM | POA: Diagnosis not present

## 2018-04-01 DIAGNOSIS — I10 Essential (primary) hypertension: Secondary | ICD-10-CM | POA: Diagnosis not present

## 2018-04-01 DIAGNOSIS — R739 Hyperglycemia, unspecified: Secondary | ICD-10-CM | POA: Diagnosis not present

## 2018-04-01 DIAGNOSIS — J449 Chronic obstructive pulmonary disease, unspecified: Secondary | ICD-10-CM | POA: Diagnosis not present

## 2018-04-17 DIAGNOSIS — I10 Essential (primary) hypertension: Secondary | ICD-10-CM | POA: Diagnosis not present

## 2018-04-17 DIAGNOSIS — J449 Chronic obstructive pulmonary disease, unspecified: Secondary | ICD-10-CM | POA: Diagnosis not present

## 2018-04-21 DIAGNOSIS — L570 Actinic keratosis: Secondary | ICD-10-CM | POA: Diagnosis not present

## 2018-04-21 DIAGNOSIS — D0421 Carcinoma in situ of skin of right ear and external auricular canal: Secondary | ICD-10-CM | POA: Diagnosis not present

## 2018-04-21 DIAGNOSIS — L719 Rosacea, unspecified: Secondary | ICD-10-CM | POA: Diagnosis not present

## 2018-04-21 DIAGNOSIS — D485 Neoplasm of uncertain behavior of skin: Secondary | ICD-10-CM | POA: Diagnosis not present

## 2018-05-01 DIAGNOSIS — C44222 Squamous cell carcinoma of skin of right ear and external auricular canal: Secondary | ICD-10-CM | POA: Diagnosis not present

## 2018-05-28 DIAGNOSIS — Z87891 Personal history of nicotine dependence: Secondary | ICD-10-CM | POA: Diagnosis not present

## 2018-05-28 DIAGNOSIS — J309 Allergic rhinitis, unspecified: Secondary | ICD-10-CM | POA: Diagnosis not present

## 2018-05-28 DIAGNOSIS — F321 Major depressive disorder, single episode, moderate: Secondary | ICD-10-CM | POA: Diagnosis not present

## 2018-05-28 DIAGNOSIS — Z6829 Body mass index (BMI) 29.0-29.9, adult: Secondary | ICD-10-CM | POA: Diagnosis not present

## 2018-05-28 DIAGNOSIS — J449 Chronic obstructive pulmonary disease, unspecified: Secondary | ICD-10-CM | POA: Diagnosis not present

## 2018-05-28 DIAGNOSIS — Z299 Encounter for prophylactic measures, unspecified: Secondary | ICD-10-CM | POA: Diagnosis not present

## 2018-05-28 DIAGNOSIS — I10 Essential (primary) hypertension: Secondary | ICD-10-CM | POA: Diagnosis not present

## 2018-05-28 DIAGNOSIS — R05 Cough: Secondary | ICD-10-CM | POA: Diagnosis not present

## 2018-05-30 DIAGNOSIS — R911 Solitary pulmonary nodule: Secondary | ICD-10-CM | POA: Diagnosis not present

## 2018-05-30 DIAGNOSIS — J449 Chronic obstructive pulmonary disease, unspecified: Secondary | ICD-10-CM | POA: Diagnosis not present

## 2018-05-30 DIAGNOSIS — R05 Cough: Secondary | ICD-10-CM | POA: Diagnosis not present

## 2018-07-02 DIAGNOSIS — J449 Chronic obstructive pulmonary disease, unspecified: Secondary | ICD-10-CM | POA: Diagnosis not present

## 2018-07-02 DIAGNOSIS — I10 Essential (primary) hypertension: Secondary | ICD-10-CM | POA: Diagnosis not present

## 2018-07-28 DIAGNOSIS — Z6827 Body mass index (BMI) 27.0-27.9, adult: Secondary | ICD-10-CM | POA: Diagnosis not present

## 2018-07-28 DIAGNOSIS — F321 Major depressive disorder, single episode, moderate: Secondary | ICD-10-CM | POA: Diagnosis not present

## 2018-07-28 DIAGNOSIS — Z299 Encounter for prophylactic measures, unspecified: Secondary | ICD-10-CM | POA: Diagnosis not present

## 2018-07-28 DIAGNOSIS — J449 Chronic obstructive pulmonary disease, unspecified: Secondary | ICD-10-CM | POA: Diagnosis not present

## 2018-07-28 DIAGNOSIS — I10 Essential (primary) hypertension: Secondary | ICD-10-CM | POA: Diagnosis not present

## 2018-07-28 DIAGNOSIS — K219 Gastro-esophageal reflux disease without esophagitis: Secondary | ICD-10-CM | POA: Diagnosis not present

## 2018-07-30 DIAGNOSIS — I10 Essential (primary) hypertension: Secondary | ICD-10-CM | POA: Diagnosis not present

## 2018-07-30 DIAGNOSIS — J449 Chronic obstructive pulmonary disease, unspecified: Secondary | ICD-10-CM | POA: Diagnosis not present

## 2018-08-26 DIAGNOSIS — I10 Essential (primary) hypertension: Secondary | ICD-10-CM | POA: Diagnosis not present

## 2018-08-26 DIAGNOSIS — J449 Chronic obstructive pulmonary disease, unspecified: Secondary | ICD-10-CM | POA: Diagnosis not present

## 2018-09-26 DIAGNOSIS — J449 Chronic obstructive pulmonary disease, unspecified: Secondary | ICD-10-CM | POA: Diagnosis not present

## 2018-09-26 DIAGNOSIS — I10 Essential (primary) hypertension: Secondary | ICD-10-CM | POA: Diagnosis not present

## 2018-09-30 DIAGNOSIS — R0602 Shortness of breath: Secondary | ICD-10-CM | POA: Diagnosis not present

## 2018-09-30 DIAGNOSIS — R140 Abdominal distension (gaseous): Secondary | ICD-10-CM | POA: Diagnosis not present

## 2018-09-30 DIAGNOSIS — N3281 Overactive bladder: Secondary | ICD-10-CM | POA: Diagnosis present

## 2018-09-30 DIAGNOSIS — R05 Cough: Secondary | ICD-10-CM | POA: Diagnosis not present

## 2018-09-30 DIAGNOSIS — J9621 Acute and chronic respiratory failure with hypoxia: Secondary | ICD-10-CM | POA: Diagnosis present

## 2018-09-30 DIAGNOSIS — I10 Essential (primary) hypertension: Secondary | ICD-10-CM | POA: Diagnosis present

## 2018-09-30 DIAGNOSIS — J441 Chronic obstructive pulmonary disease with (acute) exacerbation: Secondary | ICD-10-CM | POA: Diagnosis not present

## 2018-09-30 DIAGNOSIS — Z20828 Contact with and (suspected) exposure to other viral communicable diseases: Secondary | ICD-10-CM | POA: Diagnosis not present

## 2018-09-30 DIAGNOSIS — N400 Enlarged prostate without lower urinary tract symptoms: Secondary | ICD-10-CM | POA: Diagnosis present

## 2018-09-30 DIAGNOSIS — J440 Chronic obstructive pulmonary disease with acute lower respiratory infection: Secondary | ICD-10-CM | POA: Diagnosis present

## 2018-09-30 DIAGNOSIS — Z9981 Dependence on supplemental oxygen: Secondary | ICD-10-CM | POA: Diagnosis not present

## 2018-09-30 DIAGNOSIS — Z87891 Personal history of nicotine dependence: Secondary | ICD-10-CM | POA: Diagnosis not present

## 2018-09-30 DIAGNOSIS — J209 Acute bronchitis, unspecified: Secondary | ICD-10-CM | POA: Diagnosis not present

## 2018-10-13 DIAGNOSIS — Z09 Encounter for follow-up examination after completed treatment for conditions other than malignant neoplasm: Secondary | ICD-10-CM | POA: Diagnosis not present

## 2018-10-13 DIAGNOSIS — J449 Chronic obstructive pulmonary disease, unspecified: Secondary | ICD-10-CM | POA: Diagnosis not present

## 2018-10-13 DIAGNOSIS — Z6829 Body mass index (BMI) 29.0-29.9, adult: Secondary | ICD-10-CM | POA: Diagnosis not present

## 2018-10-13 DIAGNOSIS — F321 Major depressive disorder, single episode, moderate: Secondary | ICD-10-CM | POA: Diagnosis not present

## 2018-10-13 DIAGNOSIS — Z299 Encounter for prophylactic measures, unspecified: Secondary | ICD-10-CM | POA: Diagnosis not present

## 2018-10-13 DIAGNOSIS — I10 Essential (primary) hypertension: Secondary | ICD-10-CM | POA: Diagnosis not present

## 2018-10-23 NOTE — Progress Notes (Signed)
Virtual Visit via Telephone Note  I connected with Dominic Willis on 10/24/18 at  2:00 PM EDT by telephone and verified that I am speaking with the correct person using two identifiers.  Location: Patient: Home Provider: [facility] - [address], [place], [place], [place] [address]   I discussed the limitations, risks, security and privacy concerns of performing an evaluation and management service by telephone and the availability of in person appointments. I also discussed with the patient that there may be a patient responsible charge related to this service. The patient expressed understanding and agreed to proceed.  Patient consented to consult via telephone: Yes People present and their role in pt care: Pt    History of Present Illness: 78 year old male former smoker followed in our office for COPD  PMH: Hypertension, allergies Smoker/ Smoking History: Former smoker.  Quit 2017.  57-pack-year smoking history. Maintenance: Trelegy Ellipta, Daliresp, 20mg  prednisone Pt of: Dr. Lake Willis, interested in switching to Dr. Valeta Willis  Chief complaint: 10/24/2018 -worsening shortness of breath  78 year old male patient followed in our office by Dr. Lake Willis for COPD.  Patient was last seen in our office in 2018.  Patient completing a tele-visit with [place] today due to continued shortness of breath, dry cough as well as wheezing.  Patient was seen in [facility] for what is believed to be a COPD exacerbation.  Patient is unsure what his official diagnosis was at [facility] and I do not have these records available today.  Patient reports that he very rarely has a productive cough.  If it is productive it is a light white to clear mucus.  He has had a 10 pound weight gain over the past 2 months.  Patient reports he is not having swelling bilaterally but said his left leg is more swollen as well as left wrist.  He reports that this is his baseline.  He does not  have any leg pain.  He is not having any chest pain.  Oxygen levels per patient are stable between 92 and 96%.  Patient is maintained on doxycycline daily by dermatology for rosacea.  MMRC - Breathlessness Score 4 - I am too breathless to leave the house or I am breathlessness when dressing    Observations/Objective:  Imaging: 12/2015 CXR images showing emphysema.    PFT: September                                                                                                                      2017 simple spirometry clear airflow obstruction, FEV1 0.92 L (32% predicted)  Other tests: February 2017 nuclear stress test> "low risk study".  December 2016 echocardiogram LV EF 03-50%, grade 1 diastolic dysfunction   6 minute walk in October 2017 walk 288 m over the course of 4 minutes and 27 seconds. O2 saturation dropped no lower than 94%  Assessment and Plan:  COPD exacerbation ([place]) Plan: Likely COPD exacerbation Difficult to fully assess telephonically Patient  will need to have close follow-up with our office Increase prednisone to 40 mg daily for 7 days, then 30 mg daily for 7 days then resume 20 mg daily   Centrilobular emphysema ([facility]) Assessment: 2017 simple office spirometry shows an FEV1 of 0.92 (32% predicted), no full pulmonary function testing 2017 chest x-ray showing hyperinflation/emphysema No CT axial imaging Managed on Trelegy Ellipta, Daliresp, 20 mg of prednisone daily, doxycycline daily for management of rosacea Patient reporting worsened cough, increased shortness of breath, increased wheezing  Plan: Increase prednisone with an extended taper Close follow-up with our office in 2 weeks 4-week follow-up with Dr. Valeta Willis to establish as a new patient Continue Trelegy Ellipta Continue Daliresp We will follow-up with the patient next week to ensure symptoms are still stable and not worsening  Edema leg Assessment: Per patient lower extremity swelling, left  greater than right No current pain Not managed on blood thinners at this time Patient feels this is his normal baseline  Plan: We will need to do baseline blood work at next office visit We will follow-up next week to ensure symptoms are improving with increased prednisone if not may need to consider sooner office visit with blood work and physical assessment to further evaluate for potential DVT Patient  Follow Up Instructions:  Return in about 2 weeks (around 11/07/2018), or if symptoms worsen or fail to improve, for Follow up with Dominic Quaker FNP-C, Follow up with Dr. Valeta Willis.   I discussed the assessment and treatment plan with the patient. The patient was provided an opportunity to ask questions and all were answered. The patient agreed with the plan and demonstrated an understanding of the instructions.   The patient was advised to call back or seek an in-person evaluation if the symptoms worsen or if the condition fails to improve as anticipated.  I provided 32 minutes of non-face-to-face time during this encounter.   Lauraine Rinne, NP

## 2018-10-24 ENCOUNTER — Ambulatory Visit (INDEPENDENT_AMBULATORY_CARE_PROVIDER_SITE_OTHER): Payer: Medicare Other | Admitting: Pulmonary Disease

## 2018-10-24 ENCOUNTER — Other Ambulatory Visit: Payer: Self-pay

## 2018-10-24 ENCOUNTER — Encounter: Payer: Self-pay | Admitting: Pulmonary Disease

## 2018-10-24 DIAGNOSIS — J449 Chronic obstructive pulmonary disease, unspecified: Secondary | ICD-10-CM | POA: Diagnosis not present

## 2018-10-24 DIAGNOSIS — J441 Chronic obstructive pulmonary disease with (acute) exacerbation: Secondary | ICD-10-CM

## 2018-10-24 DIAGNOSIS — J432 Centrilobular emphysema: Secondary | ICD-10-CM | POA: Diagnosis not present

## 2018-10-24 DIAGNOSIS — R600 Localized edema: Secondary | ICD-10-CM | POA: Diagnosis not present

## 2018-10-24 MED ORDER — PREDNISONE 10 MG PO TABS: ORAL_TABLET | ORAL | 0 refills | Status: AC

## 2018-10-24 NOTE — Assessment & Plan Note (Signed)
Assessment: Per patient lower extremity swelling, left greater than right No current pain Not managed on blood thinners at this time Patient feels this is his normal baseline  Plan: We will need to do baseline blood work at next office visit We will follow-up next week to ensure symptoms are improving with increased prednisone if not may need to consider sooner office visit with blood work and physical assessment to further evaluate for potential DVT Patient

## 2018-10-24 NOTE — Patient Instructions (Addendum)
Prednisone 10mg  tablet  >>>4 tabs for 7 days, then 3 tabs for 7 days, Then resume 20mg  daily until next ov >>>take with food  >>>take in the morning   I have sent the additional prednisone to the pharmacy  Continue Doxycycline daily from Dermatology   Trelegy Ellipta  >>> 1 puff daily in the morning >>>rinse mouth out after use  >>> This inhaler contains 3 medications that help manage her respiratory status, contact our office if you cannot afford this medication or unable to remain on this medication   Continue Daliresp   Continue oxygen therapy as prescribed  >>>maintain oxygen saturations greater than 88 percent  >>>if unable to maintain oxygen saturations please contact the office  >>>do not smoke with oxygen  >>>can use nasal saline gel or nasal saline rinses to moisturize nose if oxygen causes dryness  Return in about 2 weeks (around 11/07/2018), or if symptoms worsen or fail to improve, for Follow up with [name] FNP-C. >>> 4 week follow up to establish with Dr. Harle Battiest (COVID-19) Are you at risk?  Are you at risk for the Coronavirus (COVID-19)?  To be considered HIGH RISK for Coronavirus (COVID-19), you have to meet the following criteria:  . Traveled to [place], [place], [place], [place] or [place]; or in the [place] to [place], [place], [place], or [place]; and have fever, cough, and shortness of breath within the last 2 weeks of travel OR . Been in close contact with a person diagnosed with COVID-19 within the last 2 weeks and have fever, cough, and shortness of breath . IF YOU DO NOT MEET THESE CRITERIA, YOU ARE CONSIDERED LOW RISK FOR COVID-19.  What to do if you are HIGH RISK for COVID-19?  Marland Kitchen If you are having a medical emergency, call 911. . Seek medical care right away. Before you go to a doctor's office, urgent care or emergency department, call ahead and tell them about your recent travel, contact with someone diagnosed with COVID-19,  and your symptoms. You should receive instructions from your physician's office regarding next steps of care.  . When you arrive at healthcare provider, tell the healthcare staff immediately you have returned from visiting [place], [place], [place], [place] or [place]; or traveled in the [place] to [place], [place], [place], or [place]; in the last two weeks or you have been in close contact with a person diagnosed with COVID-19 in the last 2 weeks.   . Tell the health care staff about your symptoms: fever, cough and shortness of breath. . After you have been seen by a medical provider, you will be either: o Tested for (COVID-19) and discharged home on quarantine except to seek medical care if symptoms worsen, and asked to  - Stay home and avoid contact with others until you get your results (4-5 days)  - Avoid travel on public transportation if possible (such as bus, train, or airplane) or o Sent to the Emergency Department by EMS for evaluation, COVID-19 testing, and possible admission depending on your condition and test results.  What to do if you are LOW RISK for COVID-19?  Reduce your risk of any infection by using the same precautions used for avoiding the common cold or flu:  Marland Kitchen Wash your hands often with soap and warm water for at least 20 seconds.  If soap and water are not readily available, use an alcohol-based hand sanitizer with at least 60% alcohol.  . If  coughing or sneezing, cover your mouth and nose by coughing or sneezing into the elbow areas of your shirt or coat, into a tissue or into your sleeve (not your hands). . Avoid shaking hands with others and consider head nods or verbal greetings only. . Avoid touching your eyes, nose, or mouth with unwashed hands.  . Avoid close contact with people who are sick. . Avoid places or events with large numbers of people in one location, like concerts or sporting events. . Carefully consider travel plans you have or are  making. . If you are planning any travel outside or inside the [place], visit the CDC's Travelers' Health webpage for the latest health notices. . If you have some symptoms but not all symptoms, continue to monitor at home and seek medical attention if your symptoms worsen. . If you are having a medical emergency, call 911.   [place] / e-Visit: [email or web address]         [facility]: [facility]: [phone]                   [facility]: [phone]           It is flu season:   >>> Best ways to protect herself from the flu: Receive the yearly flu vaccine, practice good hand hygiene washing with soap and also using hand sanitizer when available, eat a nutritious meals, get adequate rest, hydrate appropriately   Please contact the office if your symptoms worsen or you have concerns that you are not improving.   Thank you for choosing [facility] for your healthcare, and for allowing [place] to partner with you on your healthcare journey. I am thankful to be able to provide care to you today.   [name] FNP-C

## 2018-10-24 NOTE — Assessment & Plan Note (Signed)
Assessment: 2017 simple office spirometry shows an FEV1 of 0.92 (32% predicted), no full pulmonary function testing 2017 chest x-ray showing hyperinflation/emphysema No CT axial imaging Managed on Trelegy Ellipta, Daliresp, 20 mg of prednisone daily, doxycycline daily for management of rosacea Patient reporting worsened cough, increased shortness of breath, increased wheezing  Plan: Increase prednisone with an extended taper Close follow-up with our office in 2 weeks 4-week follow-up with [name] to establish as a new patient Continue Trelegy Ellipta Continue Daliresp We will follow-up with the patient next week to ensure symptoms are still stable and not worsening

## 2018-10-24 NOTE — Assessment & Plan Note (Signed)
Plan: Likely COPD exacerbation Difficult to fully assess telephonically Patient will need to have close follow-up with our office Increase prednisone to 40 mg daily for 7 days, then 30 mg daily for 7 days then resume 20 mg daily

## 2018-10-25 NOTE — Progress Notes (Signed)
Yes that is fine to switch to [name] Reviewed, agree

## 2018-10-27 ENCOUNTER — Telehealth: Payer: Self-pay | Admitting: Pulmonary Disease

## 2018-10-27 NOTE — Telephone Encounter (Signed)
Thank you. Noted.   Dominic Willis  

## 2018-10-27 NOTE — Telephone Encounter (Signed)
I called and spoke with the patient. He states that his breathing is a lot better since the increase of Prednisone. He reports that the swelling in his lower legs are a tad better, but swelling in his hands has gone down a lot. I advised him to call the office with any new or worsening symptoms.

## 2018-10-27 NOTE — Telephone Encounter (Signed)
10/27/2018 Power can you please check on the patient.  We completed a tele-visit with him last week and increased his prednisone.  Have symptoms improved with these recommendations?  Patient was also having lower leg swelling I believe left greater than right.  Has this improved at all?  Or is it worsening?  [name] FNP

## 2018-11-07 ENCOUNTER — Ambulatory Visit: Payer: Medicare Other

## 2018-11-07 ENCOUNTER — Ambulatory Visit (INDEPENDENT_AMBULATORY_CARE_PROVIDER_SITE_OTHER): Payer: Medicare Other | Admitting: Pulmonary Disease

## 2018-11-07 ENCOUNTER — Ambulatory Visit (INDEPENDENT_AMBULATORY_CARE_PROVIDER_SITE_OTHER): Payer: Medicare Other

## 2018-11-07 ENCOUNTER — Other Ambulatory Visit: Payer: Self-pay

## 2018-11-07 ENCOUNTER — Encounter: Payer: Self-pay | Admitting: Pulmonary Disease

## 2018-11-07 VITALS — BP 126/78 | HR 82 | Temp 98.0°F | Ht 68.0 in | Wt 192.8 lb

## 2018-11-07 DIAGNOSIS — R5381 Other malaise: Secondary | ICD-10-CM | POA: Insufficient documentation

## 2018-11-07 DIAGNOSIS — J432 Centrilobular emphysema: Secondary | ICD-10-CM | POA: Diagnosis not present

## 2018-11-07 DIAGNOSIS — R0602 Shortness of breath: Secondary | ICD-10-CM

## 2018-11-07 DIAGNOSIS — Z79899 Other long term (current) drug therapy: Secondary | ICD-10-CM | POA: Insufficient documentation

## 2018-11-07 DIAGNOSIS — R0609 Other forms of dyspnea: Secondary | ICD-10-CM | POA: Diagnosis not present

## 2018-11-07 DIAGNOSIS — R600 Localized edema: Secondary | ICD-10-CM | POA: Diagnosis not present

## 2018-11-07 DIAGNOSIS — Z87891 Personal history of nicotine dependence: Secondary | ICD-10-CM

## 2018-11-07 DIAGNOSIS — J9611 Chronic respiratory failure with hypoxia: Secondary | ICD-10-CM | POA: Insufficient documentation

## 2018-11-07 LAB — COMPREHENSIVE METABOLIC PANEL
ALT: 20 U/L (ref 0–53)
AST: 17 U/L (ref 0–37)
Albumin: 4.1 g/dL (ref 3.5–5.2)
Alkaline Phosphatase: 52 U/L (ref 39–117)
BUN: 32 mg/dL — ABNORMAL HIGH (ref 6–23)
CO2: 32 mEq/L (ref 19–32)
Calcium: 9.4 mg/dL (ref 8.4–10.5)
Chloride: 94 mEq/L — ABNORMAL LOW (ref 96–112)
Creatinine, Ser: 1.27 mg/dL (ref 0.40–1.50)
GFR: 54.76 mL/min — ABNORMAL LOW (ref >60.00)
Glucose, Bld: 146 mg/dL — ABNORMAL HIGH (ref 70–99)
Potassium: 4.4 mEq/L (ref 3.5–5.1)
Sodium: 133 mEq/L — ABNORMAL LOW (ref 135–145)
Total Bilirubin: 0.5 mg/dL (ref 0.2–1.2)
Total Protein: 6.3 g/dL (ref 6.0–8.3)

## 2018-11-07 LAB — CBC WITH DIFFERENTIAL/PLATELET
Basophils Absolute: 0 10*3/uL (ref 0.0–0.1)
Basophils Relative: 0.1 % (ref 0.0–3.0)
Eosinophils Absolute: 0 10*3/uL (ref 0.0–0.7)
Eosinophils Relative: 0.1 % (ref 0.0–5.0)
HCT: 41.2 % (ref 39.0–52.0)
Hemoglobin: 13.6 g/dL (ref 13.0–17.0)
Lymphocytes Relative: 3.7 % — ABNORMAL LOW (ref 12.0–46.0)
Lymphs Abs: 0.4 10*3/uL — ABNORMAL LOW (ref 0.7–4.0)
MCHC: 33 g/dL (ref 30.0–36.0)
MCV: 92.9 fl (ref 78.0–100.0)
Monocytes Absolute: 0.4 10*3/uL (ref 0.1–1.0)
Monocytes Relative: 3 % (ref 3.0–12.0)
Neutro Abs: 11 10*3/uL — ABNORMAL HIGH (ref 1.4–7.7)
Neutrophils Relative %: 93.1 % — ABNORMAL HIGH (ref 43.0–77.0)
Platelets: 265 10*3/uL (ref 150.0–400.0)
RBC: 4.44 Mil/uL (ref 4.22–5.81)
RDW: 14.1 % (ref 11.5–15.5)
WBC: 11.8 10*3/uL — ABNORMAL HIGH (ref 4.0–10.5)

## 2018-11-07 MED ORDER — TRELEGY ELLIPTA 100-62.5-25 MCG/INH IN AEPB: 1.0000 | INHALATION_SPRAY | Freq: Every day | RESPIRATORY_TRACT | 0 refills | Status: DC

## 2018-11-07 NOTE — Assessment & Plan Note (Signed)
Assessment: 2017 simple office spirometry shows FEV1 of 0.92 (32% predicted) 2017 chest x-ray showing hyperinflation and suggesting emphysema No CT axial imaging Managed on Trelegy Ellipta, chronic prednisone, and doxycycline daily for management of rosacea mMRC 4 today Severely deconditioned elderly male Severely diminished breath sounds throughout entire exam  Plan: Chest x-ray today Lab work today Attempted walk in office, patient was unable to tolerate, oxygen saturations stable Referral to home health for home physical therapy to work on physical conditioning Continue Trelegy Ellipta, samples provided today Referral to pharmacy for medication management as monthly Trelegy cost is $330 Pulmonary function test ordered Close follow-up with [name] to establish with him on 11/24/2018

## 2018-11-07 NOTE — Assessment & Plan Note (Addendum)
Assessment: Patient managed by Dr. Harl Bowie with cardiology Last office visit note in September/2019 mentions the patient should be resuming 25 mg of chlorthalidone daily Patient reports has been without his fluid pill for the last 2 to 3 months Patient does not weigh himself regularly Lower extremity swelling 1-2+ Severely deconditioned Weight elevated today 192 pounds  Plan: Patient to contact cardiology and schedule a office visit Lab work today Chest x-ray today Likely patient will need to resume diuretic dosing This is likely playing a role in the patient's worsened shortness of breath in combination with severe COPD

## 2018-11-07 NOTE — Assessment & Plan Note (Signed)
Plan: Trelegy Ellipta samples provided today Referral to pharmacy team for help with Trelegy Ellipta causes patient is reporting 1 month cost $330 out of pocket [employer] patient assistance paperwork started today

## 2018-11-07 NOTE — Progress Notes (Signed)
Chest x-ray stable.  Showing hyperinflation which was present in 2017.  This is likely emphysema related to your COPD.  No acute cardiopulmonary process at this time.  Keep follow-up with our office.  We are waiting on your blood work to result.  [name], FNP

## 2018-11-07 NOTE — Assessment & Plan Note (Signed)
Plan: Continue oxygen therapy as prescribed Pulmonary function test ordered

## 2018-11-07 NOTE — Progress Notes (Signed)
PCCM: Thanks for seeing him Garner Nash, DO [facility] 11/07/2018 5:11 PM

## 2018-11-07 NOTE — Patient Instructions (Addendum)
Chest Xray today   Labwork today   Continue Doxycycline daily from Dermatology   Trelegy Ellipta  >>> 1 puff daily in the morning >>>rinse mouth out after use  >>> This inhaler contains 3 medications that help manage her respiratory status, contact our office if you cannot afford this medication or unable to remain on this medication  Only use your albuterol as a rescue medication to be used if you can't catch your breath by resting or doing a relaxed purse lip breathing pattern.  - The less you use it, the better it will work when you need it. - Ok to use up to 2 puffs  every 4 hours if you must but call for immediate appointment if use goes up over your usual need - Don't leave home without it !!  (think of it like the spare tire for your car)   Note your daily symptoms > remember "red flags" for COPD:   >>>Increase in cough >>>increase in sputum production >>>increase in shortness of breath or activity  intolerance.   If you notice these symptoms, please call the office to be seen.    Continue oxygen therapy as prescribed  >>>maintain oxygen saturations greater than 88 percent  >>>if unable to maintain oxygen saturations please contact the office  >>>do not smoke with oxygen  >>>can use nasal saline gel or nasal saline rinses to moisturize nose if oxygen causes dryness  Will refer to pharmacy for help with cost of medications  Will refer to home health for physical therapy   Will order pulmonary function test   Return in about 17 days (around 11/24/2018), or if symptoms worsen or fail to improve, for Follow up for PFT, Follow up with [name].   Coronavirus (COVID-19) Are you at risk?  Are you at risk for the Coronavirus (COVID-19)?  To be considered HIGH RISK for Coronavirus (COVID-19), you have to meet the following criteria:  . Traveled to [place], [place], [place], [place] or [place]; or in the [place] to [place], [place], [place], or [place]; and  have fever, cough, and shortness of breath within the last 2 weeks of travel OR . Been in close contact with a person diagnosed with COVID-19 within the last 2 weeks and have fever, cough, and shortness of breath . IF YOU DO NOT MEET THESE CRITERIA, YOU ARE CONSIDERED LOW RISK FOR COVID-19.  What to do if you are HIGH RISK for COVID-19?  Marland Kitchen If you are having a medical emergency, call 911. . Seek medical care right away. Before you go to a doctor's office, urgent care or emergency department, call ahead and tell them about your recent travel, contact with someone diagnosed with COVID-19, and your symptoms. You should receive instructions from your physician's office regarding next steps of care.  . When you arrive at healthcare provider, tell the healthcare staff immediately you have returned from visiting [place], [place], [place], [place] or [place]; or traveled in the [place] to [place], [place], [place], or [place]; in the last two weeks or you have been in close contact with a person diagnosed with COVID-19 in the last 2 weeks.   . Tell the health care staff about your symptoms: fever, cough and shortness of breath. . After you have been seen by a medical provider, you will be either: o Tested for (COVID-19) and discharged home on quarantine except to seek medical care if symptoms worsen, and asked to  - Stay  home and avoid contact with others until you get your results (4-5 days)  - Avoid travel on public transportation if possible (such as bus, train, or airplane) or o Sent to the Emergency Department by EMS for evaluation, COVID-19 testing, and possible admission depending on your condition and test results.  What to do if you are LOW RISK for COVID-19?  Reduce your risk of any infection by using the same precautions used for avoiding the common cold or flu:  Marland Kitchen Wash your hands often with soap and warm water for at least 20 seconds.  If soap and water are not readily available,  use an alcohol-based hand sanitizer with at least 60% alcohol.  . If coughing or sneezing, cover your mouth and nose by coughing or sneezing into the elbow areas of your shirt or coat, into a tissue or into your sleeve (not your hands). . Avoid shaking hands with others and consider head nods or verbal greetings only. . Avoid touching your eyes, nose, or mouth with unwashed hands.  . Avoid close contact with people who are sick. . Avoid places or events with large numbers of people in one location, like concerts or sporting events. . Carefully consider travel plans you have or are making. . If you are planning any travel outside or inside the [place], visit the CDC's Travelers' Health webpage for the latest health notices. . If you have some symptoms but not all symptoms, continue to monitor at home and seek medical attention if your symptoms worsen. . If you are having a medical emergency, call 911.   [place] / e-Visit: [email or web address]         [facility]: [facility]: [phone]                   [facility]: [phone]           It is flu season:   >>> Best ways to protect herself from the flu: Receive the yearly flu vaccine, practice good hand hygiene washing with soap and also using hand sanitizer when available, eat a nutritious meals, get adequate rest, hydrate appropriately   Please contact the office if your symptoms worsen or you have concerns that you are not improving.   Thank you for choosing [facility] for your healthcare, and for allowing [place] to partner with you on your healthcare journey. I am thankful to be able to provide care to you today.   [name] FNP-C   Chronic Obstructive Pulmonary Disease Chronic obstructive pulmonary disease (COPD) is a long-term (chronic) lung problem. When you  have COPD, it is hard for air to get in and out of your lungs. Usually the condition gets worse over time, and your lungs will never return to normal. There are things you can do to keep yourself as healthy as possible.  Your doctor may treat your condition with: ? Medicines. ? Oxygen. ? Lung surgery.  Your doctor may also recommend: ? Rehabilitation. This includes steps to make your body work better. It may involve a team of specialists. ? Quitting smoking, if you smoke. ? Exercise and changes to your diet. ? Comfort measures (palliative care). Follow these instructions at home: Medicines  Take over-the-counter and prescription medicines only as told by your doctor.  Talk to your doctor before taking any cough or allergy medicines. You may need to avoid medicines that cause your lungs  to be dry. Lifestyle  If you smoke, stop. Smoking makes the problem worse. If you need help quitting, ask your doctor.  Avoid being around things that make your breathing worse. This may include smoke, chemicals, and fumes.  Stay active, but remember to rest as well.  Learn and use tips on how to relax.  Make sure you get enough sleep. Most adults need at least 7 hours of sleep every night.  Eat healthy foods. Eat smaller meals more often. Rest before meals. Controlled breathing Learn and use tips on how to control your breathing as told by your doctor. Try:  Breathing in (inhaling) through your nose for 1 second. Then, pucker your lips and breath out (exhale) through your lips for 2 seconds.  Putting one hand on your belly (abdomen). Breathe in slowly through your nose for 1 second. Your hand on your belly should move out. Pucker your lips and breathe out slowly through your lips. Your hand on your belly should move in as you breathe out.  Controlled coughing Learn and use controlled coughing to clear mucus from your lungs. Follow these steps: 1. Lean your head a little forward. 2. Breathe in  deeply. 3. Try to hold your breath for 3 seconds. 4. Keep your mouth slightly open while coughing 2 times. 5. Spit any mucus out into a tissue. 6. Rest and do the steps again 1 or 2 times as needed. General instructions  Make sure you get all the shots (vaccines) that your doctor recommends. Ask your doctor about a flu shot and a pneumonia shot.  Use oxygen therapy and pulmonary rehabilitation if told by your doctor. If you need home oxygen therapy, ask your doctor if you should buy a tool to measure your oxygen level (oximeter).  Make a COPD action plan with your doctor. This helps you to know what to do if you feel worse than usual.  Manage any other conditions you have as told by your doctor.  Avoid going outside when it is very hot, cold, or humid.  Avoid people who have a sickness you can catch (contagious).  Keep all follow-up visits as told by your doctor. This is important. Contact a doctor if:  You cough up more mucus than usual.  There is a change in the color or thickness of the mucus.  It is harder to breathe than usual.  Your breathing is faster than usual.  You have trouble sleeping.  You need to use your medicines more often than usual.  You have trouble doing your normal activities such as getting dressed or walking around the house. Get help right away if:  You have shortness of breath while resting.  You have shortness of breath that stops you from: ? Being able to talk. ? Doing normal activities.  Your chest hurts for longer than 5 minutes.  Your skin color is more blue than usual.  Your pulse oximeter shows that you have low oxygen for longer than 5 minutes.  You have a fever.  You feel too tired to breathe normally. Summary  Chronic obstructive pulmonary disease (COPD) is a long-term lung problem.  The way your lungs work will never return to normal. Usually the condition gets worse over time. There are things you can do to keep yourself as  healthy as possible.  Take over-the-counter and prescription medicines only as told by your doctor.  If you smoke, stop. Smoking makes the problem worse. This information is not intended to replace advice  given to you by your health care provider. Make sure you discuss any questions you have with your health care provider. Document Released: 09/19/2007 Document Revised: 03/15/2017 Document Reviewed: 05/07/2016 Elsevier Patient Education  2020 [employer].    COPD and Physical Activity Chronic obstructive pulmonary disease (COPD) is a long-term (chronic) condition that affects the lungs. COPD is a general term that can be used to describe many different lung problems that cause lung swelling (inflammation) and limit airflow, including chronic bronchitis and emphysema. The main symptom of COPD is shortness of breath, which makes it harder to do even simple tasks. This can also make it harder to exercise and be active. Talk with your health care provider about treatments to help you breathe better and actions you can take to prevent breathing problems during physical activity. What are the benefits of exercising with COPD? Exercising regularly is an important part of a healthy lifestyle. You can still exercise and do physical activities even though you have COPD. Exercise and physical activity improve your shortness of breath by increasing blood flow (circulation). This causes your heart to pump more oxygen through your body. Moderate exercise can improve your:  Oxygen use.  Energy level.  Shortness of breath.  Strength in your breathing muscles.  Heart health.  Sleep.  Self-esteem and feelings of self-worth.  Depression, stress, and anxiety levels. Exercise can benefit everyone with COPD. The severity of your disease may affect how hard you can exercise, especially at first, but everyone can benefit. Talk with your health care provider about how much exercise is safe for you, and  which activities and exercises are safe for you. What actions can I take to prevent breathing problems during physical activity?  Sign up for a pulmonary rehabilitation program. This type of program may include: ? Education about lung diseases. ? Exercise classes that teach you how to exercise and be more active while improving your breathing. This usually involves:  Exercise using your lower extremities, such as a stationary bicycle.  About 30 minutes of exercise, 2 to 5 times per week, for 6 to 12 weeks  Strength training, such as push ups or leg lifts. ? Nutrition education. ? Group classes in which you can talk with others who also have COPD and learn ways to manage stress.  If you use an oxygen tank, you should use it while you exercise. Work with your health care provider to adjust your oxygen for your physical activity. Your resting flow rate is different from your flow rate during physical activity.  While you are exercising: ? Take slow breaths. ? Pace yourself and do not try to go too fast. ? Purse your lips while breathing out. Pursing your lips is similar to a kissing or whistling position. ? If doing exercise that uses a quick burst of effort, such as weight lifting:  Breathe in before starting the exercise.  Breathe out during the hardest part of the exercise (such as raising the weights). Where to find support You can find support for exercising with COPD from:  Your health care provider.  A pulmonary rehabilitation program.  Your local health department or community health programs.  Support groups, online or in-person. Your health care provider may be able to recommend support groups. Where to find more information You can find more information about exercising with COPD from:  American Lung Association: ClassInsider.se.  COPD Foundation: https://www.rivera.net/. Contact a health care provider if:  Your symptoms get worse.  You have chest pain.  You have  nausea.  You have a fever.  You have trouble talking or catching your breath.  You want to start a new exercise program or a new activity. Summary  COPD is a general term that can be used to describe many different lung problems that cause lung swelling (inflammation) and limit airflow. This includes chronic bronchitis and emphysema.  Exercise and physical activity improve your shortness of breath by increasing blood flow (circulation). This causes your heart to provide more oxygen to your body.  Contact your health care provider before starting any exercise program or new activity. Ask your health care provider what exercises and activities are safe for you. This information is not intended to replace advice given to you by your health care provider. Make sure you discuss any questions you have with your health care provider. Document Released: 04/25/2017 Document Revised: 07/23/2018 Document Reviewed: 04/25/2017 Elsevier Patient Education  [place] Oxygen Use, Adult When a medical condition keeps you from getting enough oxygen, your health care provider may instruct you to take extra oxygen at home. Your health care provider will let you know:  When to take oxygen.  For how long to take oxygen.  How quickly oxygen should be delivered (flow rate), in liters per minute (LPM or L/M). Home oxygen can be given through:  A mask.  A nasal cannula. This is a device or tube that goes in the nostrils.  A transtracheal catheter. This is a small, flexible tube placed in the trachea.  A tracheostomy. This is a surgically made opening in the trachea. These devices are connected with tubing to an oxygen source, such as:  A tank. Tanks hold oxygen in gas form. They must be replaced when the oxygen is used up.  A liquid oxygen device. This holds oxygen in liquid form. It must be replaced when the oxygen is used up.  An oxygen concentrator machine. This filters oxygen in  the room. It uses electricity, so you must have a backup cylinder of oxygen in case the power goes out. Supplies needed: To use oxygen, you will need:  A mask, nasal cannula, transtracheal catheter, or tracheostomy.  An oxygen tank, a liquid oxygen device, or an oxygen concentrator.  The tape that your health care provider recommends (optional). If you use a transtracheal catheter and your prescribed flow rate is 1 LPM or greater, you will also need a humidifier. Risks and complications  Fire. This can happen if the oxygen is exposed to a heat source, flame, or spark.  Injury to skin. This can happen if liquid oxygen touches your skin.  Organ damage. This can happen if you get too little oxygen. How to use oxygen Your health care provider or a representative from your [place] will show you how to use your oxygen device. Follow her or his instructions. The instructions may look something like this: 1. Wash your hands. 2. If you use an oxygen concentrator, make sure it is plugged in. 3. Place one end of the tube into the port on the tank, device, or machine. 4. Place the mask over your nose and mouth. Or, place the nasal cannula and secure it with tape if instructed. If you use a tracheostomy or transtracheal catheter, connect it to the oxygen source as directed. 5. Make sure the liter-flow setting on the machine is at the level prescribed by your health care provider. 6. Turn on the machine or adjust the knob on  the tank or device to the correct liter-flow setting. 7. When you are done, turn off and unplug the machine, or turn the knob to OFF. How to clean and care for the oxygen supplies Nasal cannula  Clean it with a warm, wet cloth daily or as needed.  Wash it with a liquid soap once a week.  Rinse it thoroughly once or twice a week.  Replace it every 2-4 weeks.  If you have an infection, such as a cold or pneumonia, change the cannula when you get  better. Mask  Replace it every 2-4 weeks.  If you have an infection, such as a cold or pneumonia, change the mask when you get better. Humidifier bottle  Wash the bottle between each refill: ? Wash it with soap and warm water. ? Rinse it thoroughly. ? Disinfect it and its top. ? Air-dry it.  Make sure it is dry before you refill it. Oxygen concentrator  Clean the air filter at least twice a week according to directions from your home medical equipment and service company.  Wipe down the cabinet every day. To do this: ? Unplug the unit. ? Wipe down the cabinet with a damp cloth. ? Dry the cabinet. Other equipment  Change any extra tubing every 1-3 months.  Follow instructions from your health care provider about taking care of any other equipment. Safety tips Fire safety tips   Keep your oxygen and oxygen supplies at least 5 ft away from sources of heat, flames, and sparks at all times.  Do not allow smoking near your oxygen. Put up "no smoking" signs in your home. Avoid smoking areas when in public.  Do not use materials that can burn (are flammable) while you use oxygen.  When you go to a restaurant with portable oxygen, ask to be seated in the nonsmoking section.  Keep a Data processing manager close by. Let your fire department know that you have oxygen in your home.  Test your home smoke detectors regularly. Traveling  Secure your oxygen tank in the vehicle so that it does not move around. Follow instructions from your medical device company about how to safely secure your tank.  Make sure you have enough oxygen for the amount of time you will be away from home.  If you are planning air travel, contact the airline to find out if they allow the use of an approved portable oxygen concentrator. You may also need documents from your health care provider and medical device company before you travel. General safety tips  If you use an oxygen cylinder, make sure it is in a  stand or secured to an object that will not move (fixed object).  If you use liquid oxygen, make sure its container is kept upright.  If you use an oxygen concentrator: ? Dance movement psychotherapist company. Make sure you are given priority service in the event that your power goes out. ? Avoid using extension cords, if possible. Follow these instructions at home:  Use oxygen only as told by your health care provider.  Do not use alcohol or other drugs that make you relax (sedating drugs) unless instructed. They can slow down your breathing rate and make it hard to get in enough oxygen.  Know how and when to order a refill of oxygen.  Always keep a spare tank of oxygen. Plan ahead for holidays when you may not be able to get a prescription filled.  Use water-based lubricants on your lips or nostrils. Do  not use oil-based products like petroleum jelly.  To prevent skin irritation on your cheeks or behind your ears, tuck some gauze under the tubing. Contact a health care provider if:  You get headaches often.  You have shortness of breath.  You have a lasting cough.  You have anxiety.  You are sleepy all the time.  You develop an illness that affects your breathing.  You cannot exercise at your regular level.  You are restless.  You have difficult or irregular breathing, and it is getting worse.  You have a fever.  You have persistent redness under your nose. Get help right away if:  You are confused.  You have blue lips or fingernails.  You are struggling to breathe. Summary  Your health care provider or a representative from your [place] will show you how to use your oxygen device. Follow her or his instructions.  If you use an oxygen concentrator, make sure it is plugged in.  Make sure the liter-flow setting on the machine is at the level prescribed by your health care provider.  Keep your oxygen and oxygen supplies at least 5 ft away from sources of  heat, flames, and sparks at all times. This information is not intended to replace advice given to you by your health care provider. Make sure you discuss any questions you have with your health care provider. Document Released: 06/23/2003 Document Revised: 09/19/2017 Document Reviewed: 10/25/2015 Elsevier Patient Education  [place].    Exercises To Do While Sitting  Exercises that you do while sitting (chair exercises) can give you many of the same benefits as full exercise. Benefits include strengthening your heart, burning calories, and keeping muscles and joints healthy. Exercise can also improve your mood and help with depression and anxiety. You may benefit from chair exercises if you are unable to do standing exercises because of:  Diabetic foot pain.  Obesity.  Illness.  Arthritis.  Recovery from surgery or injury.  Breathing problems.  Balance problems.  Another type of disability. Before starting chair exercises, check with your health care provider or a physical therapist to find out how much exercise you can tolerate and which exercises are safe for you. If your health care provider approves:  Start out slowly and build up over time. Aim to work up to about 10-20 minutes for each exercise session.  Make exercise part of your daily routine.  Drink water when you exercise. Do not wait until you are thirsty. Drink every 10-15 minutes.  Stop exercising right away if you have pain, nausea, shortness of breath, or dizziness.  If you are exercising in a wheelchair, make sure to lock the wheels.  Ask your health care provider whether you can do tai chi or yoga. Many positions in these mind-body exercises can be modified to do while seated. Warm-up Before starting other exercises: 1. Sit up as straight as you can. Have your knees bent at 90 degrees, which is the shape of the capital letter "L." Keep your feet flat on the floor. 2. Sit at the front edge of your  chair, if you can. 3. Pull in (tighten) the muscles in your abdomen and stretch your spine and neck as straight as you can. Hold this position for a few minutes. 4. Breathe in and out evenly. Try to concentrate on your breathing, and relax your mind. Stretching Exercise A: Arm stretch 1. Hold your arms out straight in front of your body. 2. [place] your  hands at the wrist with your fingers pointing up, as if signaling someone to stop. Notice the slight tension in your forearms as you hold the position. 3. Keeping your arms out and your hands bent, rotate your hands outward as far as you can and hold this stretch. Aim to have your thumbs pointing up and your pinkie fingers pointing down. Slowly repeat arm stretches for one minute as tolerated. Exercise B: Leg stretch 1. If you can move your legs, try to "draw" letters on the floor with the toes of your foot. Write your name with one foot. 2. Write your name with the toes of your other foot. Slowly repeat the movements for one minute as tolerated. Exercise C: Reach for the sky 1. Reach your hands as far over your head as you can to stretch your spine. 2. Move your hands and arms as if you are climbing a rope. Slowly repeat the movements for one minute as tolerated. Range of motion exercises Exercise A: Shoulder roll 1. Let your arms hang loosely at your sides. 2. Lift just your shoulders up toward your ears, then let them relax back down. 3. When your shoulders feel loose, rotate your shoulders in backward and forward circles. Do shoulder rolls slowly for one minute as tolerated. Exercise B: March in place 1. As if you are marching, pump your arms and lift your legs up and down. Lift your knees as high as you can. ? If you are unable to lift your knees, just pump your arms and move your ankles and feet up and down. March in place for one minute as tolerated. Exercise C: Seated jumping jacks 1. Let your arms hang down straight. 2. Keeping your  arms straight, lift them up over your head. Aim to point your fingers to the ceiling. 3. While you lift your arms, straighten your legs and slide your heels along the floor to your sides, as wide as you can. 4. As you bring your arms back down to your sides, slide your legs back together. ? If you are unable to use your legs, just move your arms. Slowly repeat seated jumping jacks for one minute as tolerated. Strengthening exercises Exercise A: Shoulder squeeze 1. Hold your arms straight out from your body to your sides, with your elbows bent and your fists pointed at the ceiling. 2. Keeping your arms in the bent position, move them forward so your elbows and forearms meet in front of your face. 3. Open your arms back out as wide as you can with your elbows still bent, until you feel your shoulder blades squeezing together. Hold for 5 seconds. Slowly repeat the movements forward and backward for one minute as tolerated. Contact a health care provider if you:  Had to stop exercising due to any of the following: ? Pain. ? Nausea. ? Shortness of breath. ? Dizziness. ? Fatigue.  Have significant pain or soreness after exercising. Get help right away if you have:  Chest pain.  Difficulty breathing. These symptoms may represent a serious problem that is an emergency. Do not wait to see if the symptoms will go away. Get medical help right away. Call your local emergency services (911 in the [place] Do not drive yourself to the hospital. This information is not intended to replace advice given to you by your health care provider. Make sure you discuss any questions you have with your health care provider. Document Released: 02/13/2017 Document Revised: 07/24/2018 Document Reviewed: 02/13/2017 Elsevier Patient Education  [employer].

## 2018-11-07 NOTE — Assessment & Plan Note (Signed)
Plan: Continue to not smoke 

## 2018-11-07 NOTE — Assessment & Plan Note (Signed)
Plan: Lab work today Chest x-ray today Pulmonary function test ordered Close follow-up with [name] in August/2020 Referral to home health to help with physical activity and increase mobility and strength

## 2018-11-07 NOTE — Assessment & Plan Note (Signed)
Plan: Counseled on importance of physical activity for COPD management Referral to home health for home physical therapy

## 2018-11-07 NOTE — Progress Notes (Addendum)
_0  ID: Dominic Willis, male    DOB: [birth year], 78 y.o.   MRN: [id number]  Chief Complaint  Patient presents with   Follow-up    COPD, short of breath    Referring provider: Glenda Chroman, MD  HPI:  78 year old male former smoker followed in our office for COPD  PMH: Hypertension, allergies Smoker/ Smoking History: Former smoker.  Quit 2017.  57-pack-year smoking history. Maintenance: Trelegy Ellipta, 18m prednisone Pt of: Dr. IValeta Harms 11/07/2018  - Visit   78year old male former smoker followed in our office for COPD.  Patient was last seen in person in 2018.  Since last in person office visit patient has been maintained on Trelegy Ellipta, daily prednisone ranging between 20 to 30 mg daily.  Patient completed a telephonic visit with our office 2 weeks ago where patient was placed on a prednisone taper.  Patient is currently maintained on doxycycline daily from dermatology for rosacea.  Patient reports high cost from Trelegy Ellipta.  They reported that 30 days of Trelegy Ellipta cost $330.  Patient transitioning care from Dr. MLake Bellsto Dr. IValeta Harms  Patient continues to have worsened shortness of breath.  Patient also with diastolic congestive heart failure and has been without his fluid pills for multiple months.  He has not followed up with cardiology Dr. BHarl Bowieregarding this.  It appears that in September/2019 patient was supposed to be on 25 mg of chlorthalidone.  Over the past couple months patient has had waxing and waning shortness of breath as well as productive cough.  The productive cough has stabilized recently but continues to have shortness of breath.  Patient is also maintained on 2 L of O2.  He reports that his oxygen levels at home are always stable and greater than 90% but when he gets up when he physically exerts himself he has severely short of breath.  Walking back to our office around 20 feet patient was visibly winded.  Patient admits that he has not been very  active for at least over the last year.  He mainly lays in bed or sits in a chair and sleeps.  MMRC - Breathlessness Score 4 - I am too breathless to leave the house or I am breathlessness when dressing    Tests:   Imaging: 12/2015 CXR images showing emphysema.    PFT: September 2017 simple spirometry clear airflow obstruction, FEV1 0.92 L (32% predicted)  Other tests: February 2017 nuclear stress test> "low risk study".  December 2016 echocardiogram LV EF 603-49% grade 1 diastolic dysfunction   6 minute walk in October 2017 walk 288 m over the course of 4 minutes and 27 seconds. O2 saturation dropped no lower than 94%  Diagnostic Studies 03/2015 echo Study Conclusions  - Procedure narrative: Transthoracic echocardiography for left ventricular function evaluation, for right ventricular function evaluation, and for assessment of valvular function. Image quality was adequate. The study was technically difficult, as a result of poor sound wave transmission. - Left ventricle: The cavity size was normal. There was mild concentric hypertrophy. Systolic function was normal. The estimated ejection fraction was in the range of 60% to 65%. Images were inadequate for LV wall motion assessment. However, no gross regional variation on available images including apical 4-chamber views. Doppler parameters are consistent with abnormal left ventricular relaxation (grade 1 diastolic dysfunction).   05/2015 MPI  Diffuse resting ST segment abnormalities.  Fixed inferior wall defect extending from apex to base with some inferoseptal involvement which is likely  due to soft tissue attenuation artifact. No ischemic territories.  This is a low risk study.  Nuclear stress EF: 55%.  06/2016 cath  Mid RCA lesion, 10 %stenosed.  Mid LAD lesion, 10 %stenosed.  Ost 2nd Diag to 2nd Diag lesion, 50 %stenosed.  Mid Cx to Dist Cx lesion, 10 %stenosed.  The left ventricular  systolic function is normal.  LV end diastolic pressure is moderately elevated.  The left ventricular ejection fraction is 55-65% by visual estimate.  There is no aortic valve stenosis.  No pulmonary artery hypertension. PA sat 64%. CO 4.6 L/min. CI 2.3.  FENO:  No results found for: NITRICOXIDE  PFT: No flowsheet data found.  Imaging: Dg Chest 2 View  Result Date: 11/07/2018 CLINICAL DATA:  Worsening SOB. Hx centrilobular emphysema/COPD, asthma, HTN. EXAM: CHEST - 2 VIEW COMPARISON:  10/12/2018 chest x-ray, 11/28/2015 CT FINDINGS: Lungs are markedly hyperinflated. There is perihilar peribronchial thickening. No focal consolidations or pleural effusions. Moderate degenerative changes in the midthoracic spine. IMPRESSION: 1. Hyperinflation and bronchitic changes. 2. No focal acute pulmonary abnormality. Electronically Signed   By: Nolon Nations M.D.   On: 11/07/2018 15:19      Specialty Problems      Pulmonary Problems   COPD exacerbation [facility])    September 2017 simple spirometry clear airflow obstruction, FEV1 0.92 L (32% predicted)      Centrilobular emphysema ([place])    Imaging: 12/2015 CXR images showing emphysema.    PFT: September 2017 simple spirometry clear airflow obstruction, FEV1 0.92 L (32% predicted)  Other tests: February 2017 nuclear stress test> "low risk study".  December 2016 echocardiogram LV EF 25-49%, grade 1 diastolic dysfunction   6 minute walk in October 2017 walk 288 m over the course of 4 minutes and 27 seconds. O2 saturation dropped no lower than 94%      DOE (dyspnea on exertion)   Chronic respiratory failure with hypoxia (HCC)      Allergies  Allergen Reactions   Brovana [Arformoterol] Shortness Of Breath   Pulmicort [Budesonide] Shortness Of Breath   Symbicort [Budesonide-Formoterol Fumarate] Other (See Comments)    headache    Immunization History  Administered Date(s) Administered   Influenza, High Dose Seasonal PF  01/14/2018   Pneumococcal Conjugate-13 12/25/2013   Pneumococcal Polysaccharide-23 04/16/2008    Past Medical History:  Diagnosis Date   Actinic keratosis    Asthma    COPD (chronic obstructive pulmonary disease) (HCC)    Hypertension    Insomnia    Seasonal allergies    Tobacco abuse     Tobacco History: Social History   Tobacco Use  Smoking Status Former Smoker   Packs/day: 1.00   Years: 57.00   Pack years: 57.00   Types: Cigarettes   Start date: 03/27/1958   Quit date: 12/16/2015   Years since quitting: 2.8  Smokeless Tobacco Never Used   Counseling given: Yes   Continue to not smoke  Outpatient Encounter Medications as of 11/07/2018  Medication Sig   albuterol (PROVENTIL HFA;VENTOLIN HFA) 108 (90 Base) MCG/ACT inhaler Inhale 2-4 puffs into the lungs every 6 (six) hours as needed for wheezing or shortness of breath.   albuterol (PROVENTIL) (2.5 MG/3ML) 0.083% nebulizer solution Take 2.5 mg by nebulization every 4 (four) hours as needed for wheezing or shortness of breath.    ALPRAZolam (XANAX) 1 MG tablet PATIENT TAKES 1/2 TABLET IN THE MORNING, EVENING AND 1 TABLET AT BEDTIME   b complex vitamins tablet Take 1 tablet by  mouth daily.   Biotin 1 MG CAPS Take 1 mg by mouth daily.   Cholecalciferol (VITAMIN D3 PO) Take 1 capsule by mouth daily.   citalopram (CELEXA) 40 MG tablet Take 40 mg by mouth daily.   Cyanocobalamin (B-12 SL) Place 1 tablet under the tongue daily.   doxycycline (VIBRA-TABS) 100 MG tablet Take 100 mg by mouth daily.   Fluticasone-Umeclidin-Vilant (TRELEGY ELLIPTA IN) Inhale 1 puff into the lungs every morning.    guaiFENesin (MUCINEX) 600 MG 12 hr tablet Take 600 mg by mouth 2 (two) times daily.   loratadine-pseudoephedrine (CLARITIN-D 12-HOUR) 5-120 MG tablet Take 1 tablet by mouth 2 (two) times daily as needed.    naproxen sodium (ANAPROX) 220 MG tablet Take 440 mg by mouth as needed (pain).   niacin 250 MG tablet  Take 250 mg by mouth at bedtime.   oxybutynin (DITROPAN-XL) 5 MG 24 hr tablet Take 5 mg by mouth at bedtime.   pantoprazole (PROTONIX) 40 MG tablet Take 40 mg by mouth daily.   predniSONE (DELTASONE) 10 MG tablet Take 2 tablets by mouth daily.    predniSONE (DELTASONE) 10 MG tablet Take 4 tablets daily (18m) for 7 days, then 3 tablets daily (382m for 7 days, then resume 2 tablets daily   tamsulosin (FLOMAX) 0.4 MG CAPS capsule Take 0.4 mg by mouth daily after supper.   aspirin EC 81 MG tablet Take 81 mg by mouth at bedtime.    DALIRESP 500 MCG TABS tablet Take 1 tablet by mouth daily.   Fluticasone-Umeclidin-Vilant (TRELEGY ELLIPTA) 100-62.5-25 MCG/INH AEPB Inhale 1 puff into the lungs daily.   losartan (COZAAR) 100 MG tablet Take 100 mg by mouth daily.   Multiple Vitamins-Minerals (VISION VITAMINS PO) Take 1 tablet by mouth 2 (two) times daily.   vitamin E 400 UNIT capsule Take 400 Units by mouth daily.   No facility-administered encounter medications on file as of 11/07/2018.      Review of Systems  Review of Systems  Constitutional: Positive for fatigue. Negative for activity change, diaphoresis and fever.  HENT: Positive for congestion. Negative for postnasal drip, sneezing, sore throat and trouble swallowing.   Eyes: Negative.   Respiratory: Positive for cough, shortness of breath and wheezing.   Cardiovascular: Positive for leg swelling. Negative for chest pain and palpitations.  Gastrointestinal: Negative for diarrhea, nausea and vomiting.  Endocrine: Negative.   Genitourinary: Negative.   Musculoskeletal: Negative.  Negative for arthralgias.  Skin: Negative.   Allergic/Immunologic: Negative.   Neurological: Negative.   Hematological: Negative.   Psychiatric/Behavioral: Negative.  Negative for dysphoric mood. The patient is not nervous/anxious.      Physical Exam  BP 126/78 (BP Location: Right Arm, Cuff Size: Normal)    Pulse 82    Temp 98 F (36.7 C) (Oral)     Ht _0  (1.727 m)    Wt 192 lb 12.8 oz (87.5 kg)    SpO2 98%    BMI 29.32 kg/m   Wt Readings from Last 5 Encounters:  11/07/18 192 lb 12.8 oz (87.5 kg)  12/17/17 174 lb 6.4 oz (79.1 kg)  06/04/17 186 lb 12.8 oz (84.7 kg)  03/22/17 184 lb (83.5 kg)  09/25/16 184 lb 3.2 oz (83.6 kg)   Weight elevated today  Patient on 2 L via nasal cannula  Physical Exam Vitals signs and nursing note reviewed.  Constitutional:      Appearance: Normal appearance. He is obese.  HENT:     Head: Normocephalic and  atraumatic.     Right Ear: Hearing, tympanic membrane, ear canal and external ear normal. There is no impacted cerumen.     Left Ear: Hearing, tympanic membrane, ear canal and external ear normal. There is no impacted cerumen.     Nose: Congestion and rhinorrhea present. No mucosal edema.     Right Turbinates: Not enlarged.     Left Turbinates: Not enlarged.     Mouth/Throat:     Mouth: Mucous membranes are dry.     Pharynx: Oropharynx is clear. No oropharyngeal exudate.  Eyes:     Pupils: Pupils are equal, round, and reactive to light.  Neck:     Musculoskeletal: Normal range of motion.  Cardiovascular:     Rate and Rhythm: Normal rate and regular rhythm.     Pulses: Normal pulses.     Heart sounds: Normal heart sounds. No murmur.  Pulmonary:     Effort: Pulmonary effort is normal.     Breath sounds: Decreased air movement present. Decreased breath sounds (throughout all fields ) present. No wheezing or rales.  Abdominal:     General: Bowel sounds are normal. There is no distension.     Palpations: Abdomen is soft.     Tenderness: There is no abdominal tenderness.  Musculoskeletal:     Right lower leg: Edema (1-2+ lower extremity edema) present.     Left lower leg: Edema (1-2+ lower extremity edema) present.  Lymphadenopathy:     Cervical: No cervical adenopathy.  Skin:    General: Skin is warm and dry.     Capillary Refill: Capillary refill takes less than 2 seconds.      Findings: Bruising and erythema present. No rash.  Neurological:     General: No focal deficit present.     Mental Status: He is alert and oriented to person, place, and time.     Motor: No weakness.     Coordination: Coordination normal.     Gait: Gait is intact. Gait normal.  Psychiatric:        Attention and Perception: Attention normal.        Mood and Affect: Mood is depressed. Affect is flat.        Behavior: Behavior is withdrawn. Behavior is cooperative.        Thought Content: Thought content normal.        Judgment: Judgment normal.      Lab Results:  CBC    Component Value Date/Time   WBC 9.8 01/02/2016 1314   RBC 4.48 01/02/2016 1314   HGB 14.3 01/02/2016 1314   HCT 41.2 01/02/2016 1314   PLT 309.0 01/02/2016 1314   MCV 92.1 01/02/2016 1314   MCHC 34.6 01/02/2016 1314   RDW 14.5 01/02/2016 1314   LYMPHSABS 2.0 01/02/2016 1314   MONOABS 0.7 01/02/2016 1314   EOSABS 0.4 01/02/2016 1314   BASOSABS 0.0 01/02/2016 1314    BMET    Component Value Date/Time   NA 133 (L) 11/07/2018 1516   K 4.4 11/07/2018 1516   CL 94 (L) 11/07/2018 1516   CO2 32 11/07/2018 1516   GLUCOSE 146 (H) 11/07/2018 1516   BUN 32 (H) 11/07/2018 1516   CREATININE 1.27 11/07/2018 1516   CALCIUM 9.4 11/07/2018 1516   GFRNONAA >60 04/27/2008 1239   GFRAA  04/27/2008 1239    >60        The eGFR has been calculated using the MDRD equation. This calculation has not been validated in all clinical situations.  eGFR's persistently <60 mL/min signify possible Chronic Kidney Disease.    BNP No results found for: BNP  ProBNP    Component Value Date/Time   PROBNP 50.0 01/02/2016 1314      Assessment & Plan:   Centrilobular emphysema (HCC) Assessment: 2017 simple office spirometry shows FEV1 of 0.92 (32% predicted) 2017 chest x-ray showing hyperinflation and suggesting emphysema No CT axial imaging Managed on Trelegy Ellipta, chronic prednisone, and doxycycline daily for  management of rosacea mMRC 4 today Severely deconditioned elderly male Severely diminished breath sounds throughout entire exam  Plan: Chest x-ray today Lab work today Attempted walk in office, patient was unable to tolerate, oxygen saturations stable Referral to home health for home physical therapy to work on physical conditioning Continue Trelegy Ellipta, samples provided today Referral to pharmacy for medication management as monthly Trelegy cost is $330 Pulmonary function test ordered Close follow-up with [name] to establish with him on 11/24/2018  Edema leg Assessment: Patient managed by Dr. Harl Bowie with cardiology Last office visit note in September/2019 mentions the patient should be resuming 25 mg of chlorthalidone daily Patient reports has been without his fluid pill for the last 2 to 3 months Patient does not weigh himself regularly Lower extremity swelling 1-2+ Severely deconditioned Weight elevated today 192 pounds  Plan: Patient to contact cardiology and schedule a office visit Lab work today Chest x-ray today Likely patient will need to resume diuretic dosing This is likely playing a role in the patient's worsened shortness of breath in combination with severe COPD  Former smoker Plan: Continue to not smoke  Chronic respiratory failure with hypoxia ([place]) Plan: Continue oxygen therapy as prescribed Pulmonary function test ordered  DOE (dyspnea on exertion) Plan: Lab work today Chest x-ray today Pulmonary function test ordered Close follow-up with [name] in August/2020 Referral to home health to help with physical activity and increase mobility and strength  Medication management Plan: Trelegy Ellipta samples provided today Referral to pharmacy team for help with Trelegy Ellipta causes patient is reporting 1 month cost $330 out of pocket [employer] patient assistance paperwork started today   Physical deconditioning Plan: Counseled on importance of  physical activity for COPD management Referral to home health for home physical therapy    Return in about 17 days (around 11/24/2018), or if symptoms worsen or fail to improve, for Follow up for PFT, Follow up with [name].   Lauraine Rinne, NP 11/07/2018   This appointment was 46 minutes long with over 50% of the time in direct face-to-face patient care, assessment, plan of care, and follow-up.

## 2018-11-08 LAB — PRO B NATRIURETIC PEPTIDE: NT-Pro BNP: 573 pg/mL — ABNORMAL HIGH (ref 0–486)

## 2018-11-10 ENCOUNTER — Telehealth: Payer: Self-pay | Admitting: Cardiology

## 2018-11-10 NOTE — Progress Notes (Signed)
See cxry note and labwork note.   B

## 2018-11-10 NOTE — Telephone Encounter (Signed)
Has been out of chlorthalidone 25 mg since November 2019 due to running out. Says he was told by pulmonology that he needed to restart it.

## 2018-11-10 NOTE — Progress Notes (Signed)
Lab work is relatively stable.  Slightly reduced kidney functioning. WBC elevated due to chronic prednisone. I would recommend the patient resume his recommended dose of chlorthalidone.  Patient needs to contact cardiology for a refill and schedule an office visit with them.  If he has difficulty obtaining this medication he needs to contact our office and we can give him a 30-day supply while he waits to get into see cardiology.  Please also route these results to Dr. Harl Bowie his cardiologist.  [name] FNP

## 2018-11-10 NOTE — Telephone Encounter (Signed)
°*  STAT* If patient is at the pharmacy, call can be transferred to refill team.   1. Which medications need to be refilled? Asking for refill - does not know name of medication??  Michela Pitcher a note was sent to Dr Harl Bowie in reference to this  2. Which pharmacy/location (including street and city if local pharmacy) is medication to be sent to?  3. Do they need a 30 day or 90 day supply?

## 2018-11-11 MED ORDER — TRELEGY ELLIPTA 100-62.5-25 MCG/INH IN AEPB: 1.0000 | INHALATION_SPRAY | Freq: Every day | RESPIRATORY_TRACT | 3 refills | Status: AC

## 2018-11-11 NOTE — Addendum Note (Signed)
Addended by: Parke Poisson E on: 11/11/2018 11:06 AM   Modules accepted: Orders

## 2018-11-11 NOTE — Telephone Encounter (Signed)
Ok to refill  J Artrice Kraker MD 

## 2018-11-12 ENCOUNTER — Telehealth: Payer: Self-pay | Admitting: Pharmacist

## 2018-11-12 DIAGNOSIS — Z299 Encounter for prophylactic measures, unspecified: Secondary | ICD-10-CM | POA: Diagnosis not present

## 2018-11-12 DIAGNOSIS — J449 Chronic obstructive pulmonary disease, unspecified: Secondary | ICD-10-CM | POA: Diagnosis not present

## 2018-11-12 DIAGNOSIS — F321 Major depressive disorder, single episode, moderate: Secondary | ICD-10-CM | POA: Diagnosis not present

## 2018-11-12 DIAGNOSIS — I10 Essential (primary) hypertension: Secondary | ICD-10-CM | POA: Diagnosis not present

## 2018-11-12 DIAGNOSIS — Z6829 Body mass index (BMI) 29.0-29.9, adult: Secondary | ICD-10-CM | POA: Diagnosis not present

## 2018-11-12 DIAGNOSIS — F419 Anxiety disorder, unspecified: Secondary | ICD-10-CM | POA: Diagnosis not present

## 2018-11-12 MED ORDER — CHLORTHALIDONE 25 MG PO TABS: 25.0000 mg | ORAL_TABLET | Freq: Every day | ORAL | 1 refills | Status: DC

## 2018-11-12 NOTE — Progress Notes (Signed)
Patient completed application for Trelegy, received by [place] 11/11/2018 [phone]), pending approval---$600 out of pocket proof required (e.g. printout from pharmacy), tried contacting patient to notify him of pickup from pharmacy but unable to reach. Left message to call back.

## 2018-11-12 NOTE — Telephone Encounter (Signed)
Chlorthalidone sent to pharmacy and patient informed.

## 2018-11-13 ENCOUNTER — Other Ambulatory Visit: Payer: Self-pay | Admitting: Pulmonary Disease

## 2018-11-13 DIAGNOSIS — Z87891 Personal history of nicotine dependence: Secondary | ICD-10-CM | POA: Diagnosis not present

## 2018-11-13 DIAGNOSIS — J45909 Unspecified asthma, uncomplicated: Secondary | ICD-10-CM | POA: Diagnosis not present

## 2018-11-13 DIAGNOSIS — G4700 Insomnia, unspecified: Secondary | ICD-10-CM | POA: Diagnosis not present

## 2018-11-13 DIAGNOSIS — J309 Allergic rhinitis, unspecified: Secondary | ICD-10-CM | POA: Diagnosis not present

## 2018-11-13 DIAGNOSIS — J9601 Acute respiratory failure with hypoxia: Secondary | ICD-10-CM | POA: Diagnosis not present

## 2018-11-13 DIAGNOSIS — J432 Centrilobular emphysema: Secondary | ICD-10-CM | POA: Diagnosis not present

## 2018-11-13 DIAGNOSIS — Z9181 History of falling: Secondary | ICD-10-CM | POA: Diagnosis not present

## 2018-11-13 DIAGNOSIS — Z7982 Long term (current) use of aspirin: Secondary | ICD-10-CM | POA: Diagnosis not present

## 2018-11-13 DIAGNOSIS — I10 Essential (primary) hypertension: Secondary | ICD-10-CM | POA: Diagnosis not present

## 2018-11-19 DIAGNOSIS — J449 Chronic obstructive pulmonary disease, unspecified: Secondary | ICD-10-CM | POA: Diagnosis not present

## 2018-11-19 DIAGNOSIS — I10 Essential (primary) hypertension: Secondary | ICD-10-CM | POA: Diagnosis not present

## 2018-11-19 NOTE — Telephone Encounter (Signed)
11/19/18 Reached out to pt regarding Trelegy application and informed him that he would need to go to his pharmacy to pick up his prescription record. Pt stated he understood and would pick it up shortly.

## 2018-11-20 ENCOUNTER — Other Ambulatory Visit: Payer: Self-pay

## 2018-11-20 ENCOUNTER — Other Ambulatory Visit (HOSPITAL_COMMUNITY)
Admission: RE | Admit: 2018-11-20 | Discharge: 2018-11-20 | Disposition: A | Discharge status: Home / Self Care | Payer: Medicare Other | Source: Ambulatory Visit | Attending: Pulmonary Disease | Admitting: Pulmonary Disease

## 2018-11-20 DIAGNOSIS — Z01812 Encounter for preprocedural laboratory examination: Secondary | ICD-10-CM | POA: Diagnosis not present

## 2018-11-20 DIAGNOSIS — Z20828 Contact with and (suspected) exposure to other viral communicable diseases: Secondary | ICD-10-CM | POA: Insufficient documentation

## 2018-11-21 DIAGNOSIS — J309 Allergic rhinitis, unspecified: Secondary | ICD-10-CM | POA: Diagnosis not present

## 2018-11-21 DIAGNOSIS — G4700 Insomnia, unspecified: Secondary | ICD-10-CM | POA: Diagnosis not present

## 2018-11-21 DIAGNOSIS — J432 Centrilobular emphysema: Secondary | ICD-10-CM | POA: Diagnosis not present

## 2018-11-21 DIAGNOSIS — I10 Essential (primary) hypertension: Secondary | ICD-10-CM | POA: Diagnosis not present

## 2018-11-21 DIAGNOSIS — J45909 Unspecified asthma, uncomplicated: Secondary | ICD-10-CM | POA: Diagnosis not present

## 2018-11-21 DIAGNOSIS — J9601 Acute respiratory failure with hypoxia: Secondary | ICD-10-CM | POA: Diagnosis not present

## 2018-11-21 LAB — SARS CORONAVIRUS 2 (TAT 6-24 HRS): SARS Coronavirus 2: NEGATIVE

## 2018-11-24 ENCOUNTER — Ambulatory Visit (INDEPENDENT_AMBULATORY_CARE_PROVIDER_SITE_OTHER): Payer: Medicare Other | Admitting: Pulmonary Disease

## 2018-11-24 ENCOUNTER — Other Ambulatory Visit: Payer: Self-pay

## 2018-11-24 ENCOUNTER — Encounter: Payer: Self-pay | Admitting: Pulmonary Disease

## 2018-11-24 VITALS — BP 162/88 | HR 104 | Temp 98.1°F | Ht 68.0 in | Wt 192.0 lb

## 2018-11-24 DIAGNOSIS — J9611 Chronic respiratory failure with hypoxia: Secondary | ICD-10-CM | POA: Diagnosis not present

## 2018-11-24 DIAGNOSIS — Z66 Do not resuscitate: Secondary | ICD-10-CM | POA: Insufficient documentation

## 2018-11-24 DIAGNOSIS — J449 Chronic obstructive pulmonary disease, unspecified: Secondary | ICD-10-CM | POA: Diagnosis not present

## 2018-11-24 DIAGNOSIS — J432 Centrilobular emphysema: Secondary | ICD-10-CM | POA: Diagnosis not present

## 2018-11-24 LAB — PULMONARY FUNCTION TEST
DL/VA % pred: 83 %
DL/VA: 3.3 ml/min/mmHg/L
DLCO cor % pred: 51 %
DLCO cor: 11.93 ml/min/mmHg
DLCO unc % pred: 49 %
DLCO unc: 11.58 ml/min/mmHg
FEF 25-75 Post: 0.26 L/sec
FEF 25-75 Pre: 0.25 L/sec
FEF2575-%Change-Post: 4 %
FEF2575-%Pred-Post: 13 %
FEF2575-%Pred-Pre: 13 %
FEV1-%Change-Post: -2 %
FEV1-%Pred-Post: 23 %
FEV1-%Pred-Pre: 23 %
FEV1-Post: 0.62 L
FEV1-Pre: 0.64 L
FEV1FVC-%Change-Post: -4 %
FEV1FVC-%Pred-Pre: 59 %
FEV6-%Change-Post: 2 %
FEV6-%Pred-Post: 41 %
FEV6-%Pred-Pre: 41 %
FEV6-Post: 1.48 L
FEV6-Pre: 1.45 L
FEV6FVC-%Change-Post: 0 %
FEV6FVC-%Pred-Post: 103 %
FEV6FVC-%Pred-Pre: 103 %
FVC-%Change-Post: 1 %
FVC-%Pred-Post: 40 %
FVC-%Pred-Pre: 39 %
FVC-Post: 1.53 L
FVC-Pre: 1.5 L
Post FEV1/FVC ratio: 41 %
Post FEV6/FVC ratio: 97 %
Pre FEV1/FVC ratio: 43 %
Pre FEV6/FVC Ratio: 96 %
RV % pred: 389 %
RV: 9.76 L
TLC % pred: 175 %
TLC: 11.67 L

## 2018-11-24 NOTE — Patient Instructions (Addendum)
Thank you for visiting [name] at [facility]. Today we recommend the following:  Orders Placed This Encounter  Procedures  . Ambulatory referral to Hospice   Drop prednisone dosing to 10mg  daily    Please do your part to reduce the spread of COVID-19.

## 2018-11-24 NOTE — Progress Notes (Signed)
Full PFT performed today. Pt is normally on oxygen 24/7. Pt took oxygen off between each test.

## 2018-11-24 NOTE — Progress Notes (Signed)
Synopsis: Referred in Aug 2020 for COPD, [facility] by Glenda Chroman, MD  Subjective:   PATIENT ID: Dominic Willis GENDER: male DOB: 03-19-1941, MRN: [id number]  Chief Complaint  Patient presents with  . Follow-up    PFT today. Using trelegy daily and albuterol prn.     78 year old gentleman with acute on chronic hypoxemic respiratory failure requiring nasal cannula O2 supplementation, advanced COPD with an FEV1 of 600 cc.  Pulmonary function test completed today.  Patient had a hospitalization in [place] during the month of April and May.  He has had continued decline and ongoing trouble with respiratory complaints at home.  He remains short of breath at baseline.  He has a fear of dying in the hospital.  We discussed this today in the hot office.  He would prefer to not go back to the hospital if he did not have to.  He understands that his disease is slowly taken his life and he would prefer to try to have better quality of life of the next several months versus going in and out of the hospital.  I do think that he should continue use of his nebulizer medication scheduled at home.  He is not taking much advantage of the use of his nebs.  Today we also discussed the utility of hospice care with advanced lung disease.  He is open to this concept.  His wife was present in the office today for this discussion.  He is agreeable that his disease is slowly causing him to suffer.  He does believe that having hospice care at home with assistance and people coming to help check on him and manage his symptoms would be appropriate.  Today we also discussed his CODE STATUS.  Would recommend that he become a durable DNR as well as filling out of a [place] form.  This was completed today in the office.   Past Medical History:  Diagnosis Date  . Actinic keratosis   . Asthma   . COPD (chronic obstructive pulmonary disease) ([place])   . Hypertension   . Insomnia   . Seasonal allergies   .  Tobacco abuse      Family History  Problem Relation Age of Onset  . Heart disease Father   . Heart disease Mother      Past Surgical History:  Procedure Laterality Date  . BREAST BIOPSY Left   . INGUINAL HERNIA REPAIR    . LUMBAR LAMINECTOMY     Dr. Joya Salm  . RIGHT/LEFT HEART CATH AND CORONARY ANGIOGRAPHY N/A 07/10/2016   Procedure: Right/Left Heart Cath and Coronary Angiography;  Surgeon: Jettie Booze, MD;  Location: [facility];  Service: Cardiovascular;  Laterality: N/A;  . SHOULDER SURGERY Left     Social History   Socioeconomic History  . Marital status: Married    Spouse name: Not on file  . Number of children: Not on file  . Years of education: Not on file  . Highest education level: Not on file  Occupational History  . Not on file  Social Needs  . Financial resource strain: Not on file  . Food insecurity    Worry: Not on file    Inability: Not on file  . Transportation needs    Medical: Not on file    Non-medical: Not on file  Tobacco Use  . Smoking status: Former Smoker    Packs/day: 1.00    Years: 57.00    Pack years: 57.00  Types: Cigarettes    Start date: 03/27/1958    Quit date: 12/16/2015    Years since quitting: 2.9  . Smokeless tobacco: Never Used  Substance and Sexual Activity  . Alcohol use: Not on file  . Drug use: Not on file  . Sexual activity: Not on file  Lifestyle  . Physical activity    Days per week: Not on file    Minutes per session: Not on file  . Stress: Not on file  Relationships  . Social Herbalist on phone: Not on file    Gets together: Not on file    Attends religious service: Not on file    Active member of club or organization: Not on file    Attends meetings of clubs or organizations: Not on file    Relationship status: Not on file  . Intimate partner violence    Fear of current or ex partner: Not on file    Emotionally abused: Not on file    Physically abused: Not on file    Forced  sexual activity: Not on file  Other Topics Concern  . Not on file  Social History Narrative  . Not on file     Allergies  Allergen Reactions  . Brovana [Arformoterol] Shortness Of Breath  . Pulmicort [Budesonide] Shortness Of Breath  . Symbicort [Budesonide-Formoterol Fumarate] Other (See Comments)    headache     Outpatient Medications Prior to Visit  Medication Sig Dispense Refill  . albuterol (PROVENTIL HFA;VENTOLIN HFA) 108 (90 Base) MCG/ACT inhaler Inhale 2-4 puffs into the lungs every 6 (six) hours as needed for wheezing or shortness of breath.    Marland Kitchen albuterol (PROVENTIL) (2.5 MG/3ML) 0.083% nebulizer solution Take 2.5 mg by nebulization every 4 (four) hours as needed for wheezing or shortness of breath.     . ALPRAZolam (XANAX) 1 MG tablet PATIENT TAKES 1/2 TABLET IN THE MORNING, EVENING AND 1 TABLET AT BEDTIME    . aspirin EC 81 MG tablet Take 81 mg by mouth at bedtime.     Marland Kitchen b complex vitamins tablet Take 1 tablet by mouth daily.    . Biotin 1 MG CAPS Take 1 mg by mouth daily.    . chlorthalidone (HYGROTON) 25 MG tablet Take 1 tablet (25 mg total) by mouth daily. 30 tablet 1  . Cholecalciferol (VITAMIN D3 PO) Take 1 capsule by mouth daily.    . citalopram (CELEXA) 40 MG tablet Take 40 mg by mouth daily.    . Cyanocobalamin (B-12 SL) Place 1 tablet under the tongue daily.    Marland Kitchen DALIRESP 500 MCG TABS tablet Take 1 tablet by mouth daily.    Marland Kitchen doxycycline (VIBRA-TABS) 100 MG tablet Take 100 mg by mouth daily.    . Fluticasone-Umeclidin-Vilant (TRELEGY ELLIPTA) 100-62.5-25 MCG/INH AEPB Inhale 1 puff into the lungs daily. 3 each 3  . guaiFENesin (MUCINEX) 600 MG 12 hr tablet Take 600 mg by mouth 2 (two) times daily.    Marland Kitchen loratadine-pseudoephedrine (CLARITIN-D 12-HOUR) 5-120 MG tablet Take 1 tablet by mouth 2 (two) times daily as needed.     Marland Kitchen losartan (COZAAR) 100 MG tablet Take 100 mg by mouth daily.    . Multiple Vitamins-Minerals (VISION VITAMINS PO) Take 1 tablet by mouth 2  (two) times daily.    . naproxen sodium (ANAPROX) 220 MG tablet Take 440 mg by mouth as needed (pain).    . niacin 250 MG tablet Take 250 mg by mouth at bedtime.    Marland Kitchen  oxybutynin (DITROPAN-XL) 5 MG 24 hr tablet Take 5 mg by mouth at bedtime.    . pantoprazole (PROTONIX) 40 MG tablet Take 40 mg by mouth daily.    . predniSONE (DELTASONE) 10 MG tablet Take 2 tablets by mouth daily.     . predniSONE (DELTASONE) 10 MG tablet Take 4 tablets daily (40mg ) for 7 days, then 3 tablets daily (30mg ) for 7 days, then resume 2 tablets daily 49 tablet 0  . tamsulosin (FLOMAX) 0.4 MG CAPS capsule Take 0.4 mg by mouth daily after supper.    . vitamin E 400 UNIT capsule Take 400 Units by mouth daily.     No facility-administered medications prior to visit.     Review of Systems  Constitutional: Negative for chills, fever, malaise/fatigue and weight loss.  HENT: Negative for hearing loss, sore throat and tinnitus.   Eyes: Negative for blurred vision and double vision.  Respiratory: Positive for cough and shortness of breath. Negative for hemoptysis, sputum production, wheezing and stridor.   Cardiovascular: Negative for chest pain, palpitations, orthopnea, leg swelling and PND.  Gastrointestinal: Negative for abdominal pain, constipation, diarrhea, heartburn, nausea and vomiting.  Genitourinary: Negative for dysuria, hematuria and urgency.  Musculoskeletal: Negative for joint pain and myalgias.  Skin: Negative for itching and rash.  Neurological: Negative for dizziness, tingling, weakness and headaches.  Endo/Heme/Allergies: Negative for environmental allergies. Does not bruise/bleed easily.  Psychiatric/Behavioral: Negative for depression. The patient is not nervous/anxious and does not have insomnia.   All other systems reviewed and are negative.    Objective:  Physical Exam Vitals signs reviewed.  Constitutional:      General: He is not in acute distress.    Appearance: He is well-developed.   HENT:     Head: Normocephalic and atraumatic.     Mouth/Throat:     Pharynx: No oropharyngeal exudate.  Eyes:     Conjunctiva/sclera: Conjunctivae normal.     Pupils: Pupils are equal, round, and reactive to light.  Neck:     Vascular: No JVD.     Trachea: No tracheal deviation.     Comments: Loss of supraclavicular fat Cardiovascular:     Rate and Rhythm: Normal rate and regular rhythm.     Heart sounds: S1 normal and S2 normal.     Comments: Distant heart tones Pulmonary:     Effort: No tachypnea or accessory muscle usage.     Breath sounds: No stridor. Decreased breath sounds (throughout all lung fields) present. No wheezing, rhonchi or rales.  Abdominal:     General: Bowel sounds are normal. There is no distension.     Palpations: Abdomen is soft.     Tenderness: There is no abdominal tenderness.  Musculoskeletal:        General: Deformity (muscle wasting ) present.  Skin:    General: Skin is warm and dry.     Capillary Refill: Capillary refill takes less than 2 seconds.     Findings: No rash.  Neurological:     Mental Status: He is alert and oriented to person, place, and time.  Psychiatric:        Behavior: Behavior normal.      Vitals:   11/24/18 1505  BP: (!) 162/88  Pulse: (!) 104  Temp: 98.1 F (36.7 C)  TempSrc: Oral  SpO2: 98%  Weight: 192 lb (87.1 kg)  Height: 5\' 8"  (1.727 m)   98% on RA BMI Readings from Last 3 Encounters:  11/24/18 29.19 kg/m  11/07/18  29.32 kg/m  12/17/17 26.13 kg/m   Wt Readings from Last 3 Encounters:  11/24/18 192 lb (87.1 kg)  11/07/18 192 lb 12.8 oz (87.5 kg)  12/17/17 174 lb 6.4 oz (79.1 kg)     CBC    Component Value Date/Time   WBC 11.8 (H) 11/07/2018 1515   RBC 4.44 11/07/2018 1515   HGB 13.6 11/07/2018 1515   HCT 41.2 11/07/2018 1515   PLT 265.0 11/07/2018 1515   MCV 92.9 11/07/2018 1515   MCHC 33.0 11/07/2018 1515   RDW 14.1 11/07/2018 1515   LYMPHSABS 0.4 (L) 11/07/2018 1515   MONOABS 0.4  11/07/2018 1515   EOSABS 0.0 11/07/2018 1515   BASOSABS 0.0 11/07/2018 1515    Chest Imaging: 11/07/2018: Hyperinflation chest x-ray reviewed, infiltrate The patient's images have been independently reviewed by me.    Pulmonary Functions Testing Results: PFT Results Latest Ref Rng & Units 11/24/2018  FVC-Pre L 1.50  FVC-Predicted Pre % 39  FVC-Post L 1.53  FVC-Predicted Post % 40  Pre FEV1/FVC % % 43  Post FEV1/FCV % % 41  FEV1-Pre L 0.64  FEV1-Predicted Pre % 23  FEV1-Post L 0.62  DLCO UNC% % 49  DLCO COR %Predicted % 83  TLC L 11.67  TLC % Predicted % 175  RV % Predicted % 389    FeNO: None   Pathology: None   Echocardiogram: None  Heart Catheterization: None     Assessment & Plan:     ICD-10-CM   1. Chronic respiratory failure with hypoxia ([place])  J96.11 Ambulatory referral to Hospice  2. Stage 4 very severe COPD by GOLD classification ([place])  J44.9   3. DNR (do not resuscitate)  Z66     Discussion:  78 year old gentleman with very severe COPD, FEV1 23% predicted, 600 cc FEV1.  Significant baseline chronic hypoxemic respiratory failure currently on nasal cannula O2. Today in the office we discussed goals of care.  At this point we will fill out durable DNR forms in the office We will also complete a [place] form  22 minutes of time was spent for advanced care planning having this discussion regarding CODE STATUS as well as completing of the forms.  Copies of the forms will be sent with the patient.  The patient was also advised to obtain a medical alert bracelet for DNR status.  As for his current medication regimen I think he can continue on his current inhaler regimen.  Would recommend him using his nebulizer more often if he is feeling significant shortness of breath.  A referral was placed for home hospice for evaluation in the home.  I will be happy to service as the patient's hospice physician if desired.  He should continue his current  prednisone regimen.  I think he can release drop down to 10 mg daily.  He will likely be on chronic prednisone for symptom management for life.  Greater than 50% of this patient's 55-minute office visit was been face-to-face discussing above recommendations and treatment plan.  Separate of the patient's time spent were advanced care planning as documented above.    Current Outpatient Medications:  .  albuterol (PROVENTIL HFA;VENTOLIN HFA) 108 (90 Base) MCG/ACT inhaler, Inhale 2-4 puffs into the lungs every 6 (six) hours as needed for wheezing or shortness of breath., Disp: , Rfl:  .  albuterol (PROVENTIL) (2.5 MG/3ML) 0.083% nebulizer solution, Take 2.5 mg by nebulization every 4 (four) hours as needed for wheezing or shortness of breath. ,  Disp: , Rfl:  .  ALPRAZolam (XANAX) 1 MG tablet, PATIENT TAKES 1/2 TABLET IN THE MORNING, EVENING AND 1 TABLET AT BEDTIME, Disp: , Rfl:  .  aspirin EC 81 MG tablet, Take 81 mg by mouth at bedtime. , Disp: , Rfl:  .  b complex vitamins tablet, Take 1 tablet by mouth daily., Disp: , Rfl:  .  Biotin 1 MG CAPS, Take 1 mg by mouth daily., Disp: , Rfl:  .  chlorthalidone (HYGROTON) 25 MG tablet, Take 1 tablet (25 mg total) by mouth daily., Disp: 30 tablet, Rfl: 1 .  Cholecalciferol (VITAMIN D3 PO), Take 1 capsule by mouth daily., Disp: , Rfl:  .  citalopram (CELEXA) 40 MG tablet, Take 40 mg by mouth daily., Disp: , Rfl:  .  Cyanocobalamin (B-12 SL), Place 1 tablet under the tongue daily., Disp: , Rfl:  .  DALIRESP 500 MCG TABS tablet, Take 1 tablet by mouth daily., Disp: , Rfl:  .  doxycycline (VIBRA-TABS) 100 MG tablet, Take 100 mg by mouth daily., Disp: , Rfl:  .  Fluticasone-Umeclidin-Vilant (TRELEGY ELLIPTA) 100-62.5-25 MCG/INH AEPB, Inhale 1 puff into the lungs daily., Disp: 3 each, Rfl: 3 .  guaiFENesin (MUCINEX) 600 MG 12 hr tablet, Take 600 mg by mouth 2 (two) times daily., Disp: , Rfl:  .  loratadine-pseudoephedrine (CLARITIN-D 12-HOUR) 5-120 MG tablet,  Take 1 tablet by mouth 2 (two) times daily as needed. , Disp: , Rfl:  .  losartan (COZAAR) 100 MG tablet, Take 100 mg by mouth daily., Disp: , Rfl:  .  Multiple Vitamins-Minerals (VISION VITAMINS PO), Take 1 tablet by mouth 2 (two) times daily., Disp: , Rfl:  .  naproxen sodium (ANAPROX) 220 MG tablet, Take 440 mg by mouth as needed (pain)., Disp: , Rfl:  .  niacin 250 MG tablet, Take 250 mg by mouth at bedtime., Disp: , Rfl:  .  oxybutynin (DITROPAN-XL) 5 MG 24 hr tablet, Take 5 mg by mouth at bedtime., Disp: , Rfl:  .  pantoprazole (PROTONIX) 40 MG tablet, Take 40 mg by mouth daily., Disp: , Rfl:  .  predniSONE (DELTASONE) 10 MG tablet, Take 2 tablets by mouth daily. , Disp: , Rfl:  .  predniSONE (DELTASONE) 10 MG tablet, Take 4 tablets daily (40mg ) for 7 days, then 3 tablets daily (30mg ) for 7 days, then resume 2 tablets daily, Disp: 49 tablet, Rfl: 0 .  tamsulosin (FLOMAX) 0.4 MG CAPS capsule, Take 0.4 mg by mouth daily after supper., Disp: , Rfl:  .  vitamin E 400 UNIT capsule, Take 400 Units by mouth daily., Disp: , Rfl:    Garner Nash, DO [facility] 11/24/2018 3:39 PM

## 2018-11-24 NOTE — Progress Notes (Signed)
Discussed in office with [name].  Nothing further is needed.  Aaron Edelman

## 2018-11-25 DIAGNOSIS — J309 Allergic rhinitis, unspecified: Secondary | ICD-10-CM | POA: Diagnosis not present

## 2018-11-25 DIAGNOSIS — J45909 Unspecified asthma, uncomplicated: Secondary | ICD-10-CM | POA: Diagnosis not present

## 2018-11-25 DIAGNOSIS — J9601 Acute respiratory failure with hypoxia: Secondary | ICD-10-CM | POA: Diagnosis not present

## 2018-11-25 DIAGNOSIS — G4700 Insomnia, unspecified: Secondary | ICD-10-CM | POA: Diagnosis not present

## 2018-11-25 DIAGNOSIS — J432 Centrilobular emphysema: Secondary | ICD-10-CM | POA: Diagnosis not present

## 2018-11-25 DIAGNOSIS — I10 Essential (primary) hypertension: Secondary | ICD-10-CM | POA: Diagnosis not present

## 2018-11-26 DIAGNOSIS — Z66 Do not resuscitate: Secondary | ICD-10-CM | POA: Diagnosis not present

## 2018-11-26 DIAGNOSIS — J441 Chronic obstructive pulmonary disease with (acute) exacerbation: Secondary | ICD-10-CM | POA: Diagnosis not present

## 2018-11-26 DIAGNOSIS — Z515 Encounter for palliative care: Secondary | ICD-10-CM | POA: Diagnosis not present

## 2018-11-26 DIAGNOSIS — I10 Essential (primary) hypertension: Secondary | ICD-10-CM | POA: Diagnosis not present

## 2018-11-26 DIAGNOSIS — Z720 Tobacco use: Secondary | ICD-10-CM | POA: Diagnosis not present

## 2018-11-26 DIAGNOSIS — Z9981 Dependence on supplemental oxygen: Secondary | ICD-10-CM | POA: Diagnosis not present

## 2018-11-26 DIAGNOSIS — R609 Edema, unspecified: Secondary | ICD-10-CM | POA: Diagnosis not present

## 2018-11-26 DIAGNOSIS — L570 Actinic keratosis: Secondary | ICD-10-CM | POA: Diagnosis not present

## 2018-11-26 NOTE — Progress Notes (Signed)
Cardiology Office Note   Date:  11/27/2018   ID:  [name], DOB 1940-11-16, MRN [id number]  PCP:  Glenda Chroman, MD Cardiologist:  Carlyle Dolly, MD 12/17/2017 Dominic Ferries, PA-C   No chief complaint on file.   History of Present Illness: Dominic Willis is a 78 y.o. male with a history of DOE, COPD/asthma, tob abuse, non-obs dz by cath 2018, hypertension, grade 1 diastolic dysfunction on echo  [name] presents for cardiology follow up.  Today is a bad day. His breathing is bad, woke up like that. He is working harder to breathe.   Has been having more LE edema. He had been out of the chlorthalidone for several months. Got a refill 11/12/2018, has been generally better, but still waking w/ LE edema at times. Not weighing daily but can. Will start with dry wt 190 lbs, see how he does.   Gets a lot of salt in foods, [name] bowl has 900 mg). Likes to add salt in foods.   On 2 lpm home O2 chronically, 3 lpm w/ exertion.   Sometimes wakes in the night w/ SOB. Mainly wakes to urinate, but getting up makes him very SOB.   Gets chest pain when he breathes hard. Worse w/ deep inspiration.  The shortness of breath always comes first, then the chest pain.  Tell [name] 8/10, to follow-up on PFTs.  His FEV1 was 23% of predicted, 600 cc.  He was felt to have significant chronic hypoxemic respiratory failure and goals of care were discussed.  He was made a DNR and was referred to hospice.  They have already been in contact with the patient and his wife.  The patient says he was told that he has 6 months to a year to live.   Past Medical History:  Diagnosis Date  . Actinic keratosis   . Asthma   . COPD (chronic obstructive pulmonary disease) ([place])   . Hypertension   . Insomnia   . Seasonal allergies   . Tobacco abuse     Past Surgical History:  Procedure Laterality Date  . BREAST BIOPSY Left   . INGUINAL HERNIA REPAIR    . LUMBAR LAMINECTOMY     Dr. Joya Salm  . RIGHT/LEFT HEART CATH AND CORONARY ANGIOGRAPHY N/A 07/10/2016   Procedure: Right/Left Heart Cath and Coronary Angiography;  Surgeon: Jettie Booze, MD;  Location: [facility];  Service: Cardiovascular;  Laterality: N/A;  . SHOULDER SURGERY Left     Current Outpatient Medications  Medication Sig Dispense Refill  . albuterol (PROVENTIL HFA;VENTOLIN HFA) 108 (90 Base) MCG/ACT inhaler Inhale 2-4 puffs into the lungs every 6 (six) hours as needed for wheezing or shortness of breath.    Marland Kitchen albuterol (PROVENTIL) (2.5 MG/3ML) 0.083% nebulizer solution Take 2.5 mg by nebulization every 4 (four) hours as needed for wheezing or shortness of breath.     . ALPRAZolam (XANAX) 1 MG tablet PATIENT TAKES 1/2 TABLET IN THE MORNING, EVENING AND 1 TABLET AT BEDTIME    . aspirin EC 81 MG tablet Take 81 mg by mouth at bedtime.     Marland Kitchen b complex vitamins tablet Take 1 tablet by mouth daily.    . Biotin 1 MG CAPS Take 1 mg by mouth daily.    . chlorthalidone (HYGROTON) 25 MG tablet Take 1 tablet (25 mg total) by mouth daily. May take an extra tablet for weight gain of 3 lbs in 24 hours or  5 lbs in one week 60 tablet 6  . Cholecalciferol (VITAMIN D3 PO) Take 1 capsule by mouth daily.    . citalopram (CELEXA) 40 MG tablet Take 40 mg by mouth daily.    . Cyanocobalamin (B-12 SL) Place 1 tablet under the tongue daily.    Marland Kitchen DALIRESP 500 MCG TABS tablet Take 1 tablet by mouth daily.    Marland Kitchen doxycycline (VIBRA-TABS) 100 MG tablet Take 100 mg by mouth daily.    . Fluticasone-Umeclidin-Vilant (TRELEGY ELLIPTA) 100-62.5-25 MCG/INH AEPB Inhale 1 puff into the lungs daily. 3 each 3  . guaiFENesin (MUCINEX) 600 MG 12 hr tablet Take 600 mg by mouth 2 (two) times daily.    Marland Kitchen loratadine-pseudoephedrine (CLARITIN-D 12-HOUR) 5-120 MG tablet Take 1 tablet by mouth 2 (two) times daily as needed.     Marland Kitchen losartan (COZAAR) 100 MG tablet Take 100 mg by mouth daily.    . Multiple Vitamins-Minerals (VISION VITAMINS PO)  Take 1 tablet by mouth 2 (two) times daily.    . naproxen sodium (ANAPROX) 220 MG tablet Take 440 mg by mouth as needed (pain).    . niacin 250 MG tablet Take 250 mg by mouth at bedtime.    Marland Kitchen oxybutynin (DITROPAN-XL) 5 MG 24 hr tablet Take 5 mg by mouth at bedtime.    . pantoprazole (PROTONIX) 40 MG tablet Take 40 mg by mouth daily.    . predniSONE (DELTASONE) 10 MG tablet Take 4 tablets daily (40mg ) for 7 days, then 3 tablets daily (30mg ) for 7 days, then resume 2 tablets daily 49 tablet 0  . tamsulosin (FLOMAX) 0.4 MG CAPS capsule Take 0.4 mg by mouth daily after supper.    . vitamin E 400 UNIT capsule Take 400 Units by mouth daily.     No current facility-administered medications for this visit.     Allergies:   Brovana [arformoterol], Pulmicort [budesonide], and Symbicort [budesonide-formoterol fumarate]    Social History:  The patient  reports that he quit smoking about 2 years ago. His smoking use included cigarettes. He started smoking about 60 years ago. He has a 57.00 pack-year smoking history. He has never used smokeless tobacco.   Family History:  The patient's family history includes Heart disease in his father and mother.  He indicated that his mother is deceased. He indicated that his father is deceased.   ROS:  Please see the history of present illness. All other systems are reviewed and negative.    PHYSICAL EXAM: VS:  BP (!) 146/81   Pulse 99   Ht 5\' 8"  (1.727 m)   Wt 191 lb 6.4 oz (86.8 kg)   SpO2 97%   BMI 29.10 kg/m  , BMI Body mass index is 29.1 kg/m. GEN: Well nourished, well developed, male in no acute distress HEENT: normal for age  Neck: no JVD seen, no carotid bruit, no masses Cardiac: RRR; no murmur, no rubs, or gallops Respiratory: Significantly decreased breath sounds bilaterally, increased work of breathing GI: soft, nontender, nondistended, + BS MS: no deformity or atrophy; trace pedal edema; distal pulses are 2+ in all 4 extremities  Skin: warm  and dry, no rash Neuro:  Strength and sensation are intact Psych: euthymic mood, full affect   EKG:  EKG is ordered today. The ekg ordered today demonstrates sinus rhythm, heart rate 98, diffuse inferolateral ST abnormalities that are similar to previous ECG  ECHO: 04/11/2015 - Procedure narrative: Transthoracic echocardiography for left   ventricular function evaluation, for right ventricular  function   evaluation, and for assessment of valvular function. Image   quality was adequate. The study was technically difficult, as a   result of poor sound wave transmission. - Left ventricle: The cavity size was normal. There was mild   concentric hypertrophy. Systolic function was normal. The   estimated ejection fraction was in the range of 60% to 65%.   Images were inadequate for LV wall motion assessment. However, no   gross regional variation on available images including apical   4-chamber views. Doppler parameters are consistent with abnormal   left ventricular relaxation (grade 1 diastolic dysfunction).  CATH: 07/10/2016  Mid RCA lesion, 10 %stenosed.  Mid LAD lesion, 10 %stenosed.  Ost 2nd Diag to 2nd Diag lesion, 50 %stenosed.  Mid Cx to Dist Cx lesion, 10 %stenosed.  The left ventricular systolic function is normal.  LV end diastolic pressure is moderately elevated.  The left ventricular ejection fraction is 55-65% by visual estimate.  There is no aortic valve stenosis.  No pulmonary artery hypertension. PA sat 64%. CO 4.6 L/min. CI 2.3.   Mild volume overload.  Consider treatment with diuretic.    MONITOR: 03/29/2017  7 day event monitor  No symptoms reported  Telemetry tracings show sinus rhythm with rare PVCs   Recent Labs: 11/07/2018: ALT 20; BUN 32; Creatinine, Ser 1.27; Hemoglobin 13.6; NT-Pro BNP 573; Platelets 265.0; Potassium 4.4; Sodium 133  CBC    Component Value Date/Time   WBC 11.8 (H) 11/07/2018 1515   RBC 4.44 11/07/2018 1515   HGB 13.6  11/07/2018 1515   HCT 41.2 11/07/2018 1515   PLT 265.0 11/07/2018 1515   MCV 92.9 11/07/2018 1515   MCHC 33.0 11/07/2018 1515   RDW 14.1 11/07/2018 1515   LYMPHSABS 0.4 (L) 11/07/2018 1515   MONOABS 0.4 11/07/2018 1515   EOSABS 0.0 11/07/2018 1515   BASOSABS 0.0 11/07/2018 1515   CMP Latest Ref Rng & Units 11/07/2018 01/02/2016 04/27/2008  Glucose 70 - 99 mg/dL 146(H) 107(H) 92  BUN 6 - 23 mg/dL 32(H) 15 9  Creatinine 0.40 - 1.50 mg/dL 1.27 1.00 0.82  Sodium 135 - 145 mEq/L 133(L) 136 141  Potassium 3.5 - 5.1 mEq/L 4.4 3.8 4.5  Chloride 96 - 112 mEq/L 94(L) 98 106  CO2 19 - 32 mEq/L 32 31 28  Calcium 8.4 - 10.5 mg/dL 9.4 9.0 10.2  Total Protein 6.0 - 8.3 g/dL 6.3 - -  Total Bilirubin 0.2 - 1.2 mg/dL 0.5 - -  Alkaline Phos 39 - 117 U/L 52 - -  AST 0 - 37 U/L 17 - -  ALT 0 - 53 U/L 20 - -     Lipid Panel No results found for: CHOL, HDL, LDLCALC, LDLDIRECT, TRIG, CHOLHDL    Wt Readings from Last 3 Encounters:  11/27/18 191 lb 6.4 oz (86.8 kg)  11/24/18 192 lb (87.1 kg)  11/07/18 192 lb 12.8 oz (87.5 kg)     Other studies Reviewed: Additional studies/ records that were reviewed today include: Office notes, hospital records and testing.  ASSESSMENT AND PLAN:  1.  Chronic diastolic CHF: - After he was restarted on the chlorthalidone, his lower extremity edema has improved.  No recent labs, check a BMET today. -He is encouraged to start weighing daily and report gains of 3 pounds in a day or 5 pounds in a week. - We discussed options to decrease the amount of sodium in the foods he eats but still get him something that he  will eat.  2.  Hypertension: -His blood pressure is elevated a little above goal, but he is on chronic steroid therapy and nebulizers.  His respiratory status is tenuous, so I will not be aggressive with blood pressure control medications.   Current medicines are reviewed at length with the patient today.  The patient does not have concerns regarding  medicines.  The following changes have been made:  no change  Labs/ tests ordered today include:   Orders Placed This Encounter  Procedures  . Basic metabolic panel  . EKG 12-Lead     Disposition:   FU with Carlyle Dolly, MD  Signed, Dominic Ferries, PA-C  11/27/2018 9:52 PM    [facility] Phone: [phone]; Fax: [phone]

## 2018-11-27 ENCOUNTER — Ambulatory Visit (INDEPENDENT_AMBULATORY_CARE_PROVIDER_SITE_OTHER): Payer: Medicare Other | Admitting: Physician Assistant

## 2018-11-27 ENCOUNTER — Encounter: Payer: Self-pay | Admitting: Physician Assistant

## 2018-11-27 ENCOUNTER — Other Ambulatory Visit: Payer: Self-pay

## 2018-11-27 VITALS — BP 146/81 | HR 99 | Ht 68.0 in | Wt 191.4 lb

## 2018-11-27 DIAGNOSIS — Z9981 Dependence on supplemental oxygen: Secondary | ICD-10-CM | POA: Diagnosis not present

## 2018-11-27 DIAGNOSIS — I10 Essential (primary) hypertension: Secondary | ICD-10-CM

## 2018-11-27 DIAGNOSIS — Z720 Tobacco use: Secondary | ICD-10-CM | POA: Diagnosis not present

## 2018-11-27 DIAGNOSIS — J441 Chronic obstructive pulmonary disease with (acute) exacerbation: Secondary | ICD-10-CM | POA: Diagnosis not present

## 2018-11-27 DIAGNOSIS — I5032 Chronic diastolic (congestive) heart failure: Secondary | ICD-10-CM | POA: Diagnosis not present

## 2018-11-27 DIAGNOSIS — R609 Edema, unspecified: Secondary | ICD-10-CM | POA: Diagnosis not present

## 2018-11-27 DIAGNOSIS — L570 Actinic keratosis: Secondary | ICD-10-CM | POA: Diagnosis not present

## 2018-11-27 MED ORDER — CHLORTHALIDONE 25 MG PO TABS: 25.0000 mg | ORAL_TABLET | Freq: Every day | ORAL | 6 refills | Status: AC

## 2018-11-27 NOTE — Patient Instructions (Addendum)
Medication Instructions:   Your physician recommends that you continue on your current medications as directed. Please refer to the Current Medication list given to you today.  Labwork:  Your physician recommends that you return for lab work in: TODAY to check your BMET. You may have this done at [facility].  Testing/Procedures:  NONE  Follow-Up:  Your physician recommends that you schedule a follow-up appointment in: 6 weeks with Dr. Harl Bowie.  Any Other Special Instructions Will Be Listed Below (If Applicable).  Please take frequent breaks when getting ready to leave home to prevent over exertion and decrease shortness of breath.  If you need a refill on your cardiac medications before your next appointment, please call your pharmacy.  WEIGH DAILY, every am, wearing the same amount of clothing Record weights, contact Carlyle Dolly, MD for weight gain of 3 lbs in a day or 5 lbs in a week Limit sodium to 500 mg per meal, total 2000 mg per day Limit all liquids to 1.5-2 liters/quarts per day

## 2018-11-28 DIAGNOSIS — J441 Chronic obstructive pulmonary disease with (acute) exacerbation: Secondary | ICD-10-CM | POA: Diagnosis not present

## 2018-11-28 DIAGNOSIS — R609 Edema, unspecified: Secondary | ICD-10-CM | POA: Diagnosis not present

## 2018-11-28 DIAGNOSIS — Z9981 Dependence on supplemental oxygen: Secondary | ICD-10-CM | POA: Diagnosis not present

## 2018-11-28 DIAGNOSIS — I10 Essential (primary) hypertension: Secondary | ICD-10-CM | POA: Diagnosis not present

## 2018-11-28 DIAGNOSIS — Z720 Tobacco use: Secondary | ICD-10-CM | POA: Diagnosis not present

## 2018-11-28 DIAGNOSIS — L570 Actinic keratosis: Secondary | ICD-10-CM | POA: Diagnosis not present

## 2018-11-28 NOTE — Telephone Encounter (Signed)
Called [employer] to follow up on patient's application. Rep advised that they are still waiting on patient's prescription record documents. They were faxed on 11/26/18. Will refax and follow up.  Phone# [phone]  11:07 AM Beatriz Chancellor, CPhT

## 2018-12-01 DIAGNOSIS — R609 Edema, unspecified: Secondary | ICD-10-CM | POA: Diagnosis not present

## 2018-12-01 DIAGNOSIS — J441 Chronic obstructive pulmonary disease with (acute) exacerbation: Secondary | ICD-10-CM | POA: Diagnosis not present

## 2018-12-01 DIAGNOSIS — Z9981 Dependence on supplemental oxygen: Secondary | ICD-10-CM | POA: Diagnosis not present

## 2018-12-01 DIAGNOSIS — I10 Essential (primary) hypertension: Secondary | ICD-10-CM | POA: Diagnosis not present

## 2018-12-01 DIAGNOSIS — L570 Actinic keratosis: Secondary | ICD-10-CM | POA: Diagnosis not present

## 2018-12-01 DIAGNOSIS — Z720 Tobacco use: Secondary | ICD-10-CM | POA: Diagnosis not present

## 2018-12-01 NOTE — Telephone Encounter (Signed)
Called [employer] to follow up on patient's application. Per rep Amy, the faxes are still coming through really blurry on their end, requested a refax of Prescription Out of Pocket and Patient's Rx cards. Rep said they do not have an alternate fax number for [place] to use. Please refax documents on Wednesday.  10:39 AM Beatriz Chancellor, CPhT

## 2018-12-03 ENCOUNTER — Encounter: Payer: Self-pay | Admitting: *Deleted

## 2018-12-03 DIAGNOSIS — Z720 Tobacco use: Secondary | ICD-10-CM | POA: Diagnosis not present

## 2018-12-03 DIAGNOSIS — I10 Essential (primary) hypertension: Secondary | ICD-10-CM | POA: Diagnosis not present

## 2018-12-03 DIAGNOSIS — J441 Chronic obstructive pulmonary disease with (acute) exacerbation: Secondary | ICD-10-CM | POA: Diagnosis not present

## 2018-12-03 DIAGNOSIS — L570 Actinic keratosis: Secondary | ICD-10-CM | POA: Diagnosis not present

## 2018-12-03 DIAGNOSIS — Z9981 Dependence on supplemental oxygen: Secondary | ICD-10-CM | POA: Diagnosis not present

## 2018-12-03 DIAGNOSIS — R609 Edema, unspecified: Secondary | ICD-10-CM | POA: Diagnosis not present

## 2018-12-04 NOTE — Telephone Encounter (Signed)
Re-faxed documents 8/19

## 2018-12-05 NOTE — Telephone Encounter (Signed)
Thank you, Rachael!

## 2018-12-05 NOTE — Telephone Encounter (Signed)
Called [employer], to follow up they did received the additional documents. Rep, Lurline Idol processed the patient's application while I was on the phone. Patient has been APPROVED for coverage 12/05/2018- 04/16/2019. Prescription will be sent to their distribution pharmacy within 24-48 business hours. Once the pharmacy processes prescription with in 7 business days and will then mail medication will automatically to the patient's home.  Called patient and advised him and his wife. They had no questions or concerns at this time.  12:05 PM Beatriz Chancellor, CPhT

## 2018-12-05 NOTE — Telephone Encounter (Signed)
Received confirmation of Approval for Patient Assistance from [place] for Trelegy. Patient has been approved for assistance through 04/16/19.  Phone# [phone]  Fax# [phone]

## 2018-12-06 DIAGNOSIS — I10 Essential (primary) hypertension: Secondary | ICD-10-CM | POA: Diagnosis not present

## 2018-12-06 DIAGNOSIS — L570 Actinic keratosis: Secondary | ICD-10-CM | POA: Diagnosis not present

## 2018-12-06 DIAGNOSIS — Z9981 Dependence on supplemental oxygen: Secondary | ICD-10-CM | POA: Diagnosis not present

## 2018-12-06 DIAGNOSIS — J441 Chronic obstructive pulmonary disease with (acute) exacerbation: Secondary | ICD-10-CM | POA: Diagnosis not present

## 2018-12-06 DIAGNOSIS — R609 Edema, unspecified: Secondary | ICD-10-CM | POA: Diagnosis not present

## 2018-12-06 DIAGNOSIS — Z720 Tobacco use: Secondary | ICD-10-CM | POA: Diagnosis not present

## 2018-12-08 DIAGNOSIS — Z9981 Dependence on supplemental oxygen: Secondary | ICD-10-CM | POA: Diagnosis not present

## 2018-12-08 DIAGNOSIS — I10 Essential (primary) hypertension: Secondary | ICD-10-CM | POA: Diagnosis not present

## 2018-12-08 DIAGNOSIS — J441 Chronic obstructive pulmonary disease with (acute) exacerbation: Secondary | ICD-10-CM | POA: Diagnosis not present

## 2018-12-08 DIAGNOSIS — L570 Actinic keratosis: Secondary | ICD-10-CM | POA: Diagnosis not present

## 2018-12-08 DIAGNOSIS — Z720 Tobacco use: Secondary | ICD-10-CM | POA: Diagnosis not present

## 2018-12-08 DIAGNOSIS — R609 Edema, unspecified: Secondary | ICD-10-CM | POA: Diagnosis not present

## 2018-12-09 DIAGNOSIS — J441 Chronic obstructive pulmonary disease with (acute) exacerbation: Secondary | ICD-10-CM | POA: Diagnosis not present

## 2018-12-09 DIAGNOSIS — L570 Actinic keratosis: Secondary | ICD-10-CM | POA: Diagnosis not present

## 2018-12-09 DIAGNOSIS — Z9981 Dependence on supplemental oxygen: Secondary | ICD-10-CM | POA: Diagnosis not present

## 2018-12-09 DIAGNOSIS — Z720 Tobacco use: Secondary | ICD-10-CM | POA: Diagnosis not present

## 2018-12-09 DIAGNOSIS — I10 Essential (primary) hypertension: Secondary | ICD-10-CM | POA: Diagnosis not present

## 2018-12-09 DIAGNOSIS — R609 Edema, unspecified: Secondary | ICD-10-CM | POA: Diagnosis not present

## 2018-12-10 DIAGNOSIS — L570 Actinic keratosis: Secondary | ICD-10-CM | POA: Diagnosis not present

## 2018-12-10 DIAGNOSIS — Z720 Tobacco use: Secondary | ICD-10-CM | POA: Diagnosis not present

## 2018-12-10 DIAGNOSIS — J441 Chronic obstructive pulmonary disease with (acute) exacerbation: Secondary | ICD-10-CM | POA: Diagnosis not present

## 2018-12-10 DIAGNOSIS — I10 Essential (primary) hypertension: Secondary | ICD-10-CM | POA: Diagnosis not present

## 2018-12-10 DIAGNOSIS — R609 Edema, unspecified: Secondary | ICD-10-CM | POA: Diagnosis not present

## 2018-12-10 DIAGNOSIS — Z9981 Dependence on supplemental oxygen: Secondary | ICD-10-CM | POA: Diagnosis not present

## 2018-12-11 DIAGNOSIS — I10 Essential (primary) hypertension: Secondary | ICD-10-CM | POA: Diagnosis not present

## 2018-12-11 DIAGNOSIS — Z9981 Dependence on supplemental oxygen: Secondary | ICD-10-CM | POA: Diagnosis not present

## 2018-12-11 DIAGNOSIS — Z720 Tobacco use: Secondary | ICD-10-CM | POA: Diagnosis not present

## 2018-12-11 DIAGNOSIS — L570 Actinic keratosis: Secondary | ICD-10-CM | POA: Diagnosis not present

## 2018-12-11 DIAGNOSIS — J441 Chronic obstructive pulmonary disease with (acute) exacerbation: Secondary | ICD-10-CM | POA: Diagnosis not present

## 2018-12-11 DIAGNOSIS — R609 Edema, unspecified: Secondary | ICD-10-CM | POA: Diagnosis not present

## 2018-12-12 DIAGNOSIS — Z720 Tobacco use: Secondary | ICD-10-CM | POA: Diagnosis not present

## 2018-12-12 DIAGNOSIS — J441 Chronic obstructive pulmonary disease with (acute) exacerbation: Secondary | ICD-10-CM | POA: Diagnosis not present

## 2018-12-12 DIAGNOSIS — L570 Actinic keratosis: Secondary | ICD-10-CM | POA: Diagnosis not present

## 2018-12-12 DIAGNOSIS — I10 Essential (primary) hypertension: Secondary | ICD-10-CM | POA: Diagnosis not present

## 2018-12-12 DIAGNOSIS — Z9981 Dependence on supplemental oxygen: Secondary | ICD-10-CM | POA: Diagnosis not present

## 2018-12-12 DIAGNOSIS — R609 Edema, unspecified: Secondary | ICD-10-CM | POA: Diagnosis not present

## 2018-12-15 DIAGNOSIS — L570 Actinic keratosis: Secondary | ICD-10-CM | POA: Diagnosis not present

## 2018-12-15 DIAGNOSIS — I10 Essential (primary) hypertension: Secondary | ICD-10-CM | POA: Diagnosis not present

## 2018-12-15 DIAGNOSIS — Z720 Tobacco use: Secondary | ICD-10-CM | POA: Diagnosis not present

## 2018-12-15 DIAGNOSIS — J441 Chronic obstructive pulmonary disease with (acute) exacerbation: Secondary | ICD-10-CM | POA: Diagnosis not present

## 2018-12-15 DIAGNOSIS — Z9981 Dependence on supplemental oxygen: Secondary | ICD-10-CM | POA: Diagnosis not present

## 2018-12-15 DIAGNOSIS — R609 Edema, unspecified: Secondary | ICD-10-CM | POA: Diagnosis not present

## 2018-12-16 DIAGNOSIS — Z720 Tobacco use: Secondary | ICD-10-CM | POA: Diagnosis not present

## 2018-12-16 DIAGNOSIS — L570 Actinic keratosis: Secondary | ICD-10-CM | POA: Diagnosis not present

## 2018-12-16 DIAGNOSIS — Z66 Do not resuscitate: Secondary | ICD-10-CM | POA: Diagnosis not present

## 2018-12-16 DIAGNOSIS — Z9981 Dependence on supplemental oxygen: Secondary | ICD-10-CM | POA: Diagnosis not present

## 2018-12-16 DIAGNOSIS — I10 Essential (primary) hypertension: Secondary | ICD-10-CM | POA: Diagnosis not present

## 2018-12-16 DIAGNOSIS — R609 Edema, unspecified: Secondary | ICD-10-CM | POA: Diagnosis not present

## 2018-12-16 DIAGNOSIS — Z515 Encounter for palliative care: Secondary | ICD-10-CM | POA: Diagnosis not present

## 2018-12-16 DIAGNOSIS — J441 Chronic obstructive pulmonary disease with (acute) exacerbation: Secondary | ICD-10-CM | POA: Diagnosis not present

## 2018-12-17 DIAGNOSIS — Z9981 Dependence on supplemental oxygen: Secondary | ICD-10-CM | POA: Diagnosis not present

## 2018-12-17 DIAGNOSIS — L570 Actinic keratosis: Secondary | ICD-10-CM | POA: Diagnosis not present

## 2018-12-17 DIAGNOSIS — R609 Edema, unspecified: Secondary | ICD-10-CM | POA: Diagnosis not present

## 2018-12-17 DIAGNOSIS — I10 Essential (primary) hypertension: Secondary | ICD-10-CM | POA: Diagnosis not present

## 2018-12-17 DIAGNOSIS — Z720 Tobacco use: Secondary | ICD-10-CM | POA: Diagnosis not present

## 2018-12-17 DIAGNOSIS — J441 Chronic obstructive pulmonary disease with (acute) exacerbation: Secondary | ICD-10-CM | POA: Diagnosis not present

## 2018-12-18 DIAGNOSIS — I10 Essential (primary) hypertension: Secondary | ICD-10-CM | POA: Diagnosis not present

## 2018-12-18 DIAGNOSIS — L570 Actinic keratosis: Secondary | ICD-10-CM | POA: Diagnosis not present

## 2018-12-18 DIAGNOSIS — J441 Chronic obstructive pulmonary disease with (acute) exacerbation: Secondary | ICD-10-CM | POA: Diagnosis not present

## 2018-12-18 DIAGNOSIS — Z720 Tobacco use: Secondary | ICD-10-CM | POA: Diagnosis not present

## 2018-12-18 DIAGNOSIS — Z9981 Dependence on supplemental oxygen: Secondary | ICD-10-CM | POA: Diagnosis not present

## 2018-12-18 DIAGNOSIS — R609 Edema, unspecified: Secondary | ICD-10-CM | POA: Diagnosis not present

## 2018-12-19 DIAGNOSIS — Z9981 Dependence on supplemental oxygen: Secondary | ICD-10-CM | POA: Diagnosis not present

## 2018-12-19 DIAGNOSIS — J441 Chronic obstructive pulmonary disease with (acute) exacerbation: Secondary | ICD-10-CM | POA: Diagnosis not present

## 2018-12-19 DIAGNOSIS — Z720 Tobacco use: Secondary | ICD-10-CM | POA: Diagnosis not present

## 2018-12-19 DIAGNOSIS — R609 Edema, unspecified: Secondary | ICD-10-CM | POA: Diagnosis not present

## 2018-12-19 DIAGNOSIS — I10 Essential (primary) hypertension: Secondary | ICD-10-CM | POA: Diagnosis not present

## 2018-12-19 DIAGNOSIS — L570 Actinic keratosis: Secondary | ICD-10-CM | POA: Diagnosis not present

## 2018-12-19 NOTE — Telephone Encounter (Signed)
Spoke to wife and confirmed that patient has received his 1st shipment of Trelegy from [place] earlier this week. Advised for them to reach out to office if they have any issues or concerns.  Closing encounter.  10:46 AM Beatriz Chancellor, CPhT

## 2018-12-23 DIAGNOSIS — I10 Essential (primary) hypertension: Secondary | ICD-10-CM | POA: Diagnosis not present

## 2018-12-23 DIAGNOSIS — R609 Edema, unspecified: Secondary | ICD-10-CM | POA: Diagnosis not present

## 2018-12-23 DIAGNOSIS — J441 Chronic obstructive pulmonary disease with (acute) exacerbation: Secondary | ICD-10-CM | POA: Diagnosis not present

## 2018-12-23 DIAGNOSIS — Z720 Tobacco use: Secondary | ICD-10-CM | POA: Diagnosis not present

## 2018-12-23 DIAGNOSIS — L570 Actinic keratosis: Secondary | ICD-10-CM | POA: Diagnosis not present

## 2018-12-23 DIAGNOSIS — Z9981 Dependence on supplemental oxygen: Secondary | ICD-10-CM | POA: Diagnosis not present

## 2018-12-24 DIAGNOSIS — R609 Edema, unspecified: Secondary | ICD-10-CM | POA: Diagnosis not present

## 2018-12-24 DIAGNOSIS — L570 Actinic keratosis: Secondary | ICD-10-CM | POA: Diagnosis not present

## 2018-12-24 DIAGNOSIS — J441 Chronic obstructive pulmonary disease with (acute) exacerbation: Secondary | ICD-10-CM | POA: Diagnosis not present

## 2018-12-24 DIAGNOSIS — Z9981 Dependence on supplemental oxygen: Secondary | ICD-10-CM | POA: Diagnosis not present

## 2018-12-24 DIAGNOSIS — Z720 Tobacco use: Secondary | ICD-10-CM | POA: Diagnosis not present

## 2018-12-24 DIAGNOSIS — I10 Essential (primary) hypertension: Secondary | ICD-10-CM | POA: Diagnosis not present

## 2018-12-25 DIAGNOSIS — I10 Essential (primary) hypertension: Secondary | ICD-10-CM | POA: Diagnosis not present

## 2018-12-25 DIAGNOSIS — J441 Chronic obstructive pulmonary disease with (acute) exacerbation: Secondary | ICD-10-CM | POA: Diagnosis not present

## 2018-12-25 DIAGNOSIS — R609 Edema, unspecified: Secondary | ICD-10-CM | POA: Diagnosis not present

## 2018-12-25 DIAGNOSIS — Z720 Tobacco use: Secondary | ICD-10-CM | POA: Diagnosis not present

## 2018-12-25 DIAGNOSIS — Z9981 Dependence on supplemental oxygen: Secondary | ICD-10-CM | POA: Diagnosis not present

## 2018-12-25 DIAGNOSIS — L570 Actinic keratosis: Secondary | ICD-10-CM | POA: Diagnosis not present

## 2018-12-26 DIAGNOSIS — Z9981 Dependence on supplemental oxygen: Secondary | ICD-10-CM | POA: Diagnosis not present

## 2018-12-26 DIAGNOSIS — J441 Chronic obstructive pulmonary disease with (acute) exacerbation: Secondary | ICD-10-CM | POA: Diagnosis not present

## 2018-12-26 DIAGNOSIS — R609 Edema, unspecified: Secondary | ICD-10-CM | POA: Diagnosis not present

## 2018-12-26 DIAGNOSIS — I10 Essential (primary) hypertension: Secondary | ICD-10-CM | POA: Diagnosis not present

## 2018-12-26 DIAGNOSIS — L570 Actinic keratosis: Secondary | ICD-10-CM | POA: Diagnosis not present

## 2018-12-26 DIAGNOSIS — Z720 Tobacco use: Secondary | ICD-10-CM | POA: Diagnosis not present

## 2018-12-29 DIAGNOSIS — Z720 Tobacco use: Secondary | ICD-10-CM | POA: Diagnosis not present

## 2018-12-29 DIAGNOSIS — L570 Actinic keratosis: Secondary | ICD-10-CM | POA: Diagnosis not present

## 2018-12-29 DIAGNOSIS — R609 Edema, unspecified: Secondary | ICD-10-CM | POA: Diagnosis not present

## 2018-12-29 DIAGNOSIS — Z9981 Dependence on supplemental oxygen: Secondary | ICD-10-CM | POA: Diagnosis not present

## 2018-12-29 DIAGNOSIS — I10 Essential (primary) hypertension: Secondary | ICD-10-CM | POA: Diagnosis not present

## 2018-12-29 DIAGNOSIS — J441 Chronic obstructive pulmonary disease with (acute) exacerbation: Secondary | ICD-10-CM | POA: Diagnosis not present

## 2018-12-30 DIAGNOSIS — L570 Actinic keratosis: Secondary | ICD-10-CM | POA: Diagnosis not present

## 2018-12-30 DIAGNOSIS — I10 Essential (primary) hypertension: Secondary | ICD-10-CM | POA: Diagnosis not present

## 2018-12-30 DIAGNOSIS — Z720 Tobacco use: Secondary | ICD-10-CM | POA: Diagnosis not present

## 2018-12-30 DIAGNOSIS — R609 Edema, unspecified: Secondary | ICD-10-CM | POA: Diagnosis not present

## 2018-12-30 DIAGNOSIS — Z9981 Dependence on supplemental oxygen: Secondary | ICD-10-CM | POA: Diagnosis not present

## 2018-12-30 DIAGNOSIS — J441 Chronic obstructive pulmonary disease with (acute) exacerbation: Secondary | ICD-10-CM | POA: Diagnosis not present

## 2018-12-31 DIAGNOSIS — Z720 Tobacco use: Secondary | ICD-10-CM | POA: Diagnosis not present

## 2018-12-31 DIAGNOSIS — J441 Chronic obstructive pulmonary disease with (acute) exacerbation: Secondary | ICD-10-CM | POA: Diagnosis not present

## 2018-12-31 DIAGNOSIS — I10 Essential (primary) hypertension: Secondary | ICD-10-CM | POA: Diagnosis not present

## 2018-12-31 DIAGNOSIS — L570 Actinic keratosis: Secondary | ICD-10-CM | POA: Diagnosis not present

## 2018-12-31 DIAGNOSIS — R609 Edema, unspecified: Secondary | ICD-10-CM | POA: Diagnosis not present

## 2018-12-31 DIAGNOSIS — Z9981 Dependence on supplemental oxygen: Secondary | ICD-10-CM | POA: Diagnosis not present

## 2019-01-01 ENCOUNTER — Telehealth: Payer: Self-pay | Admitting: Pulmonary Disease

## 2019-01-01 DIAGNOSIS — I10 Essential (primary) hypertension: Secondary | ICD-10-CM | POA: Diagnosis not present

## 2019-01-01 DIAGNOSIS — L570 Actinic keratosis: Secondary | ICD-10-CM | POA: Diagnosis not present

## 2019-01-01 DIAGNOSIS — Z720 Tobacco use: Secondary | ICD-10-CM | POA: Diagnosis not present

## 2019-01-01 DIAGNOSIS — Z9981 Dependence on supplemental oxygen: Secondary | ICD-10-CM | POA: Diagnosis not present

## 2019-01-01 DIAGNOSIS — R609 Edema, unspecified: Secondary | ICD-10-CM | POA: Diagnosis not present

## 2019-01-01 DIAGNOSIS — J441 Chronic obstructive pulmonary disease with (acute) exacerbation: Secondary | ICD-10-CM | POA: Diagnosis not present

## 2019-01-01 NOTE — Telephone Encounter (Signed)
ATC Gina , voicemail box full. Unable to leave message. [name] not back in office until 01/19/19. [place] has forms in hand for him to sign when she returns.

## 2019-01-02 DIAGNOSIS — Z720 Tobacco use: Secondary | ICD-10-CM | POA: Diagnosis not present

## 2019-01-02 DIAGNOSIS — Z9981 Dependence on supplemental oxygen: Secondary | ICD-10-CM | POA: Diagnosis not present

## 2019-01-02 DIAGNOSIS — I10 Essential (primary) hypertension: Secondary | ICD-10-CM | POA: Diagnosis not present

## 2019-01-02 DIAGNOSIS — J441 Chronic obstructive pulmonary disease with (acute) exacerbation: Secondary | ICD-10-CM | POA: Diagnosis not present

## 2019-01-02 DIAGNOSIS — L570 Actinic keratosis: Secondary | ICD-10-CM | POA: Diagnosis not present

## 2019-01-02 DIAGNOSIS — R609 Edema, unspecified: Secondary | ICD-10-CM | POA: Diagnosis not present

## 2019-01-05 ENCOUNTER — Telehealth: Payer: Self-pay | Admitting: Cardiology

## 2019-01-05 ENCOUNTER — Encounter: Payer: Self-pay | Admitting: *Deleted

## 2019-01-05 NOTE — Telephone Encounter (Signed)
Gena w/ [facility], can be reached at [phone].  Would like to know if anyone can sign the order while awaiting for Dr. Juline Patch return.

## 2019-01-05 NOTE — Telephone Encounter (Signed)
Notes recorded by Lonn Georgia, PA-C on 12/24/2018 at 11:12 AM EDT  Please let him know his BMET is ok.  Follow up as scheduled  Thanks

## 2019-01-05 NOTE — Telephone Encounter (Signed)
Dominic Willis called in regards to getting results of recent blood work.

## 2019-01-05 NOTE — Telephone Encounter (Signed)
Called Gena and made her aware of the response below. She voiced understanding. Nothing further needed at this time.

## 2019-01-05 NOTE — Telephone Encounter (Signed)
Wife informed and requested copy of lab. Copy of lab mailed to patient and sent to PCP

## 2019-01-06 DIAGNOSIS — Z720 Tobacco use: Secondary | ICD-10-CM | POA: Diagnosis not present

## 2019-01-06 DIAGNOSIS — L570 Actinic keratosis: Secondary | ICD-10-CM | POA: Diagnosis not present

## 2019-01-06 DIAGNOSIS — R609 Edema, unspecified: Secondary | ICD-10-CM | POA: Diagnosis not present

## 2019-01-06 DIAGNOSIS — Z9981 Dependence on supplemental oxygen: Secondary | ICD-10-CM | POA: Diagnosis not present

## 2019-01-06 DIAGNOSIS — I10 Essential (primary) hypertension: Secondary | ICD-10-CM | POA: Diagnosis not present

## 2019-01-06 DIAGNOSIS — J441 Chronic obstructive pulmonary disease with (acute) exacerbation: Secondary | ICD-10-CM | POA: Diagnosis not present

## 2019-01-07 ENCOUNTER — Encounter: Payer: Self-pay | Admitting: Cardiology

## 2019-01-07 ENCOUNTER — Telehealth (INDEPENDENT_AMBULATORY_CARE_PROVIDER_SITE_OTHER): Payer: Medicare Other | Admitting: Cardiology

## 2019-01-07 VITALS — BP 170/100 | HR 96 | Ht 68.0 in | Wt 187.0 lb

## 2019-01-07 DIAGNOSIS — I10 Essential (primary) hypertension: Secondary | ICD-10-CM

## 2019-01-07 DIAGNOSIS — I5032 Chronic diastolic (congestive) heart failure: Secondary | ICD-10-CM | POA: Diagnosis not present

## 2019-01-07 NOTE — Patient Instructions (Signed)
Your physician recommends that you schedule a follow-up appointment in: AS NEEDED WITH DR BRANCH  Your physician recommends that you continue on your current medications as directed. Please refer to the Current Medication list given to you today.  Thank you for choosing [facility]!!    

## 2019-01-07 NOTE — Progress Notes (Signed)
Virtual Visit via Telephone Note   This visit type was conducted due to national recommendations for restrictions regarding the COVID-19 Pandemic (e.g. social distancing) in an effort to limit this patient's exposure and mitigate transmission in our community.  Due to his co-morbid illnesses, this patient is at least at moderate risk for complications without adequate follow up.  This format is felt to be most appropriate for this patient at this time.  The patient did not have access to video technology/had technical difficulties with video requiring transitioning to audio format only (telephone).  All issues noted in this document were discussed and addressed.  No physical exam could be performed with this format.  Please refer to the patient's chart for his  consent to telehealth for [facility].   Date:  01/07/2019   ID:  Dominic Willis, DOB 12/08/1940, MRN [id number]  Patient Location: Home Provider Location: Office  PCP:  Glenda Chroman, MD  Cardiologist:  Carlyle Dolly, MD  Electrophysiologist:  None   Evaluation Performed:  Follow-Up Visit  Chief Complaint:  Follow up visit  History of Present Illness:    Dominic Willis is a 78 y.o. male seen today for follow up of the following medical problems.   Patient recently under home hospice. Information primarily reported by his wife, patient feeling very weak today.   1. DOE/Chest pain - on inhalers, prednisone,and abx recently. Symptoms not much improved - DOE with walking up one flight of stairs. DOE with using his weedeater at 15 minutes. Limited walking at inclines as well, has to stop and rest.  - can get chest pain at times. Pressure midchest that is variable in severity, Resolves with rest. Symptoms ongoing for several years. Some increase in frequency.  - completed a nuclear stress test that showed no clear ischemia, echo with LVEF [id number], grade I diastolic dysfunction.  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  Mean PA 23, LVEDP 24, CI 2.3   - chronic symptoms that are severe but at there baseline, related to his advanced COPD    2. COPD - followed by Dr Woody Seller and Dr Lake Bells - PFTs 12/2015 very severe obstruction -onhome O2  - recently placed on home hospice - family working to help keep comfortable with supplemental O2, prn morphine.      The patient does not have symptoms concerning for COVID-19 infection (fever, chills, cough, or new shortness of breath).    Past Medical History:  Diagnosis Date  . Actinic keratosis   . Asthma   . COPD (chronic obstructive pulmonary disease) ([place])   . Hypertension   . Insomnia   . Seasonal allergies   . Tobacco abuse    Past Surgical History:  Procedure Laterality Date  . BREAST BIOPSY Left   . INGUINAL HERNIA REPAIR    . LUMBAR LAMINECTOMY     Dr. Joya Salm  . RIGHT/LEFT HEART CATH AND CORONARY ANGIOGRAPHY N/A 07/10/2016   Procedure: Right/Left Heart Cath and Coronary Angiography;  Surgeon: Jettie Booze, MD;  Location: [place] [facility];  Service: Cardiovascular;  Laterality: N/A;  . SHOULDER SURGERY Left      Current Meds  Medication Sig  . Acetaminophen (ACETAMIN PO) Take by mouth.  Marland Kitchen albuterol (PROVENTIL HFA;VENTOLIN HFA) 108 (90 Base) MCG/ACT inhaler Inhale 2-4 puffs into the lungs every 6 (six) hours as needed for wheezing or shortness of breath.  Marland Kitchen albuterol (PROVENTIL) (2.5 MG/3ML) 0.083% nebulizer solution Take 2.5 mg by nebulization every 4 (four) hours as  needed for wheezing or shortness of breath.   . ALPRAZolam (XANAX) 1 MG tablet PATIENT TAKES 1/2 TABLET IN THE MORNING, EVENING AND 1 TABLET AT BEDTIME  . chlorthalidone (HYGROTON) 25 MG tablet Take 1 tablet (25 mg total) by mouth daily. May take an extra tablet for weight gain of 3 lbs in 24 hours or 5 lbs in one week (Patient taking differently: Take 25 mg by mouth as needed. May take an extra tablet for weight gain of 3 lbs in 24 hours or 5 lbs in one week)  .  citalopram (CELEXA) 40 MG tablet Take 40 mg by mouth daily.  Marland Kitchen DALIRESP 500 MCG TABS tablet Take 1 tablet by mouth daily.  Marland Kitchen doxycycline (VIBRA-TABS) 100 MG tablet Take 100 mg by mouth daily.  . Fluticasone-Umeclidin-Vilant (TRELEGY ELLIPTA) 100-62.5-25 MCG/INH AEPB Inhale 1 puff into the lungs daily.  Marland Kitchen guaiFENesin (MUCINEX) 600 MG 12 hr tablet Take 600 mg by mouth 2 (two) times daily.  Marland Kitchen loratadine-pseudoephedrine (CLARITIN-D 12-HOUR) 5-120 MG tablet Take 1 tablet by mouth 2 (two) times daily as needed.   Marland Kitchen morphine 20 MG/5ML solution Take by mouth every 2 (two) hours as needed for pain.  . naproxen sodium (ANAPROX) 220 MG tablet Take 440 mg by mouth as needed (pain).  Marland Kitchen oxybutynin (DITROPAN-XL) 5 MG 24 hr tablet Take 5 mg by mouth at bedtime.  . pantoprazole (PROTONIX) 40 MG tablet Take 40 mg by mouth daily.  . predniSONE (DELTASONE) 10 MG tablet Take 4 tablets daily (40mg ) for 7 days, then 3 tablets daily (30mg ) for 7 days, then resume 2 tablets daily (Patient taking differently: 10 mg daily with breakfast. )  . senna (SENOKOT) 8.6 MG tablet Take 1 tablet by mouth daily. 2 IN THE MORNING AND 1 AT NIGHT  . tamsulosin (FLOMAX) 0.4 MG CAPS capsule Take 0.4 mg by mouth daily after supper.  . [DISCONTINUED] aspirin EC 81 MG tablet Take 81 mg by mouth at bedtime.   . [DISCONTINUED] b complex vitamins tablet Take 1 tablet by mouth daily.  . [DISCONTINUED] Biotin 1 MG CAPS Take 1 mg by mouth daily.  . [DISCONTINUED] losartan (COZAAR) 100 MG tablet Take 100 mg by mouth daily.     Allergies:   Brovana [arformoterol], Pulmicort [budesonide], and Symbicort [budesonide-formoterol fumarate]   Social History   Tobacco Use  . Smoking status: Former Smoker    Packs/day: 1.00    Years: 57.00    Pack years: 57.00    Types: Cigarettes    Start date: 03/27/1958    Quit date: 12/16/2015    Years since quitting: 3.0  . Smokeless tobacco: Never Used  Substance Use Topics  . Alcohol use: Not on file  .  Drug use: Not on file     Family Hx: The patient's family history includes Heart disease in his father and mother.  ROS:   Please see the history of present illness.     All other systems reviewed and are negative.   Prior CV studies:   The following studies were reviewed today:  03/2015 echo Study Conclusions  - Procedure narrative: Transthoracic echocardiography for left ventricular function evaluation, for right ventricular function evaluation, and for assessment of valvular function. Image quality was adequate. The study was technically difficult, as a result of poor sound wave transmission. - Left ventricle: The cavity size was normal. There was mild concentric hypertrophy. Systolic function was normal. The estimated ejection fraction was in the range of 60% to 65%. Images  were inadequate for LV wall motion assessment. However, no gross regional variation on available images including apical 4-chamber views. Doppler parameters are consistent with abnormal left ventricular relaxation (grade 1 diastolic dysfunction).   05/2015 MPI  Diffuse resting ST segment abnormalities.  Fixed inferior wall defect extending from apex to base with some inferoseptal involvement which is likely due to soft tissue attenuation artifact. No ischemic territories.  This is a low risk study.  Nuclear stress EF: 55%.  06/2016 cath  Mid RCA lesion, 10 %stenosed.  Mid LAD lesion, 10 %stenosed.  Ost 2nd Diag to 2nd Diag lesion, 50 %stenosed.  Mid Cx to Dist Cx lesion, 10 %stenosed.  The left ventricular systolic function is normal.  LV end diastolic pressure is moderately elevated.  The left ventricular ejection fraction is 55-65% by visual estimate.  There is no aortic valve stenosis.  No pulmonary artery hypertension. PA sat 64%. CO 4.6 L/min. CI 2.3.  Mild volume overload. Consider treatment with diuretic.   Smoking cessation recommended. Shortness  of breath may be related to lung issues as well. F/u with Dr. Harl Bowie.   10/2016 AAA [place] Screen No aneurysm  Labs/Other Tests and Data Reviewed:    EKG:  n/a  Recent Labs: 11/07/2018: ALT 20; BUN 32; Creatinine, Ser 1.27; Hemoglobin 13.6; NT-Pro BNP 573; Platelets 265.0; Potassium 4.4; Sodium 133   Recent Lipid Panel No results found for: CHOL, TRIG, HDL, CHOLHDL, LDLCALC, LDLDIRECT  Wt Readings from Last 3 Encounters:  01/07/19 187 lb (84.8 kg)  11/27/18 191 lb 6.4 oz (86.8 kg)  11/24/18 192 lb (87.1 kg)     Objective:    Vital Signs:  BP (!) 170/100   Pulse 96   Ht 5\' 8"  (1.727 m)   Wt 187 lb (84.8 kg)   SpO2 97%   BMI 28.43 kg/m    Unable to assess, phone visit is with his wife. Patient on home hospice to weak to come to phone.   ASSESSMENT & PLAN:    1.Chronic diastolic HF -primary breathing issue is related to his COPD. His wife is using prn chlorthaldone which seems to work, he has decreased oral intake so I agree daily dosing would be too much.   2. HTN - no real bp goals at this time given home hospice status, I would not make any changes   3. SOB/COPD - per pulmonary - on home hospice, working to keep him comfortable with suppppemental O2 and prn morphine    COVID-19 Education: The signs and symptoms of COVID-19 were discussed with the patient and how to seek care for testing (follow up with PCP or arrange E-visit).  The importance of social distancing was discussed today.  Time:   Today, I have spent 18 minutes with the patient with telehealth technology discussing the above problems.     Medication Adjustments/Labs and Tests Ordered: Current medicines are reviewed at length with the patient today.  Concerns regarding medicines are outlined above.   Tests Ordered: No orders of the defined types were placed in this encounter.   Medication Changes: No orders of the defined types were placed in this encounter.   Follow Up:  F/u as needed, I  let wife know we are available if needed but otherwise agree with goals and recommendations of home hospice team.   Signed, Carlyle Dolly, MD  01/07/2019 12:45 PM    [place]

## 2019-01-08 DIAGNOSIS — R609 Edema, unspecified: Secondary | ICD-10-CM | POA: Diagnosis not present

## 2019-01-08 DIAGNOSIS — J432 Centrilobular emphysema: Secondary | ICD-10-CM | POA: Diagnosis not present

## 2019-01-08 DIAGNOSIS — I10 Essential (primary) hypertension: Secondary | ICD-10-CM | POA: Diagnosis not present

## 2019-01-08 DIAGNOSIS — J441 Chronic obstructive pulmonary disease with (acute) exacerbation: Secondary | ICD-10-CM | POA: Diagnosis not present

## 2019-01-08 DIAGNOSIS — J9601 Acute respiratory failure with hypoxia: Secondary | ICD-10-CM | POA: Diagnosis not present

## 2019-01-08 DIAGNOSIS — J45909 Unspecified asthma, uncomplicated: Secondary | ICD-10-CM | POA: Diagnosis not present

## 2019-01-08 DIAGNOSIS — Z9981 Dependence on supplemental oxygen: Secondary | ICD-10-CM | POA: Diagnosis not present

## 2019-01-08 DIAGNOSIS — Z720 Tobacco use: Secondary | ICD-10-CM | POA: Diagnosis not present

## 2019-01-08 DIAGNOSIS — L570 Actinic keratosis: Secondary | ICD-10-CM | POA: Diagnosis not present

## 2019-01-09 DIAGNOSIS — Z9981 Dependence on supplemental oxygen: Secondary | ICD-10-CM | POA: Diagnosis not present

## 2019-01-09 DIAGNOSIS — Z720 Tobacco use: Secondary | ICD-10-CM | POA: Diagnosis not present

## 2019-01-09 DIAGNOSIS — J441 Chronic obstructive pulmonary disease with (acute) exacerbation: Secondary | ICD-10-CM | POA: Diagnosis not present

## 2019-01-09 DIAGNOSIS — L570 Actinic keratosis: Secondary | ICD-10-CM | POA: Diagnosis not present

## 2019-01-09 DIAGNOSIS — I10 Essential (primary) hypertension: Secondary | ICD-10-CM | POA: Diagnosis not present

## 2019-01-09 DIAGNOSIS — R609 Edema, unspecified: Secondary | ICD-10-CM | POA: Diagnosis not present

## 2019-01-13 DIAGNOSIS — Z9981 Dependence on supplemental oxygen: Secondary | ICD-10-CM | POA: Diagnosis not present

## 2019-01-13 DIAGNOSIS — L570 Actinic keratosis: Secondary | ICD-10-CM | POA: Diagnosis not present

## 2019-01-13 DIAGNOSIS — I10 Essential (primary) hypertension: Secondary | ICD-10-CM | POA: Diagnosis not present

## 2019-01-13 DIAGNOSIS — Z720 Tobacco use: Secondary | ICD-10-CM | POA: Diagnosis not present

## 2019-01-13 DIAGNOSIS — R609 Edema, unspecified: Secondary | ICD-10-CM | POA: Diagnosis not present

## 2019-01-13 DIAGNOSIS — J441 Chronic obstructive pulmonary disease with (acute) exacerbation: Secondary | ICD-10-CM | POA: Diagnosis not present

## 2019-01-14 DIAGNOSIS — R609 Edema, unspecified: Secondary | ICD-10-CM | POA: Diagnosis not present

## 2019-01-14 DIAGNOSIS — Z720 Tobacco use: Secondary | ICD-10-CM | POA: Diagnosis not present

## 2019-01-14 DIAGNOSIS — J441 Chronic obstructive pulmonary disease with (acute) exacerbation: Secondary | ICD-10-CM | POA: Diagnosis not present

## 2019-01-14 DIAGNOSIS — Z9981 Dependence on supplemental oxygen: Secondary | ICD-10-CM | POA: Diagnosis not present

## 2019-01-14 DIAGNOSIS — L570 Actinic keratosis: Secondary | ICD-10-CM | POA: Diagnosis not present

## 2019-01-14 DIAGNOSIS — I10 Essential (primary) hypertension: Secondary | ICD-10-CM | POA: Diagnosis not present

## 2019-01-15 DIAGNOSIS — Z720 Tobacco use: Secondary | ICD-10-CM | POA: Diagnosis not present

## 2019-01-15 DIAGNOSIS — Z9981 Dependence on supplemental oxygen: Secondary | ICD-10-CM | POA: Diagnosis not present

## 2019-01-15 DIAGNOSIS — Z66 Do not resuscitate: Secondary | ICD-10-CM | POA: Diagnosis not present

## 2019-01-15 DIAGNOSIS — I10 Essential (primary) hypertension: Secondary | ICD-10-CM | POA: Diagnosis not present

## 2019-01-15 DIAGNOSIS — L570 Actinic keratosis: Secondary | ICD-10-CM | POA: Diagnosis not present

## 2019-01-15 DIAGNOSIS — Z515 Encounter for palliative care: Secondary | ICD-10-CM | POA: Diagnosis not present

## 2019-01-15 DIAGNOSIS — J441 Chronic obstructive pulmonary disease with (acute) exacerbation: Secondary | ICD-10-CM | POA: Diagnosis not present

## 2019-01-15 DIAGNOSIS — R609 Edema, unspecified: Secondary | ICD-10-CM | POA: Diagnosis not present

## 2019-01-16 DIAGNOSIS — L570 Actinic keratosis: Secondary | ICD-10-CM | POA: Diagnosis not present

## 2019-01-16 DIAGNOSIS — I10 Essential (primary) hypertension: Secondary | ICD-10-CM | POA: Diagnosis not present

## 2019-01-16 DIAGNOSIS — Z720 Tobacco use: Secondary | ICD-10-CM | POA: Diagnosis not present

## 2019-01-16 DIAGNOSIS — R609 Edema, unspecified: Secondary | ICD-10-CM | POA: Diagnosis not present

## 2019-01-16 DIAGNOSIS — Z9981 Dependence on supplemental oxygen: Secondary | ICD-10-CM | POA: Diagnosis not present

## 2019-01-16 DIAGNOSIS — J441 Chronic obstructive pulmonary disease with (acute) exacerbation: Secondary | ICD-10-CM | POA: Diagnosis not present

## 2019-01-20 DIAGNOSIS — L570 Actinic keratosis: Secondary | ICD-10-CM | POA: Diagnosis not present

## 2019-01-20 DIAGNOSIS — Z9981 Dependence on supplemental oxygen: Secondary | ICD-10-CM | POA: Diagnosis not present

## 2019-01-20 DIAGNOSIS — R609 Edema, unspecified: Secondary | ICD-10-CM | POA: Diagnosis not present

## 2019-01-20 DIAGNOSIS — I10 Essential (primary) hypertension: Secondary | ICD-10-CM | POA: Diagnosis not present

## 2019-01-20 DIAGNOSIS — J441 Chronic obstructive pulmonary disease with (acute) exacerbation: Secondary | ICD-10-CM | POA: Diagnosis not present

## 2019-01-20 DIAGNOSIS — Z720 Tobacco use: Secondary | ICD-10-CM | POA: Diagnosis not present

## 2019-01-21 DIAGNOSIS — Z9981 Dependence on supplemental oxygen: Secondary | ICD-10-CM | POA: Diagnosis not present

## 2019-01-21 DIAGNOSIS — I10 Essential (primary) hypertension: Secondary | ICD-10-CM | POA: Diagnosis not present

## 2019-01-21 DIAGNOSIS — L570 Actinic keratosis: Secondary | ICD-10-CM | POA: Diagnosis not present

## 2019-01-21 DIAGNOSIS — J441 Chronic obstructive pulmonary disease with (acute) exacerbation: Secondary | ICD-10-CM | POA: Diagnosis not present

## 2019-01-21 DIAGNOSIS — Z720 Tobacco use: Secondary | ICD-10-CM | POA: Diagnosis not present

## 2019-01-21 DIAGNOSIS — R609 Edema, unspecified: Secondary | ICD-10-CM | POA: Diagnosis not present

## 2019-01-22 DIAGNOSIS — I10 Essential (primary) hypertension: Secondary | ICD-10-CM | POA: Diagnosis not present

## 2019-01-22 DIAGNOSIS — L570 Actinic keratosis: Secondary | ICD-10-CM | POA: Diagnosis not present

## 2019-01-22 DIAGNOSIS — R609 Edema, unspecified: Secondary | ICD-10-CM | POA: Diagnosis not present

## 2019-01-22 DIAGNOSIS — Z720 Tobacco use: Secondary | ICD-10-CM | POA: Diagnosis not present

## 2019-01-22 DIAGNOSIS — J441 Chronic obstructive pulmonary disease with (acute) exacerbation: Secondary | ICD-10-CM | POA: Diagnosis not present

## 2019-01-22 DIAGNOSIS — Z9981 Dependence on supplemental oxygen: Secondary | ICD-10-CM | POA: Diagnosis not present

## 2019-01-23 DIAGNOSIS — Z720 Tobacco use: Secondary | ICD-10-CM | POA: Diagnosis not present

## 2019-01-23 DIAGNOSIS — J441 Chronic obstructive pulmonary disease with (acute) exacerbation: Secondary | ICD-10-CM | POA: Diagnosis not present

## 2019-01-23 DIAGNOSIS — I10 Essential (primary) hypertension: Secondary | ICD-10-CM | POA: Diagnosis not present

## 2019-01-23 DIAGNOSIS — Z9981 Dependence on supplemental oxygen: Secondary | ICD-10-CM | POA: Diagnosis not present

## 2019-01-23 DIAGNOSIS — L570 Actinic keratosis: Secondary | ICD-10-CM | POA: Diagnosis not present

## 2019-01-23 DIAGNOSIS — R609 Edema, unspecified: Secondary | ICD-10-CM | POA: Diagnosis not present

## 2019-01-25 DIAGNOSIS — J441 Chronic obstructive pulmonary disease with (acute) exacerbation: Secondary | ICD-10-CM | POA: Diagnosis not present

## 2019-01-25 DIAGNOSIS — I10 Essential (primary) hypertension: Secondary | ICD-10-CM | POA: Diagnosis not present

## 2019-01-25 DIAGNOSIS — R609 Edema, unspecified: Secondary | ICD-10-CM | POA: Diagnosis not present

## 2019-01-25 DIAGNOSIS — Z720 Tobacco use: Secondary | ICD-10-CM | POA: Diagnosis not present

## 2019-01-25 DIAGNOSIS — Z9981 Dependence on supplemental oxygen: Secondary | ICD-10-CM | POA: Diagnosis not present

## 2019-01-25 DIAGNOSIS — L570 Actinic keratosis: Secondary | ICD-10-CM | POA: Diagnosis not present

## 2019-01-26 DIAGNOSIS — Z720 Tobacco use: Secondary | ICD-10-CM | POA: Diagnosis not present

## 2019-01-26 DIAGNOSIS — I10 Essential (primary) hypertension: Secondary | ICD-10-CM | POA: Diagnosis not present

## 2019-01-26 DIAGNOSIS — R609 Edema, unspecified: Secondary | ICD-10-CM | POA: Diagnosis not present

## 2019-01-26 DIAGNOSIS — Z9981 Dependence on supplemental oxygen: Secondary | ICD-10-CM | POA: Diagnosis not present

## 2019-01-26 DIAGNOSIS — J441 Chronic obstructive pulmonary disease with (acute) exacerbation: Secondary | ICD-10-CM | POA: Diagnosis not present

## 2019-01-26 DIAGNOSIS — L570 Actinic keratosis: Secondary | ICD-10-CM | POA: Diagnosis not present

## 2019-01-27 DIAGNOSIS — L570 Actinic keratosis: Secondary | ICD-10-CM | POA: Diagnosis not present

## 2019-01-27 DIAGNOSIS — R609 Edema, unspecified: Secondary | ICD-10-CM | POA: Diagnosis not present

## 2019-01-27 DIAGNOSIS — Z720 Tobacco use: Secondary | ICD-10-CM | POA: Diagnosis not present

## 2019-01-27 DIAGNOSIS — J441 Chronic obstructive pulmonary disease with (acute) exacerbation: Secondary | ICD-10-CM | POA: Diagnosis not present

## 2019-01-27 DIAGNOSIS — Z9981 Dependence on supplemental oxygen: Secondary | ICD-10-CM | POA: Diagnosis not present

## 2019-01-27 DIAGNOSIS — I10 Essential (primary) hypertension: Secondary | ICD-10-CM | POA: Diagnosis not present

## 2019-01-29 DIAGNOSIS — Z9981 Dependence on supplemental oxygen: Secondary | ICD-10-CM | POA: Diagnosis not present

## 2019-01-29 DIAGNOSIS — I10 Essential (primary) hypertension: Secondary | ICD-10-CM | POA: Diagnosis not present

## 2019-01-29 DIAGNOSIS — L570 Actinic keratosis: Secondary | ICD-10-CM | POA: Diagnosis not present

## 2019-01-29 DIAGNOSIS — R609 Edema, unspecified: Secondary | ICD-10-CM | POA: Diagnosis not present

## 2019-01-29 DIAGNOSIS — J441 Chronic obstructive pulmonary disease with (acute) exacerbation: Secondary | ICD-10-CM | POA: Diagnosis not present

## 2019-01-29 DIAGNOSIS — Z720 Tobacco use: Secondary | ICD-10-CM | POA: Diagnosis not present

## 2019-01-30 DIAGNOSIS — J441 Chronic obstructive pulmonary disease with (acute) exacerbation: Secondary | ICD-10-CM | POA: Diagnosis not present

## 2019-01-30 DIAGNOSIS — Z720 Tobacco use: Secondary | ICD-10-CM | POA: Diagnosis not present

## 2019-01-30 DIAGNOSIS — L570 Actinic keratosis: Secondary | ICD-10-CM | POA: Diagnosis not present

## 2019-01-30 DIAGNOSIS — Z9981 Dependence on supplemental oxygen: Secondary | ICD-10-CM | POA: Diagnosis not present

## 2019-01-30 DIAGNOSIS — R609 Edema, unspecified: Secondary | ICD-10-CM | POA: Diagnosis not present

## 2019-01-30 DIAGNOSIS — I10 Essential (primary) hypertension: Secondary | ICD-10-CM | POA: Diagnosis not present

## 2019-02-02 DIAGNOSIS — I10 Essential (primary) hypertension: Secondary | ICD-10-CM | POA: Diagnosis not present

## 2019-02-02 DIAGNOSIS — R609 Edema, unspecified: Secondary | ICD-10-CM | POA: Diagnosis not present

## 2019-02-02 DIAGNOSIS — Z9981 Dependence on supplemental oxygen: Secondary | ICD-10-CM | POA: Diagnosis not present

## 2019-02-02 DIAGNOSIS — L570 Actinic keratosis: Secondary | ICD-10-CM | POA: Diagnosis not present

## 2019-02-02 DIAGNOSIS — J441 Chronic obstructive pulmonary disease with (acute) exacerbation: Secondary | ICD-10-CM | POA: Diagnosis not present

## 2019-02-02 DIAGNOSIS — Z720 Tobacco use: Secondary | ICD-10-CM | POA: Diagnosis not present

## 2019-02-03 DIAGNOSIS — R609 Edema, unspecified: Secondary | ICD-10-CM | POA: Diagnosis not present

## 2019-02-03 DIAGNOSIS — Z9981 Dependence on supplemental oxygen: Secondary | ICD-10-CM | POA: Diagnosis not present

## 2019-02-03 DIAGNOSIS — L570 Actinic keratosis: Secondary | ICD-10-CM | POA: Diagnosis not present

## 2019-02-03 DIAGNOSIS — Z720 Tobacco use: Secondary | ICD-10-CM | POA: Diagnosis not present

## 2019-02-03 DIAGNOSIS — J441 Chronic obstructive pulmonary disease with (acute) exacerbation: Secondary | ICD-10-CM | POA: Diagnosis not present

## 2019-02-03 DIAGNOSIS — I10 Essential (primary) hypertension: Secondary | ICD-10-CM | POA: Diagnosis not present

## 2019-02-04 DIAGNOSIS — R609 Edema, unspecified: Secondary | ICD-10-CM | POA: Diagnosis not present

## 2019-02-04 DIAGNOSIS — Z9981 Dependence on supplemental oxygen: Secondary | ICD-10-CM | POA: Diagnosis not present

## 2019-02-04 DIAGNOSIS — Z720 Tobacco use: Secondary | ICD-10-CM | POA: Diagnosis not present

## 2019-02-04 DIAGNOSIS — L570 Actinic keratosis: Secondary | ICD-10-CM | POA: Diagnosis not present

## 2019-02-04 DIAGNOSIS — I10 Essential (primary) hypertension: Secondary | ICD-10-CM | POA: Diagnosis not present

## 2019-02-04 DIAGNOSIS — J441 Chronic obstructive pulmonary disease with (acute) exacerbation: Secondary | ICD-10-CM | POA: Diagnosis not present

## 2019-02-05 DIAGNOSIS — Z9981 Dependence on supplemental oxygen: Secondary | ICD-10-CM | POA: Diagnosis not present

## 2019-02-05 DIAGNOSIS — Z720 Tobacco use: Secondary | ICD-10-CM | POA: Diagnosis not present

## 2019-02-05 DIAGNOSIS — R609 Edema, unspecified: Secondary | ICD-10-CM | POA: Diagnosis not present

## 2019-02-05 DIAGNOSIS — I10 Essential (primary) hypertension: Secondary | ICD-10-CM | POA: Diagnosis not present

## 2019-02-05 DIAGNOSIS — J441 Chronic obstructive pulmonary disease with (acute) exacerbation: Secondary | ICD-10-CM | POA: Diagnosis not present

## 2019-02-05 DIAGNOSIS — L570 Actinic keratosis: Secondary | ICD-10-CM | POA: Diagnosis not present

## 2019-02-06 ENCOUNTER — Telehealth: Payer: Self-pay | Admitting: Pulmonary Disease

## 2019-02-06 DIAGNOSIS — Z720 Tobacco use: Secondary | ICD-10-CM | POA: Diagnosis not present

## 2019-02-06 DIAGNOSIS — I10 Essential (primary) hypertension: Secondary | ICD-10-CM | POA: Diagnosis not present

## 2019-02-06 DIAGNOSIS — J441 Chronic obstructive pulmonary disease with (acute) exacerbation: Secondary | ICD-10-CM | POA: Diagnosis not present

## 2019-02-06 DIAGNOSIS — L570 Actinic keratosis: Secondary | ICD-10-CM | POA: Diagnosis not present

## 2019-02-06 DIAGNOSIS — Z9981 Dependence on supplemental oxygen: Secondary | ICD-10-CM | POA: Diagnosis not present

## 2019-02-06 DIAGNOSIS — R609 Edema, unspecified: Secondary | ICD-10-CM | POA: Diagnosis not present

## 2019-02-06 NOTE — Telephone Encounter (Signed)
Called and spoke with patient's Wife, [name].  [name] requested a spacer for Patient.  Patient stated she called local pharmacies and no one had spacers. Offered to place spacer at front desk for patient pick up.  [name] stated she could have her Brother stop by and pick up. Spacer placed at front desk for pick up. Nothing further at this time.

## 2019-02-09 DIAGNOSIS — Z9981 Dependence on supplemental oxygen: Secondary | ICD-10-CM | POA: Diagnosis not present

## 2019-02-09 DIAGNOSIS — I10 Essential (primary) hypertension: Secondary | ICD-10-CM | POA: Diagnosis not present

## 2019-02-09 DIAGNOSIS — Z720 Tobacco use: Secondary | ICD-10-CM | POA: Diagnosis not present

## 2019-02-09 DIAGNOSIS — J441 Chronic obstructive pulmonary disease with (acute) exacerbation: Secondary | ICD-10-CM | POA: Diagnosis not present

## 2019-02-09 DIAGNOSIS — L570 Actinic keratosis: Secondary | ICD-10-CM | POA: Diagnosis not present

## 2019-02-09 DIAGNOSIS — R609 Edema, unspecified: Secondary | ICD-10-CM | POA: Diagnosis not present

## 2019-02-10 DIAGNOSIS — J441 Chronic obstructive pulmonary disease with (acute) exacerbation: Secondary | ICD-10-CM | POA: Diagnosis not present

## 2019-02-10 DIAGNOSIS — Z9981 Dependence on supplemental oxygen: Secondary | ICD-10-CM | POA: Diagnosis not present

## 2019-02-10 DIAGNOSIS — Z720 Tobacco use: Secondary | ICD-10-CM | POA: Diagnosis not present

## 2019-02-10 DIAGNOSIS — I10 Essential (primary) hypertension: Secondary | ICD-10-CM | POA: Diagnosis not present

## 2019-02-10 DIAGNOSIS — L570 Actinic keratosis: Secondary | ICD-10-CM | POA: Diagnosis not present

## 2019-02-10 DIAGNOSIS — R609 Edema, unspecified: Secondary | ICD-10-CM | POA: Diagnosis not present

## 2019-02-11 DIAGNOSIS — I10 Essential (primary) hypertension: Secondary | ICD-10-CM | POA: Diagnosis not present

## 2019-02-11 DIAGNOSIS — J441 Chronic obstructive pulmonary disease with (acute) exacerbation: Secondary | ICD-10-CM | POA: Diagnosis not present

## 2019-02-11 DIAGNOSIS — R609 Edema, unspecified: Secondary | ICD-10-CM | POA: Diagnosis not present

## 2019-02-11 DIAGNOSIS — Z9981 Dependence on supplemental oxygen: Secondary | ICD-10-CM | POA: Diagnosis not present

## 2019-02-11 DIAGNOSIS — L570 Actinic keratosis: Secondary | ICD-10-CM | POA: Diagnosis not present

## 2019-02-11 DIAGNOSIS — Z720 Tobacco use: Secondary | ICD-10-CM | POA: Diagnosis not present

## 2019-02-12 DIAGNOSIS — I10 Essential (primary) hypertension: Secondary | ICD-10-CM | POA: Diagnosis not present

## 2019-02-12 DIAGNOSIS — L570 Actinic keratosis: Secondary | ICD-10-CM | POA: Diagnosis not present

## 2019-02-12 DIAGNOSIS — R609 Edema, unspecified: Secondary | ICD-10-CM | POA: Diagnosis not present

## 2019-02-12 DIAGNOSIS — Z720 Tobacco use: Secondary | ICD-10-CM | POA: Diagnosis not present

## 2019-02-12 DIAGNOSIS — Z9981 Dependence on supplemental oxygen: Secondary | ICD-10-CM | POA: Diagnosis not present

## 2019-02-12 DIAGNOSIS — J441 Chronic obstructive pulmonary disease with (acute) exacerbation: Secondary | ICD-10-CM | POA: Diagnosis not present

## 2019-02-13 DIAGNOSIS — R609 Edema, unspecified: Secondary | ICD-10-CM | POA: Diagnosis not present

## 2019-02-13 DIAGNOSIS — Z720 Tobacco use: Secondary | ICD-10-CM | POA: Diagnosis not present

## 2019-02-13 DIAGNOSIS — I10 Essential (primary) hypertension: Secondary | ICD-10-CM | POA: Diagnosis not present

## 2019-02-13 DIAGNOSIS — Z9981 Dependence on supplemental oxygen: Secondary | ICD-10-CM | POA: Diagnosis not present

## 2019-02-13 DIAGNOSIS — J441 Chronic obstructive pulmonary disease with (acute) exacerbation: Secondary | ICD-10-CM | POA: Diagnosis not present

## 2019-02-13 DIAGNOSIS — L570 Actinic keratosis: Secondary | ICD-10-CM | POA: Diagnosis not present

## 2019-02-15 DIAGNOSIS — Z9981 Dependence on supplemental oxygen: Secondary | ICD-10-CM | POA: Diagnosis not present

## 2019-02-15 DIAGNOSIS — Z66 Do not resuscitate: Secondary | ICD-10-CM | POA: Diagnosis not present

## 2019-02-15 DIAGNOSIS — J441 Chronic obstructive pulmonary disease with (acute) exacerbation: Secondary | ICD-10-CM | POA: Diagnosis not present

## 2019-02-15 DIAGNOSIS — Z720 Tobacco use: Secondary | ICD-10-CM | POA: Diagnosis not present

## 2019-02-15 DIAGNOSIS — R609 Edema, unspecified: Secondary | ICD-10-CM | POA: Diagnosis not present

## 2019-02-15 DIAGNOSIS — Z515 Encounter for palliative care: Secondary | ICD-10-CM | POA: Diagnosis not present

## 2019-02-15 DIAGNOSIS — L570 Actinic keratosis: Secondary | ICD-10-CM | POA: Diagnosis not present

## 2019-02-15 DIAGNOSIS — I10 Essential (primary) hypertension: Secondary | ICD-10-CM | POA: Diagnosis not present

## 2019-02-17 DIAGNOSIS — L570 Actinic keratosis: Secondary | ICD-10-CM | POA: Diagnosis not present

## 2019-02-17 DIAGNOSIS — Z9981 Dependence on supplemental oxygen: Secondary | ICD-10-CM | POA: Diagnosis not present

## 2019-02-17 DIAGNOSIS — R609 Edema, unspecified: Secondary | ICD-10-CM | POA: Diagnosis not present

## 2019-02-17 DIAGNOSIS — I10 Essential (primary) hypertension: Secondary | ICD-10-CM | POA: Diagnosis not present

## 2019-02-17 DIAGNOSIS — J441 Chronic obstructive pulmonary disease with (acute) exacerbation: Secondary | ICD-10-CM | POA: Diagnosis not present

## 2019-02-17 DIAGNOSIS — Z720 Tobacco use: Secondary | ICD-10-CM | POA: Diagnosis not present

## 2019-02-18 DIAGNOSIS — I10 Essential (primary) hypertension: Secondary | ICD-10-CM | POA: Diagnosis not present

## 2019-02-18 DIAGNOSIS — R609 Edema, unspecified: Secondary | ICD-10-CM | POA: Diagnosis not present

## 2019-02-18 DIAGNOSIS — Z720 Tobacco use: Secondary | ICD-10-CM | POA: Diagnosis not present

## 2019-02-18 DIAGNOSIS — L570 Actinic keratosis: Secondary | ICD-10-CM | POA: Diagnosis not present

## 2019-02-18 DIAGNOSIS — Z9981 Dependence on supplemental oxygen: Secondary | ICD-10-CM | POA: Diagnosis not present

## 2019-02-18 DIAGNOSIS — J441 Chronic obstructive pulmonary disease with (acute) exacerbation: Secondary | ICD-10-CM | POA: Diagnosis not present

## 2019-02-19 DIAGNOSIS — Z720 Tobacco use: Secondary | ICD-10-CM | POA: Diagnosis not present

## 2019-02-19 DIAGNOSIS — J441 Chronic obstructive pulmonary disease with (acute) exacerbation: Secondary | ICD-10-CM | POA: Diagnosis not present

## 2019-02-19 DIAGNOSIS — Z9981 Dependence on supplemental oxygen: Secondary | ICD-10-CM | POA: Diagnosis not present

## 2019-02-19 DIAGNOSIS — L570 Actinic keratosis: Secondary | ICD-10-CM | POA: Diagnosis not present

## 2019-02-19 DIAGNOSIS — R609 Edema, unspecified: Secondary | ICD-10-CM | POA: Diagnosis not present

## 2019-02-19 DIAGNOSIS — I10 Essential (primary) hypertension: Secondary | ICD-10-CM | POA: Diagnosis not present

## 2019-02-20 DIAGNOSIS — R609 Edema, unspecified: Secondary | ICD-10-CM | POA: Diagnosis not present

## 2019-02-20 DIAGNOSIS — Z720 Tobacco use: Secondary | ICD-10-CM | POA: Diagnosis not present

## 2019-02-20 DIAGNOSIS — I10 Essential (primary) hypertension: Secondary | ICD-10-CM | POA: Diagnosis not present

## 2019-02-20 DIAGNOSIS — J441 Chronic obstructive pulmonary disease with (acute) exacerbation: Secondary | ICD-10-CM | POA: Diagnosis not present

## 2019-02-20 DIAGNOSIS — L570 Actinic keratosis: Secondary | ICD-10-CM | POA: Diagnosis not present

## 2019-02-20 DIAGNOSIS — Z9981 Dependence on supplemental oxygen: Secondary | ICD-10-CM | POA: Diagnosis not present

## 2019-02-23 DIAGNOSIS — Z9981 Dependence on supplemental oxygen: Secondary | ICD-10-CM | POA: Diagnosis not present

## 2019-02-23 DIAGNOSIS — L570 Actinic keratosis: Secondary | ICD-10-CM | POA: Diagnosis not present

## 2019-02-23 DIAGNOSIS — J441 Chronic obstructive pulmonary disease with (acute) exacerbation: Secondary | ICD-10-CM | POA: Diagnosis not present

## 2019-02-23 DIAGNOSIS — Z720 Tobacco use: Secondary | ICD-10-CM | POA: Diagnosis not present

## 2019-02-23 DIAGNOSIS — I10 Essential (primary) hypertension: Secondary | ICD-10-CM | POA: Diagnosis not present

## 2019-02-23 DIAGNOSIS — R609 Edema, unspecified: Secondary | ICD-10-CM | POA: Diagnosis not present

## 2019-02-24 DIAGNOSIS — Z9981 Dependence on supplemental oxygen: Secondary | ICD-10-CM | POA: Diagnosis not present

## 2019-02-24 DIAGNOSIS — I10 Essential (primary) hypertension: Secondary | ICD-10-CM | POA: Diagnosis not present

## 2019-02-24 DIAGNOSIS — R609 Edema, unspecified: Secondary | ICD-10-CM | POA: Diagnosis not present

## 2019-02-24 DIAGNOSIS — J441 Chronic obstructive pulmonary disease with (acute) exacerbation: Secondary | ICD-10-CM | POA: Diagnosis not present

## 2019-02-24 DIAGNOSIS — L570 Actinic keratosis: Secondary | ICD-10-CM | POA: Diagnosis not present

## 2019-02-24 DIAGNOSIS — Z720 Tobacco use: Secondary | ICD-10-CM | POA: Diagnosis not present

## 2019-02-25 DIAGNOSIS — R609 Edema, unspecified: Secondary | ICD-10-CM | POA: Diagnosis not present

## 2019-02-25 DIAGNOSIS — I10 Essential (primary) hypertension: Secondary | ICD-10-CM | POA: Diagnosis not present

## 2019-02-25 DIAGNOSIS — Z9981 Dependence on supplemental oxygen: Secondary | ICD-10-CM | POA: Diagnosis not present

## 2019-02-25 DIAGNOSIS — Z720 Tobacco use: Secondary | ICD-10-CM | POA: Diagnosis not present

## 2019-02-25 DIAGNOSIS — J441 Chronic obstructive pulmonary disease with (acute) exacerbation: Secondary | ICD-10-CM | POA: Diagnosis not present

## 2019-02-25 DIAGNOSIS — L570 Actinic keratosis: Secondary | ICD-10-CM | POA: Diagnosis not present

## 2019-02-27 DIAGNOSIS — I10 Essential (primary) hypertension: Secondary | ICD-10-CM | POA: Diagnosis not present

## 2019-02-27 DIAGNOSIS — R609 Edema, unspecified: Secondary | ICD-10-CM | POA: Diagnosis not present

## 2019-02-27 DIAGNOSIS — Z720 Tobacco use: Secondary | ICD-10-CM | POA: Diagnosis not present

## 2019-02-27 DIAGNOSIS — Z9981 Dependence on supplemental oxygen: Secondary | ICD-10-CM | POA: Diagnosis not present

## 2019-02-27 DIAGNOSIS — L570 Actinic keratosis: Secondary | ICD-10-CM | POA: Diagnosis not present

## 2019-02-27 DIAGNOSIS — J441 Chronic obstructive pulmonary disease with (acute) exacerbation: Secondary | ICD-10-CM | POA: Diagnosis not present

## 2019-02-28 DIAGNOSIS — R609 Edema, unspecified: Secondary | ICD-10-CM | POA: Diagnosis not present

## 2019-02-28 DIAGNOSIS — J441 Chronic obstructive pulmonary disease with (acute) exacerbation: Secondary | ICD-10-CM | POA: Diagnosis not present

## 2019-02-28 DIAGNOSIS — L570 Actinic keratosis: Secondary | ICD-10-CM | POA: Diagnosis not present

## 2019-02-28 DIAGNOSIS — Z720 Tobacco use: Secondary | ICD-10-CM | POA: Diagnosis not present

## 2019-02-28 DIAGNOSIS — I10 Essential (primary) hypertension: Secondary | ICD-10-CM | POA: Diagnosis not present

## 2019-02-28 DIAGNOSIS — Z9981 Dependence on supplemental oxygen: Secondary | ICD-10-CM | POA: Diagnosis not present

## 2019-03-02 DIAGNOSIS — L570 Actinic keratosis: Secondary | ICD-10-CM | POA: Diagnosis not present

## 2019-03-02 DIAGNOSIS — Z720 Tobacco use: Secondary | ICD-10-CM | POA: Diagnosis not present

## 2019-03-02 DIAGNOSIS — R609 Edema, unspecified: Secondary | ICD-10-CM | POA: Diagnosis not present

## 2019-03-02 DIAGNOSIS — I10 Essential (primary) hypertension: Secondary | ICD-10-CM | POA: Diagnosis not present

## 2019-03-02 DIAGNOSIS — Z9981 Dependence on supplemental oxygen: Secondary | ICD-10-CM | POA: Diagnosis not present

## 2019-03-02 DIAGNOSIS — J441 Chronic obstructive pulmonary disease with (acute) exacerbation: Secondary | ICD-10-CM | POA: Diagnosis not present

## 2019-03-04 DIAGNOSIS — Z9981 Dependence on supplemental oxygen: Secondary | ICD-10-CM | POA: Diagnosis not present

## 2019-03-04 DIAGNOSIS — L570 Actinic keratosis: Secondary | ICD-10-CM | POA: Diagnosis not present

## 2019-03-04 DIAGNOSIS — Z720 Tobacco use: Secondary | ICD-10-CM | POA: Diagnosis not present

## 2019-03-04 DIAGNOSIS — J441 Chronic obstructive pulmonary disease with (acute) exacerbation: Secondary | ICD-10-CM | POA: Diagnosis not present

## 2019-03-04 DIAGNOSIS — R609 Edema, unspecified: Secondary | ICD-10-CM | POA: Diagnosis not present

## 2019-03-04 DIAGNOSIS — I10 Essential (primary) hypertension: Secondary | ICD-10-CM | POA: Diagnosis not present

## 2019-03-05 DIAGNOSIS — J441 Chronic obstructive pulmonary disease with (acute) exacerbation: Secondary | ICD-10-CM | POA: Diagnosis not present

## 2019-03-05 DIAGNOSIS — R609 Edema, unspecified: Secondary | ICD-10-CM | POA: Diagnosis not present

## 2019-03-05 DIAGNOSIS — Z720 Tobacco use: Secondary | ICD-10-CM | POA: Diagnosis not present

## 2019-03-05 DIAGNOSIS — I10 Essential (primary) hypertension: Secondary | ICD-10-CM | POA: Diagnosis not present

## 2019-03-05 DIAGNOSIS — Z9981 Dependence on supplemental oxygen: Secondary | ICD-10-CM | POA: Diagnosis not present

## 2019-03-05 DIAGNOSIS — L570 Actinic keratosis: Secondary | ICD-10-CM | POA: Diagnosis not present

## 2019-03-06 DIAGNOSIS — L570 Actinic keratosis: Secondary | ICD-10-CM | POA: Diagnosis not present

## 2019-03-06 DIAGNOSIS — Z720 Tobacco use: Secondary | ICD-10-CM | POA: Diagnosis not present

## 2019-03-06 DIAGNOSIS — I10 Essential (primary) hypertension: Secondary | ICD-10-CM | POA: Diagnosis not present

## 2019-03-06 DIAGNOSIS — R609 Edema, unspecified: Secondary | ICD-10-CM | POA: Diagnosis not present

## 2019-03-06 DIAGNOSIS — J441 Chronic obstructive pulmonary disease with (acute) exacerbation: Secondary | ICD-10-CM | POA: Diagnosis not present

## 2019-03-06 DIAGNOSIS — Z9981 Dependence on supplemental oxygen: Secondary | ICD-10-CM | POA: Diagnosis not present

## 2019-03-09 DIAGNOSIS — J441 Chronic obstructive pulmonary disease with (acute) exacerbation: Secondary | ICD-10-CM | POA: Diagnosis not present

## 2019-03-09 DIAGNOSIS — Z720 Tobacco use: Secondary | ICD-10-CM | POA: Diagnosis not present

## 2019-03-09 DIAGNOSIS — R609 Edema, unspecified: Secondary | ICD-10-CM | POA: Diagnosis not present

## 2019-03-09 DIAGNOSIS — L570 Actinic keratosis: Secondary | ICD-10-CM | POA: Diagnosis not present

## 2019-03-09 DIAGNOSIS — Z9981 Dependence on supplemental oxygen: Secondary | ICD-10-CM | POA: Diagnosis not present

## 2019-03-09 DIAGNOSIS — I10 Essential (primary) hypertension: Secondary | ICD-10-CM | POA: Diagnosis not present

## 2019-03-11 DIAGNOSIS — Z720 Tobacco use: Secondary | ICD-10-CM | POA: Diagnosis not present

## 2019-03-11 DIAGNOSIS — J441 Chronic obstructive pulmonary disease with (acute) exacerbation: Secondary | ICD-10-CM | POA: Diagnosis not present

## 2019-03-11 DIAGNOSIS — L570 Actinic keratosis: Secondary | ICD-10-CM | POA: Diagnosis not present

## 2019-03-11 DIAGNOSIS — R609 Edema, unspecified: Secondary | ICD-10-CM | POA: Diagnosis not present

## 2019-03-11 DIAGNOSIS — Z9981 Dependence on supplemental oxygen: Secondary | ICD-10-CM | POA: Diagnosis not present

## 2019-03-11 DIAGNOSIS — I10 Essential (primary) hypertension: Secondary | ICD-10-CM | POA: Diagnosis not present

## 2019-03-13 DIAGNOSIS — R609 Edema, unspecified: Secondary | ICD-10-CM | POA: Diagnosis not present

## 2019-03-13 DIAGNOSIS — I10 Essential (primary) hypertension: Secondary | ICD-10-CM | POA: Diagnosis not present

## 2019-03-13 DIAGNOSIS — Z720 Tobacco use: Secondary | ICD-10-CM | POA: Diagnosis not present

## 2019-03-13 DIAGNOSIS — L570 Actinic keratosis: Secondary | ICD-10-CM | POA: Diagnosis not present

## 2019-03-13 DIAGNOSIS — J441 Chronic obstructive pulmonary disease with (acute) exacerbation: Secondary | ICD-10-CM | POA: Diagnosis not present

## 2019-03-13 DIAGNOSIS — Z9981 Dependence on supplemental oxygen: Secondary | ICD-10-CM | POA: Diagnosis not present

## 2019-03-15 DIAGNOSIS — R609 Edema, unspecified: Secondary | ICD-10-CM | POA: Diagnosis not present

## 2019-03-15 DIAGNOSIS — J441 Chronic obstructive pulmonary disease with (acute) exacerbation: Secondary | ICD-10-CM | POA: Diagnosis not present

## 2019-03-15 DIAGNOSIS — I10 Essential (primary) hypertension: Secondary | ICD-10-CM | POA: Diagnosis not present

## 2019-03-15 DIAGNOSIS — Z9981 Dependence on supplemental oxygen: Secondary | ICD-10-CM | POA: Diagnosis not present

## 2019-03-15 DIAGNOSIS — L570 Actinic keratosis: Secondary | ICD-10-CM | POA: Diagnosis not present

## 2019-03-15 DIAGNOSIS — Z720 Tobacco use: Secondary | ICD-10-CM | POA: Diagnosis not present

## 2019-04-17 DIAGNOSIS — Z515 Encounter for palliative care: Secondary | ICD-10-CM | POA: Diagnosis not present

## 2019-04-17 DIAGNOSIS — Z9981 Dependence on supplemental oxygen: Secondary | ICD-10-CM | POA: Diagnosis not present

## 2019-04-17 DIAGNOSIS — L570 Actinic keratosis: Secondary | ICD-10-CM | POA: Diagnosis not present

## 2019-04-17 DIAGNOSIS — J441 Chronic obstructive pulmonary disease with (acute) exacerbation: Secondary | ICD-10-CM | POA: Diagnosis not present

## 2019-04-17 DIAGNOSIS — I10 Essential (primary) hypertension: Secondary | ICD-10-CM | POA: Diagnosis not present

## 2019-04-17 DIAGNOSIS — R609 Edema, unspecified: Secondary | ICD-10-CM | POA: Diagnosis not present

## 2019-04-17 DIAGNOSIS — Z720 Tobacco use: Secondary | ICD-10-CM | POA: Diagnosis not present

## 2019-04-17 DIAGNOSIS — Z66 Do not resuscitate: Secondary | ICD-10-CM | POA: Diagnosis not present

## 2019-04-20 DIAGNOSIS — I10 Essential (primary) hypertension: Secondary | ICD-10-CM | POA: Diagnosis not present

## 2019-04-20 DIAGNOSIS — Z720 Tobacco use: Secondary | ICD-10-CM | POA: Diagnosis not present

## 2019-04-20 DIAGNOSIS — L570 Actinic keratosis: Secondary | ICD-10-CM | POA: Diagnosis not present

## 2019-04-20 DIAGNOSIS — J441 Chronic obstructive pulmonary disease with (acute) exacerbation: Secondary | ICD-10-CM | POA: Diagnosis not present

## 2019-04-20 DIAGNOSIS — R609 Edema, unspecified: Secondary | ICD-10-CM | POA: Diagnosis not present

## 2019-04-20 DIAGNOSIS — Z9981 Dependence on supplemental oxygen: Secondary | ICD-10-CM | POA: Diagnosis not present

## 2019-04-22 DIAGNOSIS — L570 Actinic keratosis: Secondary | ICD-10-CM | POA: Diagnosis not present

## 2019-04-22 DIAGNOSIS — R609 Edema, unspecified: Secondary | ICD-10-CM | POA: Diagnosis not present

## 2019-04-22 DIAGNOSIS — I10 Essential (primary) hypertension: Secondary | ICD-10-CM | POA: Diagnosis not present

## 2019-04-22 DIAGNOSIS — Z720 Tobacco use: Secondary | ICD-10-CM | POA: Diagnosis not present

## 2019-04-22 DIAGNOSIS — Z9981 Dependence on supplemental oxygen: Secondary | ICD-10-CM | POA: Diagnosis not present

## 2019-04-22 DIAGNOSIS — J441 Chronic obstructive pulmonary disease with (acute) exacerbation: Secondary | ICD-10-CM | POA: Diagnosis not present

## 2019-04-24 DIAGNOSIS — Z9981 Dependence on supplemental oxygen: Secondary | ICD-10-CM | POA: Diagnosis not present

## 2019-04-24 DIAGNOSIS — Z720 Tobacco use: Secondary | ICD-10-CM | POA: Diagnosis not present

## 2019-04-24 DIAGNOSIS — J441 Chronic obstructive pulmonary disease with (acute) exacerbation: Secondary | ICD-10-CM | POA: Diagnosis not present

## 2019-04-24 DIAGNOSIS — I10 Essential (primary) hypertension: Secondary | ICD-10-CM | POA: Diagnosis not present

## 2019-04-24 DIAGNOSIS — L570 Actinic keratosis: Secondary | ICD-10-CM | POA: Diagnosis not present

## 2019-04-24 DIAGNOSIS — R609 Edema, unspecified: Secondary | ICD-10-CM | POA: Diagnosis not present

## 2019-04-27 DIAGNOSIS — R609 Edema, unspecified: Secondary | ICD-10-CM | POA: Diagnosis not present

## 2019-04-27 DIAGNOSIS — I10 Essential (primary) hypertension: Secondary | ICD-10-CM | POA: Diagnosis not present

## 2019-04-27 DIAGNOSIS — Z9981 Dependence on supplemental oxygen: Secondary | ICD-10-CM | POA: Diagnosis not present

## 2019-04-27 DIAGNOSIS — L570 Actinic keratosis: Secondary | ICD-10-CM | POA: Diagnosis not present

## 2019-04-27 DIAGNOSIS — Z720 Tobacco use: Secondary | ICD-10-CM | POA: Diagnosis not present

## 2019-04-27 DIAGNOSIS — J441 Chronic obstructive pulmonary disease with (acute) exacerbation: Secondary | ICD-10-CM | POA: Diagnosis not present

## 2019-04-28 DIAGNOSIS — Z720 Tobacco use: Secondary | ICD-10-CM | POA: Diagnosis not present

## 2019-04-28 DIAGNOSIS — J441 Chronic obstructive pulmonary disease with (acute) exacerbation: Secondary | ICD-10-CM | POA: Diagnosis not present

## 2019-04-28 DIAGNOSIS — R609 Edema, unspecified: Secondary | ICD-10-CM | POA: Diagnosis not present

## 2019-04-28 DIAGNOSIS — I10 Essential (primary) hypertension: Secondary | ICD-10-CM | POA: Diagnosis not present

## 2019-04-28 DIAGNOSIS — Z9981 Dependence on supplemental oxygen: Secondary | ICD-10-CM | POA: Diagnosis not present

## 2019-04-28 DIAGNOSIS — L570 Actinic keratosis: Secondary | ICD-10-CM | POA: Diagnosis not present

## 2019-04-29 DIAGNOSIS — Z9981 Dependence on supplemental oxygen: Secondary | ICD-10-CM | POA: Diagnosis not present

## 2019-04-29 DIAGNOSIS — R609 Edema, unspecified: Secondary | ICD-10-CM | POA: Diagnosis not present

## 2019-04-29 DIAGNOSIS — Z720 Tobacco use: Secondary | ICD-10-CM | POA: Diagnosis not present

## 2019-04-29 DIAGNOSIS — I10 Essential (primary) hypertension: Secondary | ICD-10-CM | POA: Diagnosis not present

## 2019-04-29 DIAGNOSIS — L570 Actinic keratosis: Secondary | ICD-10-CM | POA: Diagnosis not present

## 2019-04-29 DIAGNOSIS — J441 Chronic obstructive pulmonary disease with (acute) exacerbation: Secondary | ICD-10-CM | POA: Diagnosis not present

## 2019-05-01 DIAGNOSIS — Z9981 Dependence on supplemental oxygen: Secondary | ICD-10-CM | POA: Diagnosis not present

## 2019-05-01 DIAGNOSIS — L570 Actinic keratosis: Secondary | ICD-10-CM | POA: Diagnosis not present

## 2019-05-01 DIAGNOSIS — I10 Essential (primary) hypertension: Secondary | ICD-10-CM | POA: Diagnosis not present

## 2019-05-01 DIAGNOSIS — J441 Chronic obstructive pulmonary disease with (acute) exacerbation: Secondary | ICD-10-CM | POA: Diagnosis not present

## 2019-05-01 DIAGNOSIS — R609 Edema, unspecified: Secondary | ICD-10-CM | POA: Diagnosis not present

## 2019-05-01 DIAGNOSIS — Z720 Tobacco use: Secondary | ICD-10-CM | POA: Diagnosis not present

## 2019-05-04 DIAGNOSIS — L570 Actinic keratosis: Secondary | ICD-10-CM | POA: Diagnosis not present

## 2019-05-04 DIAGNOSIS — J441 Chronic obstructive pulmonary disease with (acute) exacerbation: Secondary | ICD-10-CM | POA: Diagnosis not present

## 2019-05-04 DIAGNOSIS — Z9981 Dependence on supplemental oxygen: Secondary | ICD-10-CM | POA: Diagnosis not present

## 2019-05-04 DIAGNOSIS — Z720 Tobacco use: Secondary | ICD-10-CM | POA: Diagnosis not present

## 2019-05-04 DIAGNOSIS — R609 Edema, unspecified: Secondary | ICD-10-CM | POA: Diagnosis not present

## 2019-05-04 DIAGNOSIS — I10 Essential (primary) hypertension: Secondary | ICD-10-CM | POA: Diagnosis not present

## 2019-05-06 DIAGNOSIS — Z720 Tobacco use: Secondary | ICD-10-CM | POA: Diagnosis not present

## 2019-05-06 DIAGNOSIS — I10 Essential (primary) hypertension: Secondary | ICD-10-CM | POA: Diagnosis not present

## 2019-05-06 DIAGNOSIS — L570 Actinic keratosis: Secondary | ICD-10-CM | POA: Diagnosis not present

## 2019-05-06 DIAGNOSIS — J441 Chronic obstructive pulmonary disease with (acute) exacerbation: Secondary | ICD-10-CM | POA: Diagnosis not present

## 2019-05-06 DIAGNOSIS — Z9981 Dependence on supplemental oxygen: Secondary | ICD-10-CM | POA: Diagnosis not present

## 2019-05-06 DIAGNOSIS — R609 Edema, unspecified: Secondary | ICD-10-CM | POA: Diagnosis not present

## 2019-05-07 DIAGNOSIS — L570 Actinic keratosis: Secondary | ICD-10-CM | POA: Diagnosis not present

## 2019-05-07 DIAGNOSIS — J441 Chronic obstructive pulmonary disease with (acute) exacerbation: Secondary | ICD-10-CM | POA: Diagnosis not present

## 2019-05-07 DIAGNOSIS — Z720 Tobacco use: Secondary | ICD-10-CM | POA: Diagnosis not present

## 2019-05-07 DIAGNOSIS — R609 Edema, unspecified: Secondary | ICD-10-CM | POA: Diagnosis not present

## 2019-05-07 DIAGNOSIS — Z9981 Dependence on supplemental oxygen: Secondary | ICD-10-CM | POA: Diagnosis not present

## 2019-05-07 DIAGNOSIS — I10 Essential (primary) hypertension: Secondary | ICD-10-CM | POA: Diagnosis not present

## 2019-05-08 DIAGNOSIS — Z9981 Dependence on supplemental oxygen: Secondary | ICD-10-CM | POA: Diagnosis not present

## 2019-05-08 DIAGNOSIS — L570 Actinic keratosis: Secondary | ICD-10-CM | POA: Diagnosis not present

## 2019-05-08 DIAGNOSIS — Z720 Tobacco use: Secondary | ICD-10-CM | POA: Diagnosis not present

## 2019-05-08 DIAGNOSIS — R609 Edema, unspecified: Secondary | ICD-10-CM | POA: Diagnosis not present

## 2019-05-08 DIAGNOSIS — I10 Essential (primary) hypertension: Secondary | ICD-10-CM | POA: Diagnosis not present

## 2019-05-08 DIAGNOSIS — J441 Chronic obstructive pulmonary disease with (acute) exacerbation: Secondary | ICD-10-CM | POA: Diagnosis not present

## 2019-05-11 DIAGNOSIS — Z9981 Dependence on supplemental oxygen: Secondary | ICD-10-CM | POA: Diagnosis not present

## 2019-05-11 DIAGNOSIS — L570 Actinic keratosis: Secondary | ICD-10-CM | POA: Diagnosis not present

## 2019-05-11 DIAGNOSIS — I10 Essential (primary) hypertension: Secondary | ICD-10-CM | POA: Diagnosis not present

## 2019-05-11 DIAGNOSIS — J441 Chronic obstructive pulmonary disease with (acute) exacerbation: Secondary | ICD-10-CM | POA: Diagnosis not present

## 2019-05-11 DIAGNOSIS — Z720 Tobacco use: Secondary | ICD-10-CM | POA: Diagnosis not present

## 2019-05-11 DIAGNOSIS — R609 Edema, unspecified: Secondary | ICD-10-CM | POA: Diagnosis not present

## 2019-05-14 DIAGNOSIS — L570 Actinic keratosis: Secondary | ICD-10-CM | POA: Diagnosis not present

## 2019-05-14 DIAGNOSIS — I10 Essential (primary) hypertension: Secondary | ICD-10-CM | POA: Diagnosis not present

## 2019-05-14 DIAGNOSIS — Z9981 Dependence on supplemental oxygen: Secondary | ICD-10-CM | POA: Diagnosis not present

## 2019-05-14 DIAGNOSIS — J441 Chronic obstructive pulmonary disease with (acute) exacerbation: Secondary | ICD-10-CM | POA: Diagnosis not present

## 2019-05-14 DIAGNOSIS — Z720 Tobacco use: Secondary | ICD-10-CM | POA: Diagnosis not present

## 2019-05-14 DIAGNOSIS — R609 Edema, unspecified: Secondary | ICD-10-CM | POA: Diagnosis not present

## 2019-05-15 DIAGNOSIS — Z9981 Dependence on supplemental oxygen: Secondary | ICD-10-CM | POA: Diagnosis not present

## 2019-05-15 DIAGNOSIS — I10 Essential (primary) hypertension: Secondary | ICD-10-CM | POA: Diagnosis not present

## 2019-05-15 DIAGNOSIS — J441 Chronic obstructive pulmonary disease with (acute) exacerbation: Secondary | ICD-10-CM | POA: Diagnosis not present

## 2019-05-15 DIAGNOSIS — Z720 Tobacco use: Secondary | ICD-10-CM | POA: Diagnosis not present

## 2019-05-15 DIAGNOSIS — R609 Edema, unspecified: Secondary | ICD-10-CM | POA: Diagnosis not present

## 2019-05-15 DIAGNOSIS — L570 Actinic keratosis: Secondary | ICD-10-CM | POA: Diagnosis not present

## 2019-05-18 DIAGNOSIS — Z66 Do not resuscitate: Secondary | ICD-10-CM | POA: Diagnosis not present

## 2019-05-18 DIAGNOSIS — J441 Chronic obstructive pulmonary disease with (acute) exacerbation: Secondary | ICD-10-CM | POA: Diagnosis not present

## 2019-05-18 DIAGNOSIS — Z720 Tobacco use: Secondary | ICD-10-CM | POA: Diagnosis not present

## 2019-05-18 DIAGNOSIS — R609 Edema, unspecified: Secondary | ICD-10-CM | POA: Diagnosis not present

## 2019-05-18 DIAGNOSIS — Z9981 Dependence on supplemental oxygen: Secondary | ICD-10-CM | POA: Diagnosis not present

## 2019-05-18 DIAGNOSIS — L570 Actinic keratosis: Secondary | ICD-10-CM | POA: Diagnosis not present

## 2019-05-18 DIAGNOSIS — Z515 Encounter for palliative care: Secondary | ICD-10-CM | POA: Diagnosis not present

## 2019-05-18 DIAGNOSIS — I10 Essential (primary) hypertension: Secondary | ICD-10-CM | POA: Diagnosis not present

## 2019-05-19 DIAGNOSIS — I10 Essential (primary) hypertension: Secondary | ICD-10-CM | POA: Diagnosis not present

## 2019-05-19 DIAGNOSIS — R609 Edema, unspecified: Secondary | ICD-10-CM | POA: Diagnosis not present

## 2019-05-19 DIAGNOSIS — Z720 Tobacco use: Secondary | ICD-10-CM | POA: Diagnosis not present

## 2019-05-19 DIAGNOSIS — Z9981 Dependence on supplemental oxygen: Secondary | ICD-10-CM | POA: Diagnosis not present

## 2019-05-19 DIAGNOSIS — J441 Chronic obstructive pulmonary disease with (acute) exacerbation: Secondary | ICD-10-CM | POA: Diagnosis not present

## 2019-05-19 DIAGNOSIS — L570 Actinic keratosis: Secondary | ICD-10-CM | POA: Diagnosis not present

## 2019-05-20 DIAGNOSIS — I10 Essential (primary) hypertension: Secondary | ICD-10-CM | POA: Diagnosis not present

## 2019-05-20 DIAGNOSIS — Z9981 Dependence on supplemental oxygen: Secondary | ICD-10-CM | POA: Diagnosis not present

## 2019-05-20 DIAGNOSIS — L570 Actinic keratosis: Secondary | ICD-10-CM | POA: Diagnosis not present

## 2019-05-20 DIAGNOSIS — R609 Edema, unspecified: Secondary | ICD-10-CM | POA: Diagnosis not present

## 2019-05-20 DIAGNOSIS — Z720 Tobacco use: Secondary | ICD-10-CM | POA: Diagnosis not present

## 2019-05-20 DIAGNOSIS — J441 Chronic obstructive pulmonary disease with (acute) exacerbation: Secondary | ICD-10-CM | POA: Diagnosis not present

## 2019-05-21 DIAGNOSIS — J441 Chronic obstructive pulmonary disease with (acute) exacerbation: Secondary | ICD-10-CM | POA: Diagnosis not present

## 2019-05-21 DIAGNOSIS — Z720 Tobacco use: Secondary | ICD-10-CM | POA: Diagnosis not present

## 2019-05-21 DIAGNOSIS — L570 Actinic keratosis: Secondary | ICD-10-CM | POA: Diagnosis not present

## 2019-05-21 DIAGNOSIS — R609 Edema, unspecified: Secondary | ICD-10-CM | POA: Diagnosis not present

## 2019-05-21 DIAGNOSIS — I10 Essential (primary) hypertension: Secondary | ICD-10-CM | POA: Diagnosis not present

## 2019-05-21 DIAGNOSIS — Z9981 Dependence on supplemental oxygen: Secondary | ICD-10-CM | POA: Diagnosis not present

## 2019-05-22 DIAGNOSIS — Z720 Tobacco use: Secondary | ICD-10-CM | POA: Diagnosis not present

## 2019-05-22 DIAGNOSIS — L570 Actinic keratosis: Secondary | ICD-10-CM | POA: Diagnosis not present

## 2019-05-22 DIAGNOSIS — J441 Chronic obstructive pulmonary disease with (acute) exacerbation: Secondary | ICD-10-CM | POA: Diagnosis not present

## 2019-05-22 DIAGNOSIS — R609 Edema, unspecified: Secondary | ICD-10-CM | POA: Diagnosis not present

## 2019-05-22 DIAGNOSIS — Z9981 Dependence on supplemental oxygen: Secondary | ICD-10-CM | POA: Diagnosis not present

## 2019-05-22 DIAGNOSIS — I10 Essential (primary) hypertension: Secondary | ICD-10-CM | POA: Diagnosis not present

## 2019-05-25 DIAGNOSIS — Z720 Tobacco use: Secondary | ICD-10-CM | POA: Diagnosis not present

## 2019-05-25 DIAGNOSIS — J441 Chronic obstructive pulmonary disease with (acute) exacerbation: Secondary | ICD-10-CM | POA: Diagnosis not present

## 2019-05-25 DIAGNOSIS — I10 Essential (primary) hypertension: Secondary | ICD-10-CM | POA: Diagnosis not present

## 2019-05-25 DIAGNOSIS — L570 Actinic keratosis: Secondary | ICD-10-CM | POA: Diagnosis not present

## 2019-05-25 DIAGNOSIS — R609 Edema, unspecified: Secondary | ICD-10-CM | POA: Diagnosis not present

## 2019-05-25 DIAGNOSIS — Z9981 Dependence on supplemental oxygen: Secondary | ICD-10-CM | POA: Diagnosis not present

## 2019-05-27 DIAGNOSIS — I10 Essential (primary) hypertension: Secondary | ICD-10-CM | POA: Diagnosis not present

## 2019-05-27 DIAGNOSIS — Z9981 Dependence on supplemental oxygen: Secondary | ICD-10-CM | POA: Diagnosis not present

## 2019-05-27 DIAGNOSIS — J441 Chronic obstructive pulmonary disease with (acute) exacerbation: Secondary | ICD-10-CM | POA: Diagnosis not present

## 2019-05-27 DIAGNOSIS — L570 Actinic keratosis: Secondary | ICD-10-CM | POA: Diagnosis not present

## 2019-05-27 DIAGNOSIS — Z720 Tobacco use: Secondary | ICD-10-CM | POA: Diagnosis not present

## 2019-05-27 DIAGNOSIS — R609 Edema, unspecified: Secondary | ICD-10-CM | POA: Diagnosis not present

## 2019-05-28 DIAGNOSIS — Z9981 Dependence on supplemental oxygen: Secondary | ICD-10-CM | POA: Diagnosis not present

## 2019-05-28 DIAGNOSIS — Z720 Tobacco use: Secondary | ICD-10-CM | POA: Diagnosis not present

## 2019-05-28 DIAGNOSIS — I10 Essential (primary) hypertension: Secondary | ICD-10-CM | POA: Diagnosis not present

## 2019-05-28 DIAGNOSIS — J441 Chronic obstructive pulmonary disease with (acute) exacerbation: Secondary | ICD-10-CM | POA: Diagnosis not present

## 2019-05-28 DIAGNOSIS — L570 Actinic keratosis: Secondary | ICD-10-CM | POA: Diagnosis not present

## 2019-05-28 DIAGNOSIS — R609 Edema, unspecified: Secondary | ICD-10-CM | POA: Diagnosis not present

## 2019-05-31 DIAGNOSIS — Z9981 Dependence on supplemental oxygen: Secondary | ICD-10-CM | POA: Diagnosis not present

## 2019-05-31 DIAGNOSIS — I10 Essential (primary) hypertension: Secondary | ICD-10-CM | POA: Diagnosis not present

## 2019-05-31 DIAGNOSIS — L570 Actinic keratosis: Secondary | ICD-10-CM | POA: Diagnosis not present

## 2019-05-31 DIAGNOSIS — J441 Chronic obstructive pulmonary disease with (acute) exacerbation: Secondary | ICD-10-CM | POA: Diagnosis not present

## 2019-05-31 DIAGNOSIS — R609 Edema, unspecified: Secondary | ICD-10-CM | POA: Diagnosis not present

## 2019-05-31 DIAGNOSIS — Z720 Tobacco use: Secondary | ICD-10-CM | POA: Diagnosis not present

## 2019-06-01 DIAGNOSIS — Z720 Tobacco use: Secondary | ICD-10-CM | POA: Diagnosis not present

## 2019-06-01 DIAGNOSIS — Z9981 Dependence on supplemental oxygen: Secondary | ICD-10-CM | POA: Diagnosis not present

## 2019-06-01 DIAGNOSIS — R609 Edema, unspecified: Secondary | ICD-10-CM | POA: Diagnosis not present

## 2019-06-01 DIAGNOSIS — I10 Essential (primary) hypertension: Secondary | ICD-10-CM | POA: Diagnosis not present

## 2019-06-01 DIAGNOSIS — J441 Chronic obstructive pulmonary disease with (acute) exacerbation: Secondary | ICD-10-CM | POA: Diagnosis not present

## 2019-06-01 DIAGNOSIS — L570 Actinic keratosis: Secondary | ICD-10-CM | POA: Diagnosis not present

## 2019-06-02 DIAGNOSIS — R609 Edema, unspecified: Secondary | ICD-10-CM | POA: Diagnosis not present

## 2019-06-02 DIAGNOSIS — L570 Actinic keratosis: Secondary | ICD-10-CM | POA: Diagnosis not present

## 2019-06-02 DIAGNOSIS — J441 Chronic obstructive pulmonary disease with (acute) exacerbation: Secondary | ICD-10-CM | POA: Diagnosis not present

## 2019-06-02 DIAGNOSIS — I10 Essential (primary) hypertension: Secondary | ICD-10-CM | POA: Diagnosis not present

## 2019-06-02 DIAGNOSIS — Z720 Tobacco use: Secondary | ICD-10-CM | POA: Diagnosis not present

## 2019-06-02 DIAGNOSIS — Z9981 Dependence on supplemental oxygen: Secondary | ICD-10-CM | POA: Diagnosis not present

## 2019-06-03 DIAGNOSIS — R609 Edema, unspecified: Secondary | ICD-10-CM | POA: Diagnosis not present

## 2019-06-03 DIAGNOSIS — J441 Chronic obstructive pulmonary disease with (acute) exacerbation: Secondary | ICD-10-CM | POA: Diagnosis not present

## 2019-06-03 DIAGNOSIS — L570 Actinic keratosis: Secondary | ICD-10-CM | POA: Diagnosis not present

## 2019-06-03 DIAGNOSIS — Z9981 Dependence on supplemental oxygen: Secondary | ICD-10-CM | POA: Diagnosis not present

## 2019-06-03 DIAGNOSIS — Z720 Tobacco use: Secondary | ICD-10-CM | POA: Diagnosis not present

## 2019-06-03 DIAGNOSIS — I10 Essential (primary) hypertension: Secondary | ICD-10-CM | POA: Diagnosis not present

## 2019-06-05 DIAGNOSIS — Z720 Tobacco use: Secondary | ICD-10-CM | POA: Diagnosis not present

## 2019-06-05 DIAGNOSIS — R609 Edema, unspecified: Secondary | ICD-10-CM | POA: Diagnosis not present

## 2019-06-05 DIAGNOSIS — I10 Essential (primary) hypertension: Secondary | ICD-10-CM | POA: Diagnosis not present

## 2019-06-05 DIAGNOSIS — J441 Chronic obstructive pulmonary disease with (acute) exacerbation: Secondary | ICD-10-CM | POA: Diagnosis not present

## 2019-06-05 DIAGNOSIS — L570 Actinic keratosis: Secondary | ICD-10-CM | POA: Diagnosis not present

## 2019-06-05 DIAGNOSIS — Z9981 Dependence on supplemental oxygen: Secondary | ICD-10-CM | POA: Diagnosis not present

## 2019-06-06 DIAGNOSIS — Z720 Tobacco use: Secondary | ICD-10-CM | POA: Diagnosis not present

## 2019-06-06 DIAGNOSIS — R609 Edema, unspecified: Secondary | ICD-10-CM | POA: Diagnosis not present

## 2019-06-06 DIAGNOSIS — Z9981 Dependence on supplemental oxygen: Secondary | ICD-10-CM | POA: Diagnosis not present

## 2019-06-06 DIAGNOSIS — J441 Chronic obstructive pulmonary disease with (acute) exacerbation: Secondary | ICD-10-CM | POA: Diagnosis not present

## 2019-06-06 DIAGNOSIS — I10 Essential (primary) hypertension: Secondary | ICD-10-CM | POA: Diagnosis not present

## 2019-06-06 DIAGNOSIS — L570 Actinic keratosis: Secondary | ICD-10-CM | POA: Diagnosis not present

## 2019-06-07 DIAGNOSIS — Z720 Tobacco use: Secondary | ICD-10-CM | POA: Diagnosis not present

## 2019-06-07 DIAGNOSIS — J441 Chronic obstructive pulmonary disease with (acute) exacerbation: Secondary | ICD-10-CM | POA: Diagnosis not present

## 2019-06-07 DIAGNOSIS — I10 Essential (primary) hypertension: Secondary | ICD-10-CM | POA: Diagnosis not present

## 2019-06-07 DIAGNOSIS — R609 Edema, unspecified: Secondary | ICD-10-CM | POA: Diagnosis not present

## 2019-06-07 DIAGNOSIS — Z9981 Dependence on supplemental oxygen: Secondary | ICD-10-CM | POA: Diagnosis not present

## 2019-06-07 DIAGNOSIS — L570 Actinic keratosis: Secondary | ICD-10-CM | POA: Diagnosis not present

## 2019-06-08 DIAGNOSIS — J441 Chronic obstructive pulmonary disease with (acute) exacerbation: Secondary | ICD-10-CM | POA: Diagnosis not present

## 2019-06-08 DIAGNOSIS — L570 Actinic keratosis: Secondary | ICD-10-CM | POA: Diagnosis not present

## 2019-06-08 DIAGNOSIS — Z9981 Dependence on supplemental oxygen: Secondary | ICD-10-CM | POA: Diagnosis not present

## 2019-06-08 DIAGNOSIS — R609 Edema, unspecified: Secondary | ICD-10-CM | POA: Diagnosis not present

## 2019-06-08 DIAGNOSIS — I10 Essential (primary) hypertension: Secondary | ICD-10-CM | POA: Diagnosis not present

## 2019-06-08 DIAGNOSIS — Z720 Tobacco use: Secondary | ICD-10-CM | POA: Diagnosis not present

## 2019-06-09 DIAGNOSIS — J441 Chronic obstructive pulmonary disease with (acute) exacerbation: Secondary | ICD-10-CM | POA: Diagnosis not present

## 2019-06-09 DIAGNOSIS — L570 Actinic keratosis: Secondary | ICD-10-CM | POA: Diagnosis not present

## 2019-06-09 DIAGNOSIS — R609 Edema, unspecified: Secondary | ICD-10-CM | POA: Diagnosis not present

## 2019-06-09 DIAGNOSIS — I10 Essential (primary) hypertension: Secondary | ICD-10-CM | POA: Diagnosis not present

## 2019-06-09 DIAGNOSIS — Z720 Tobacco use: Secondary | ICD-10-CM | POA: Diagnosis not present

## 2019-06-09 DIAGNOSIS — Z9981 Dependence on supplemental oxygen: Secondary | ICD-10-CM | POA: Diagnosis not present

## 2019-06-10 DIAGNOSIS — J441 Chronic obstructive pulmonary disease with (acute) exacerbation: Secondary | ICD-10-CM | POA: Diagnosis not present

## 2019-06-10 DIAGNOSIS — L570 Actinic keratosis: Secondary | ICD-10-CM | POA: Diagnosis not present

## 2019-06-10 DIAGNOSIS — Z720 Tobacco use: Secondary | ICD-10-CM | POA: Diagnosis not present

## 2019-06-10 DIAGNOSIS — Z9981 Dependence on supplemental oxygen: Secondary | ICD-10-CM | POA: Diagnosis not present

## 2019-06-10 DIAGNOSIS — I10 Essential (primary) hypertension: Secondary | ICD-10-CM | POA: Diagnosis not present

## 2019-06-10 DIAGNOSIS — R609 Edema, unspecified: Secondary | ICD-10-CM | POA: Diagnosis not present

## 2019-06-11 DIAGNOSIS — I10 Essential (primary) hypertension: Secondary | ICD-10-CM | POA: Diagnosis not present

## 2019-06-11 DIAGNOSIS — J441 Chronic obstructive pulmonary disease with (acute) exacerbation: Secondary | ICD-10-CM | POA: Diagnosis not present

## 2019-06-11 DIAGNOSIS — L570 Actinic keratosis: Secondary | ICD-10-CM | POA: Diagnosis not present

## 2019-06-11 DIAGNOSIS — Z720 Tobacco use: Secondary | ICD-10-CM | POA: Diagnosis not present

## 2019-06-11 DIAGNOSIS — R609 Edema, unspecified: Secondary | ICD-10-CM | POA: Diagnosis not present

## 2019-06-11 DIAGNOSIS — Z9981 Dependence on supplemental oxygen: Secondary | ICD-10-CM | POA: Diagnosis not present

## 2019-06-12 DIAGNOSIS — Z720 Tobacco use: Secondary | ICD-10-CM | POA: Diagnosis not present

## 2019-06-12 DIAGNOSIS — I10 Essential (primary) hypertension: Secondary | ICD-10-CM | POA: Diagnosis not present

## 2019-06-12 DIAGNOSIS — R609 Edema, unspecified: Secondary | ICD-10-CM | POA: Diagnosis not present

## 2019-06-12 DIAGNOSIS — L570 Actinic keratosis: Secondary | ICD-10-CM | POA: Diagnosis not present

## 2019-06-12 DIAGNOSIS — J441 Chronic obstructive pulmonary disease with (acute) exacerbation: Secondary | ICD-10-CM | POA: Diagnosis not present

## 2019-06-12 DIAGNOSIS — Z9981 Dependence on supplemental oxygen: Secondary | ICD-10-CM | POA: Diagnosis not present

## 2019-06-15 DEATH — deceased

## 2020-09-08 IMAGING — DX CHEST - 2 VIEW
2 series · 2 of 2 positions shown · non-contrast
Comparison: 10/12/2018 chest x-ray, 11/28/2015 CT

CLINICAL DATA: Worsening SOB. Hx centrilobular emphysema/COPD,
asthma, HTN.

EXAM:
CHEST - 2 VIEW

[chest pa]
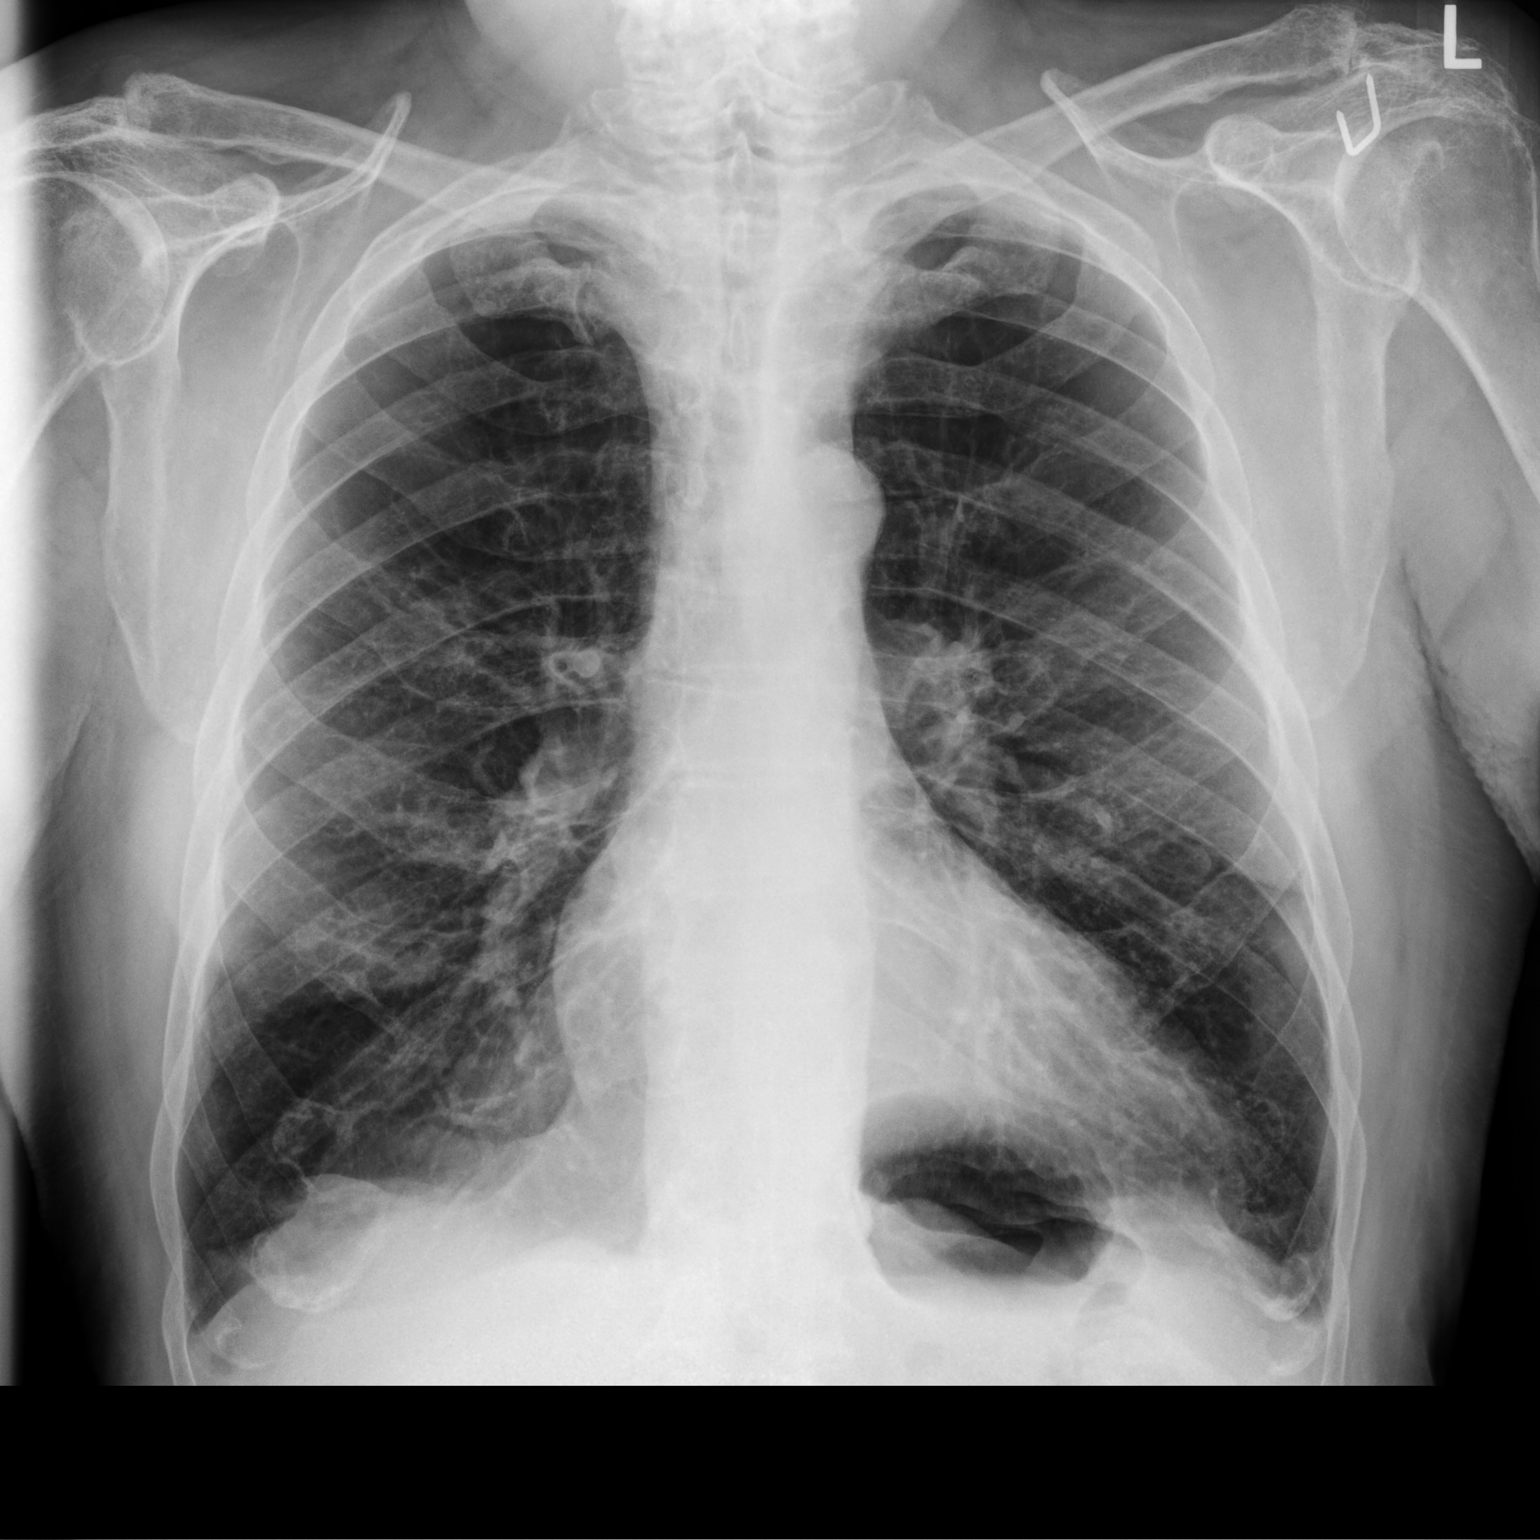

[chest lat]
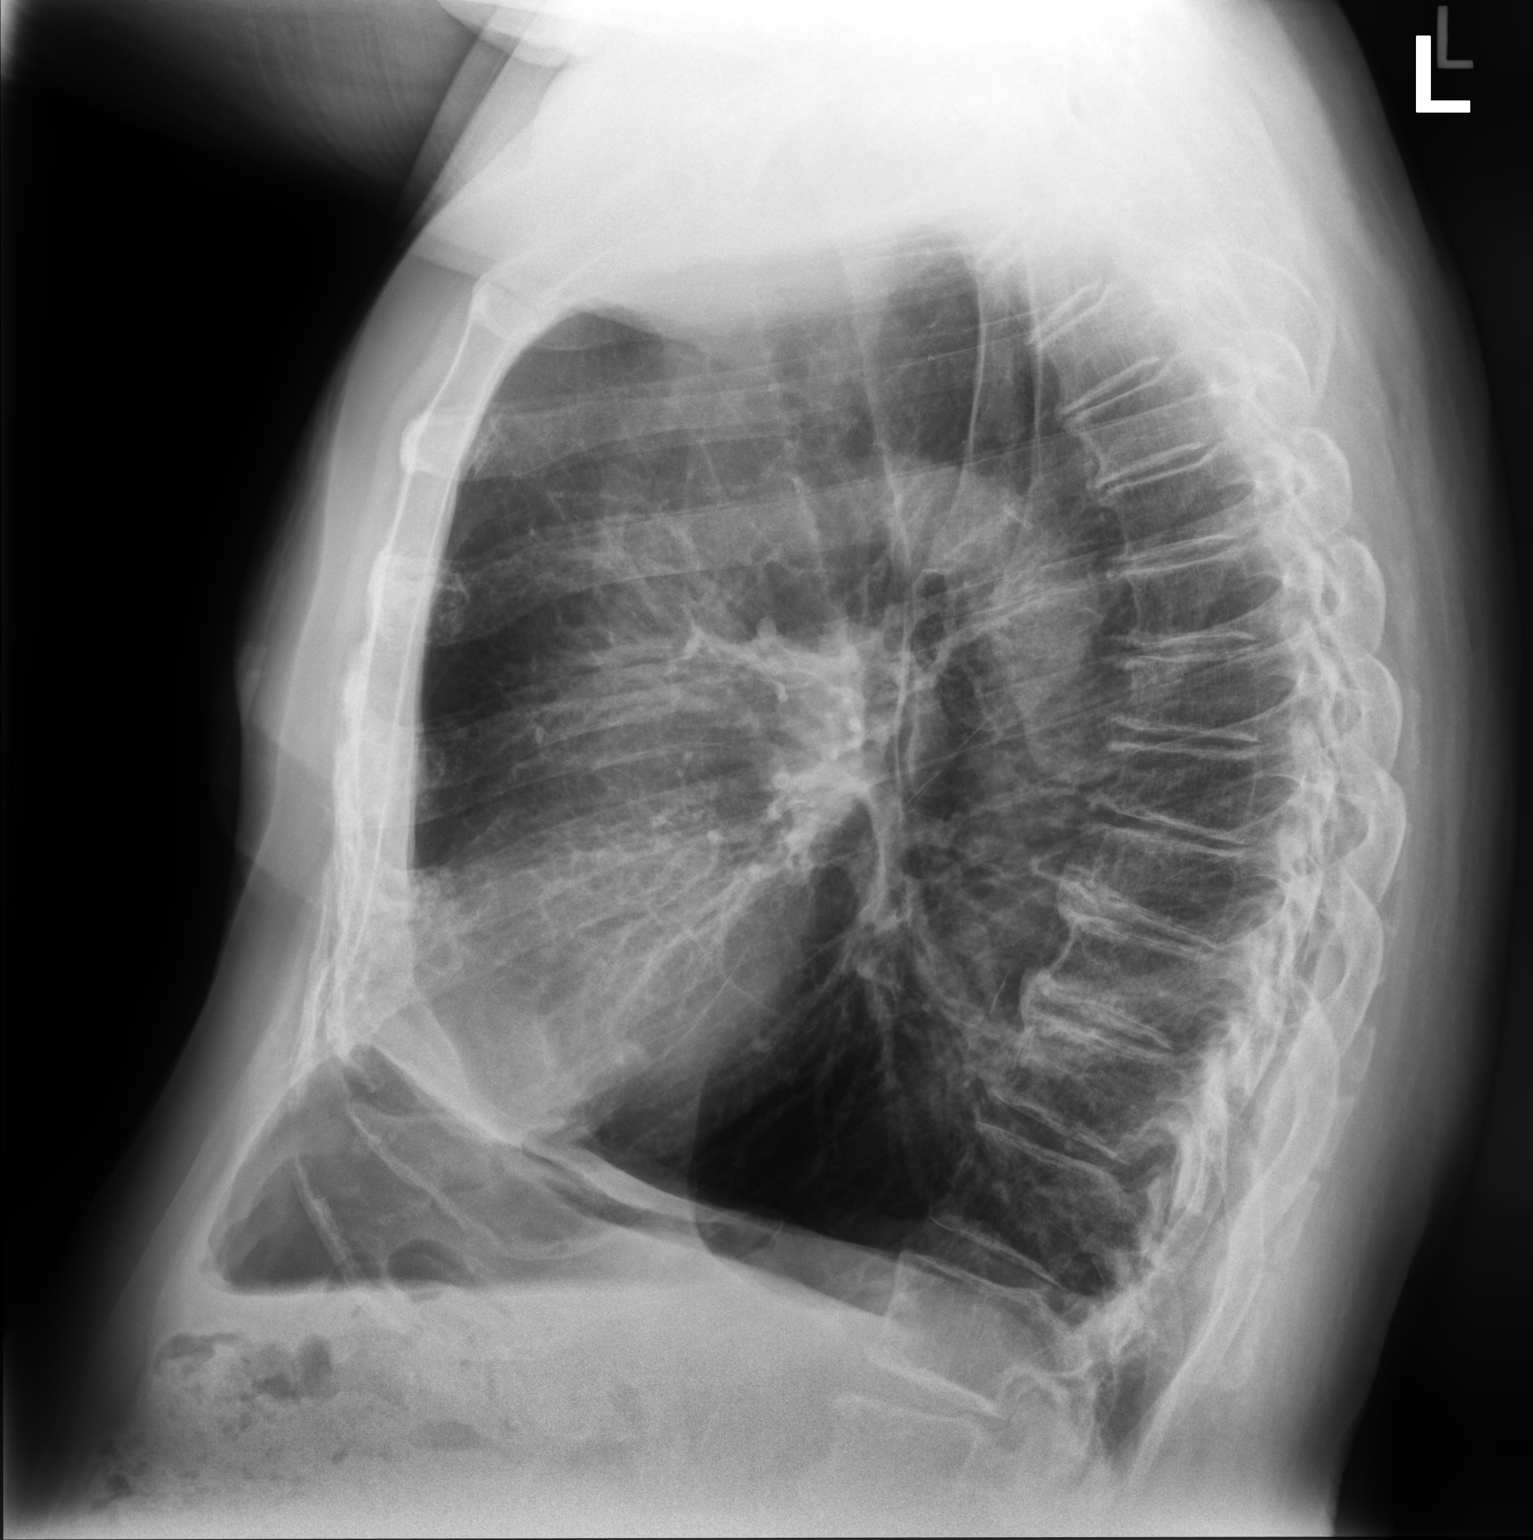

[2 of 2 positions shown; findings below may reference images not displayed]

FINDINGS: Lungs are markedly hyperinflated. There is perihilar peribronchial
thickening. No focal consolidations or pleural effusions. Moderate
degenerative changes in the midthoracic spine.
IMPRESSION: 1. Hyperinflation and bronchitic changes.
2. No focal acute pulmonary abnormality.
